# Patient Record
Sex: Male | Born: 1970 | Race: White | Hispanic: No | Marital: Single | Smoking: Former smoker
Health system: Southern US, Community
[De-identification: ages and names within clinical notes are randomized; demographics above are authoritative.]

## PROBLEM LIST (undated history)

## (undated) DIAGNOSIS — F419 Anxiety disorder, unspecified: Secondary | ICD-10-CM

## (undated) DIAGNOSIS — E7800 Pure hypercholesterolemia, unspecified: Secondary | ICD-10-CM

## (undated) DIAGNOSIS — E119 Type 2 diabetes mellitus without complications: Secondary | ICD-10-CM

## (undated) DIAGNOSIS — D649 Anemia, unspecified: Secondary | ICD-10-CM

## (undated) DIAGNOSIS — I2510 Atherosclerotic heart disease of native coronary artery without angina pectoris: Secondary | ICD-10-CM

## (undated) DIAGNOSIS — E785 Hyperlipidemia, unspecified: Secondary | ICD-10-CM

## (undated) DIAGNOSIS — K219 Gastro-esophageal reflux disease without esophagitis: Secondary | ICD-10-CM

## (undated) DIAGNOSIS — G629 Polyneuropathy, unspecified: Secondary | ICD-10-CM

## (undated) DIAGNOSIS — R6889 Other general symptoms and signs: Secondary | ICD-10-CM

## (undated) DIAGNOSIS — I252 Old myocardial infarction: Secondary | ICD-10-CM

## (undated) HISTORY — DX: Pure hypercholesterolemia, unspecified: E78.00

## (undated) HISTORY — DX: Polyneuropathy, unspecified: G62.9

## (undated) HISTORY — DX: Hyperlipidemia, unspecified: E78.5

## (undated) HISTORY — DX: Gastro-esophageal reflux disease without esophagitis: K21.9

## (undated) HISTORY — DX: Old myocardial infarction: I25.2

## (undated) HISTORY — DX: Other general symptoms and signs: R68.89

## (undated) HISTORY — DX: Anemia, unspecified: D64.9

## (undated) HISTORY — DX: Type 2 diabetes mellitus without complications: E11.9

## (undated) HISTORY — PX: ROTATOR CUFF REPAIR: SHX139

## (undated) HISTORY — DX: Anxiety disorder, unspecified: F41.9

---

## 2001-03-15 ENCOUNTER — Encounter: Payer: Self-pay | Admitting: Otolaryngology

## 2001-03-15 ENCOUNTER — Ambulatory Visit (HOSPITAL_COMMUNITY): Admission: RE | Admit: 2001-03-15 | Discharge: 2001-03-15 | Payer: Self-pay | Admitting: Otolaryngology

## 2003-01-03 ENCOUNTER — Emergency Department (HOSPITAL_COMMUNITY): Admission: EM | Admit: 2003-01-03 | Discharge: 2003-01-03 | Payer: Self-pay | Admitting: Emergency Medicine

## 2003-01-21 ENCOUNTER — Encounter: Admission: RE | Admit: 2003-01-21 | Discharge: 2003-04-21 | Payer: Self-pay | Admitting: Family Medicine

## 2003-03-29 ENCOUNTER — Encounter: Admission: RE | Admit: 2003-03-29 | Discharge: 2003-03-29 | Payer: Self-pay | Admitting: Family Medicine

## 2003-04-22 ENCOUNTER — Encounter: Admission: RE | Admit: 2003-04-22 | Discharge: 2003-04-29 | Payer: Self-pay | Admitting: Family Medicine

## 2005-04-29 ENCOUNTER — Ambulatory Visit (HOSPITAL_BASED_OUTPATIENT_CLINIC_OR_DEPARTMENT_OTHER): Admission: RE | Admit: 2005-04-29 | Discharge: 2005-04-29 | Payer: Self-pay | Admitting: Otolaryngology

## 2008-02-15 HISTORY — PX: ROTATOR CUFF REPAIR: SHX139

## 2010-11-08 ENCOUNTER — Ambulatory Visit (HOSPITAL_BASED_OUTPATIENT_CLINIC_OR_DEPARTMENT_OTHER)
Admission: RE | Admit: 2010-11-08 | Discharge: 2010-11-08 | Disposition: A | Discharge status: Home / Self Care | Payer: BC Managed Care – PPO | Source: Ambulatory Visit | Attending: Orthopedic Surgery | Admitting: Orthopedic Surgery

## 2010-11-08 DIAGNOSIS — S43439A Superior glenoid labrum lesion of unspecified shoulder, initial encounter: Secondary | ICD-10-CM | POA: Insufficient documentation

## 2010-11-08 DIAGNOSIS — Z5333 Arthroscopic surgical procedure converted to open procedure: Secondary | ICD-10-CM | POA: Insufficient documentation

## 2010-11-08 DIAGNOSIS — Z01812 Encounter for preprocedural laboratory examination: Secondary | ICD-10-CM | POA: Insufficient documentation

## 2010-11-08 DIAGNOSIS — X58XXXA Exposure to other specified factors, initial encounter: Secondary | ICD-10-CM | POA: Insufficient documentation

## 2010-11-09 LAB — POCT HEMOGLOBIN-HEMACUE: Hemoglobin: 16.7 g/dL (ref 13.0–17.0)

## 2010-11-09 NOTE — Op Note (Signed)
NAMEPAPA, PIERCEFIELD NO.:  [id number]  MEDICAL RECORD NO.:  [id number]  LOCATION:                                 FACILITY:  PHYSICIAN:  Jones Broom, MD         DATE OF BIRTH:  DATE OF PROCEDURE:  11/08/2010 DATE OF DISCHARGE:                              OPERATIVE REPORT   PREOPERATIVE DIAGNOSIS:  Right shoulder superior labral/biceps tear.  POSTOPERATIVE DIAGNOSIS:  Right shoulder superior labral anterior to posterior tear.  PROCEDURE PERFORMED: 1. Right shoulder arthroscopic biceps tenodesis with debridement of     superior labral tear. 2. Open subpectoral biceps tenodesis right shoulder.  ATTENDING SURGEON:  Berline Lopes, MD  ASSISTANT:  None.  ANESTHESIA:  GETA with preoperative interscalene block.  COMPLICATIONS:  None.  DRAINS:  None.  SPECIMENS:  None.  ESTIMATED BLOOD LOSS:  Minimal.  INDICATIONS FOR SURGERY:  The patient is a 40 year old male who presented in March 2012, with right shoulder pain.  Pain has been going on over a year now.  He had excellent relief of his symptoms with an intra-articular injection and physical exam was classic for superior labral pathology.  After failing nonoperative management, he wished to go forward with surgical management understanding risks, benefits and alternatives of the procedure.  We elected to go forgo the MRI given the classic appearance on exam, understanding that he may need further treatment depending on findings at arthroscopy.  OPERATIVE FINDINGS:  Examination under anesthesia demonstrated no stiffness or instability.  Diagnostic arthroscopy revealed an extensive type 2 SLAP extending anterior-posterior with elevation of the biceps root and inflammation of this region with some changes on the superior most aspect of the cartilage on the glenoid rim.  The biceps itself was healthy appearing. No loose bodies were noted.  The rotator cuff was intact.  I did look in the  subacromial space as well rotator cuff was completely intact with no evidence for impingement on the coracoacromial ligament.  I performed arthroscopic biceps tenotomy and then opened subpectoral biceps tenodesis through bone tunnels.  PROCEDURE:  The patient was identified in the preoperative holding area where I personally marked the operative site after verifying site, side, and procedure with the patient.  He was taken back to the operating room after interscalene block was given by the attending anesthesiologist. In the operating room, he had general anesthesia and was placed in a beach-chair position with all extremities carefully padded in position. He did receive IV antibiotics preoperatively.  The right upper extremity was prepped and draped in a standard sterile fashion after examination under anesthesia.  Appropriate time-out procedure was carried out.  A standard posterior portal was established and the arthroscope was introduced into the joint.  An anterior portal was established with needle localization.  A probe was used to enter portal for diagnostic arthroscopy.  He was noted to have extensive superior labral tear falling into the joint.  The superior surface of the glenoid tubercle was denuded and had some small changes on the upper cartilage as well. Given the findings which were expected, I performed the biceps tenotomy through the anterior portal with a large  biter and then cleaned up the remaining labrum with a shaver.  I also shaved the border of the articular cartilage down to a stable base.  At the conclusion of the debridement, the rim was stable and not subluxating into the joint.  Not feel that any labral repair was necessary with the biceps tenodesis from the remainder of the diagnostic arthroscopy.  The subscapularis was completely intact.  Glenohumeral joint surfaces were intact.  The labrum anterior and posterior was intact and no loose bodies were noted.   The undersurface of the rotator cuff anterior and posterior was intact.  I then placed the scope into the subacromial space and a small lateral portal under needle localization.  Introduced shaver into the joint and did a gentle bursectomy to examine the bursal side of the rotator cuff, which was completely intact, mild fraying anteriorly.  No significant fraying or tearing of the coracoacromial ligament.  The scope was then removed and approximately 3-cm incision was made centered over the inferior border of the pectoralis major.  Dissection was carried down to the interval between the pectoralis major and short head of the biceps. Through the center, I was able to retrieve the long head of the biceps and prepare the inferior bicipital groove for insertion.  I used a FiberLoop suture to prepare the tendon and excised and discarded the excess tendon after appropriate tensioning.  This was at approximately the musculotendinous junction.  A drill that was selected, which was slightly smaller than the prepared tendon for the proximal hole, which was drilled in the bicipital groove and an approximately 3-mm drill was used for the distal hole 1.5 cm away.  The crescent suture lasso was then used through the distal hole with the suture retrieved proximally, which was then used in a loop to pass the sutures from the biceps tendon down and through the tunnels.  One suture was retrieved medial and lateral to the tendon and the tendon was then dunked into the proximal hole with approximately 1.5 cm of the tendon inserting into the intramedullary canal.  I then tied the suture over top of the biceps tendon and cut the suture.  Excellent tension was noted.  I then copiously irrigated the wound and subsequently closed in layers with 2-0 Vicryl in deep dermal layer, 4-0 Monocryl for skin closure, and Dermabond on the skin.  I then closed the arthroscopic portals with 4-0 Monocryl sutures.  Sterile  dressings were then applied including Xeroform, 4x4s, ABDs, and tape.  The patient was placed in a sling, allowed to awaken from general anesthesia, transferred to the stretcher, and taken to the recovery room in stable condition.  POSTOPERATIVE PLAN:  He will be discharged in a sling.  He can come out of the sling in the next few days, begin to use the arm more but he will have no active flexion of the elbow for approximately 4 weeks to let the biceps heal.  He will follow up with me in 1 week.  He is discharged with Percocet for pain control.     Jones Broom, MD     JC/MEDQ  D:  11/08/2010  T:  11/08/2010  Job:  [id number]  Electronically Signed by Jones Broom  on 11/09/2010 02:02:50 PM

## 2019-06-18 DIAGNOSIS — M542 Cervicalgia: Secondary | ICD-10-CM | POA: Diagnosis not present

## 2019-07-09 ENCOUNTER — Inpatient Hospital Stay (HOSPITAL_COMMUNITY)
Admission: EM | Disposition: A | Discharge status: Home / Self Care | Payer: Self-pay | Source: Home / Self Care | Attending: Internal Medicine

## 2019-07-09 ENCOUNTER — Inpatient Hospital Stay (HOSPITAL_COMMUNITY)
Admission: EM | Admit: 2019-07-09 | Discharge: 2019-07-11 | DRG: 247 | Disposition: A | Discharge status: Home / Self Care | Payer: BC Managed Care – PPO | Attending: Internal Medicine | Admitting: Internal Medicine

## 2019-07-09 ENCOUNTER — Emergency Department (HOSPITAL_COMMUNITY): Payer: BC Managed Care – PPO

## 2019-07-09 ENCOUNTER — Other Ambulatory Visit: Payer: Self-pay

## 2019-07-09 ENCOUNTER — Inpatient Hospital Stay (HOSPITAL_COMMUNITY): Payer: BC Managed Care – PPO

## 2019-07-09 ENCOUNTER — Encounter (HOSPITAL_COMMUNITY): Payer: Self-pay | Admitting: Emergency Medicine

## 2019-07-09 DIAGNOSIS — N3289 Other specified disorders of bladder: Secondary | ICD-10-CM | POA: Diagnosis not present

## 2019-07-09 DIAGNOSIS — E785 Hyperlipidemia, unspecified: Secondary | ICD-10-CM | POA: Diagnosis not present

## 2019-07-09 DIAGNOSIS — K2100 Gastro-esophageal reflux disease with esophagitis, without bleeding: Secondary | ICD-10-CM | POA: Diagnosis present

## 2019-07-09 DIAGNOSIS — R0602 Shortness of breath: Secondary | ICD-10-CM | POA: Diagnosis not present

## 2019-07-09 DIAGNOSIS — R079 Chest pain, unspecified: Secondary | ICD-10-CM | POA: Insufficient documentation

## 2019-07-09 DIAGNOSIS — Z7982 Long term (current) use of aspirin: Secondary | ICD-10-CM | POA: Diagnosis not present

## 2019-07-09 DIAGNOSIS — R000 Tachycardia, unspecified: Secondary | ICD-10-CM | POA: Diagnosis present

## 2019-07-09 DIAGNOSIS — F419 Anxiety disorder, unspecified: Secondary | ICD-10-CM | POA: Diagnosis not present

## 2019-07-09 DIAGNOSIS — I214 Non-ST elevation (NSTEMI) myocardial infarction: Secondary | ICD-10-CM | POA: Diagnosis not present

## 2019-07-09 DIAGNOSIS — K219 Gastro-esophageal reflux disease without esophagitis: Secondary | ICD-10-CM | POA: Diagnosis present

## 2019-07-09 DIAGNOSIS — I2510 Atherosclerotic heart disease of native coronary artery without angina pectoris: Secondary | ICD-10-CM

## 2019-07-09 DIAGNOSIS — I249 Acute ischemic heart disease, unspecified: Secondary | ICD-10-CM | POA: Diagnosis present

## 2019-07-09 DIAGNOSIS — R739 Hyperglycemia, unspecified: Secondary | ICD-10-CM | POA: Diagnosis not present

## 2019-07-09 DIAGNOSIS — I25110 Atherosclerotic heart disease of native coronary artery with unstable angina pectoris: Secondary | ICD-10-CM | POA: Diagnosis not present

## 2019-07-09 DIAGNOSIS — Z20822 Contact with and (suspected) exposure to covid-19: Secondary | ICD-10-CM | POA: Diagnosis not present

## 2019-07-09 DIAGNOSIS — M62838 Other muscle spasm: Secondary | ICD-10-CM | POA: Diagnosis present

## 2019-07-09 DIAGNOSIS — M542 Cervicalgia: Secondary | ICD-10-CM | POA: Diagnosis not present

## 2019-07-09 DIAGNOSIS — R778 Other specified abnormalities of plasma proteins: Secondary | ICD-10-CM | POA: Diagnosis present

## 2019-07-09 DIAGNOSIS — I2584 Coronary atherosclerosis due to calcified coronary lesion: Secondary | ICD-10-CM | POA: Diagnosis present

## 2019-07-09 DIAGNOSIS — F17200 Nicotine dependence, unspecified, uncomplicated: Secondary | ICD-10-CM | POA: Diagnosis present

## 2019-07-09 DIAGNOSIS — Z8249 Family history of ischemic heart disease and other diseases of the circulatory system: Secondary | ICD-10-CM

## 2019-07-09 DIAGNOSIS — E1165 Type 2 diabetes mellitus with hyperglycemia: Secondary | ICD-10-CM | POA: Diagnosis not present

## 2019-07-09 HISTORY — PX: CORONARY STENT INTERVENTION: CATH118234

## 2019-07-09 HISTORY — DX: Atherosclerotic heart disease of native coronary artery without angina pectoris: I25.10

## 2019-07-09 HISTORY — PX: LEFT HEART CATH AND CORONARY ANGIOGRAPHY: CATH118249

## 2019-07-09 LAB — CBC
HCT: 42.9 % (ref 39.0–52.0)
Hemoglobin: 13.1 g/dL (ref 13.0–17.0)
MCH: 22.7 pg — ABNORMAL LOW (ref 26.0–34.0)
MCHC: 30.5 g/dL (ref 30.0–36.0)
MCV: 74.2 fL — ABNORMAL LOW (ref 80.0–100.0)
Platelets: 384 10*3/uL (ref 150–400)
RBC: 5.78 MIL/uL (ref 4.22–5.81)
RDW: 17.7 % — ABNORMAL HIGH (ref 11.5–15.5)
WBC: 10.1 10*3/uL (ref 4.0–10.5)
nRBC: 0 % (ref 0.0–0.2)

## 2019-07-09 LAB — CBG MONITORING, ED
Glucose-Capillary: 295 mg/dL — ABNORMAL HIGH (ref 70–99)
Glucose-Capillary: 196 mg/dL — ABNORMAL HIGH (ref 70–99)

## 2019-07-09 LAB — BASIC METABOLIC PANEL
Anion gap: 13 (ref 5–15)
BUN: 13 mg/dL (ref 6–20)
CO2: 20 mmol/L — ABNORMAL LOW (ref 22–32)
Calcium: 8.9 mg/dL (ref 8.9–10.3)
Chloride: 103 mmol/L (ref 98–111)
Creatinine, Ser: 0.76 mg/dL (ref 0.61–1.24)
GFR calc Af Amer: >60 mL/min (ref >60)
GFR calc non Af Amer: >60 mL/min (ref >60)
Glucose, Bld: 369 mg/dL — ABNORMAL HIGH (ref 70–99)
Potassium: 4.1 mmol/L (ref 3.5–5.1)
Sodium: 136 mmol/L (ref 135–145)

## 2019-07-09 LAB — SARS CORONAVIRUS 2 BY RT PCR (HOSPITAL ORDER, PERFORMED IN ~~LOC~~ HOSPITAL LAB): SARS Coronavirus 2: NEGATIVE

## 2019-07-09 LAB — HEMOGLOBIN A1C
Hgb A1c MFr Bld: 11.3 % — ABNORMAL HIGH (ref 4.8–5.6)
Mean Plasma Glucose: 277.61 mg/dL

## 2019-07-09 LAB — LIPID PANEL
Cholesterol: 265 mg/dL — ABNORMAL HIGH (ref 0–200)
HDL: 48 mg/dL (ref >40)
LDL Cholesterol: 202 mg/dL — ABNORMAL HIGH (ref 0–99)
Total CHOL/HDL Ratio: 5.5 RATIO
Triglycerides: 74 mg/dL (ref <150)
VLDL: 15 mg/dL (ref 0–40)

## 2019-07-09 LAB — TROPONIN I (HIGH SENSITIVITY)
Troponin I (High Sensitivity): 5 ng/L (ref <18)
Troponin I (High Sensitivity): 23 ng/L — ABNORMAL HIGH (ref <18)
Troponin I (High Sensitivity): 155 ng/L (ref <18)
Troponin I (High Sensitivity): 420 ng/L (ref <18)

## 2019-07-09 LAB — MRSA PCR SCREENING: MRSA by PCR: NEGATIVE

## 2019-07-09 LAB — GLUCOSE, CAPILLARY
Glucose-Capillary: 330 mg/dL — ABNORMAL HIGH (ref 70–99)
Glucose-Capillary: 258 mg/dL — ABNORMAL HIGH (ref 70–99)

## 2019-07-09 SURGERY — LEFT HEART CATH AND CORONARY ANGIOGRAPHY
Anesthesia: LOCAL

## 2019-07-09 MED ORDER — HEPARIN SODIUM (PORCINE) 1000 UNIT/ML IJ SOLN
INTRAMUSCULAR | Status: AC
  Filled 2019-07-09: qty 1

## 2019-07-09 MED ORDER — ALUM & MAG HYDROXIDE-SIMETH 200-200-20 MG/5ML PO SUSP
30.0000 mL | Freq: Once | ORAL | Status: AC
  Administered 2019-07-09: 30 mL via ORAL
  Filled 2019-07-09: qty 30

## 2019-07-09 MED ORDER — SODIUM CHLORIDE 0.9% FLUSH
3.0000 mL | Freq: Once | INTRAVENOUS | Status: AC
  Administered 2019-07-09: 3 mL via INTRAVENOUS

## 2019-07-09 MED ORDER — TICAGRELOR 90 MG PO TABS
ORAL_TABLET | ORAL | Status: DC | PRN
  Administered 2019-07-09: 180 mg via ORAL

## 2019-07-09 MED ORDER — VERAPAMIL HCL 2.5 MG/ML IV SOLN
INTRAVENOUS | Status: DC | PRN
  Administered 2019-07-09: 10 mL via INTRA_ARTERIAL

## 2019-07-09 MED ORDER — METOPROLOL TARTRATE 50 MG PO TABS: 100.0000 mg | ORAL_TABLET | Freq: Once | ORAL | Status: DC

## 2019-07-09 MED ORDER — HEPARIN (PORCINE) 25000 UT/250ML-% IV SOLN
900.0000 [IU]/h | INTRAVENOUS | Status: DC
  Administered 2019-07-09: 900 [IU]/h via INTRAVENOUS
  Filled 2019-07-09: qty 250

## 2019-07-09 MED ORDER — SODIUM CHLORIDE 0.9 % WEIGHT BASED INFUSION
1.0000 mL/kg/h | INTRAVENOUS | Status: DC
  Administered 2019-07-09: 1 mL/kg/h via INTRAVENOUS

## 2019-07-09 MED ORDER — SODIUM CHLORIDE 0.9 % IV SOLN
INTRAVENOUS | Status: AC | PRN
  Administered 2019-07-09: 50 mL/h via INTRAVENOUS

## 2019-07-09 MED ORDER — HEPARIN (PORCINE) IN NACL 1000-0.9 UT/500ML-% IV SOLN
INTRAVENOUS | Status: AC
  Filled 2019-07-09: qty 1000

## 2019-07-09 MED ORDER — SUCRALFATE 1 GM/10ML PO SUSP
1.0000 g | Freq: Four times a day (QID) | ORAL | Status: DC
  Administered 2019-07-09 – 2019-07-11 (×7): 1 g via ORAL
  Filled 2019-07-09 (×9): qty 10

## 2019-07-09 MED ORDER — SODIUM CHLORIDE 0.9 % IV SOLN: 250.0000 mL | INTRAVENOUS | Status: DC | PRN

## 2019-07-09 MED ORDER — METOPROLOL TARTRATE 5 MG/5ML IV SOLN
5.0000 mg | Freq: Once | INTRAVENOUS | Status: AC
  Administered 2019-07-09: 5 mg via INTRAVENOUS
  Filled 2019-07-09: qty 5

## 2019-07-09 MED ORDER — ONDANSETRON HCL 4 MG/2ML IJ SOLN: 4.0000 mg | Freq: Four times a day (QID) | INTRAMUSCULAR | Status: DC | PRN

## 2019-07-09 MED ORDER — HEPARIN BOLUS VIA INFUSION
4000.0000 [IU] | Freq: Once | INTRAVENOUS | Status: AC
  Administered 2019-07-09: 4000 [IU] via INTRAVENOUS
  Filled 2019-07-09: qty 4000

## 2019-07-09 MED ORDER — SODIUM CHLORIDE 0.9% FLUSH: 3.0000 mL | INTRAVENOUS | Status: DC | PRN

## 2019-07-09 MED ORDER — SODIUM CHLORIDE 0.9 % IV SOLN: INTRAVENOUS | Status: AC

## 2019-07-09 MED ORDER — LORAZEPAM 2 MG/ML IJ SOLN
1.0000 mg | Freq: Once | INTRAMUSCULAR | Status: AC
  Administered 2019-07-09: 1 mg via INTRAVENOUS
  Filled 2019-07-09: qty 1

## 2019-07-09 MED ORDER — TICAGRELOR 90 MG PO TABS
ORAL_TABLET | ORAL | Status: AC
  Filled 2019-07-09: qty 2

## 2019-07-09 MED ORDER — HYDRALAZINE HCL 20 MG/ML IJ SOLN: 10.0000 mg | INTRAMUSCULAR | Status: AC | PRN

## 2019-07-09 MED ORDER — IOHEXOL 350 MG/ML SOLN
INTRAVENOUS | Status: DC | PRN
  Administered 2019-07-09: 180 mL

## 2019-07-09 MED ORDER — HEPARIN SODIUM (PORCINE) 5000 UNIT/ML IJ SOLN
5000.0000 [IU] | Freq: Three times a day (TID) | INTRAMUSCULAR | Status: DC
  Administered 2019-07-10 – 2019-07-11 (×4): 5000 [IU] via SUBCUTANEOUS
  Filled 2019-07-09 (×4): qty 1

## 2019-07-09 MED ORDER — IOHEXOL 350 MG/ML SOLN
100.0000 mL | Freq: Once | INTRAVENOUS | Status: AC | PRN
  Administered 2019-07-09: 100 mL via INTRAVENOUS

## 2019-07-09 MED ORDER — LIDOCAINE HCL (PF) 1 % IJ SOLN
INTRAMUSCULAR | Status: AC
  Filled 2019-07-09: qty 30

## 2019-07-09 MED ORDER — MIDAZOLAM HCL 2 MG/2ML IJ SOLN
INTRAMUSCULAR | Status: DC | PRN
  Administered 2019-07-09: 1 mg via INTRAVENOUS

## 2019-07-09 MED ORDER — HEPARIN (PORCINE) IN NACL 1000-0.9 UT/500ML-% IV SOLN
INTRAVENOUS | Status: DC | PRN
  Administered 2019-07-09: 500 mL

## 2019-07-09 MED ORDER — ETOMIDATE 2 MG/ML IV SOLN
INTRAVENOUS | Status: AC | PRN
  Administered 2019-07-09: 6 mg via INTRAVENOUS

## 2019-07-09 MED ORDER — POTASSIUM CHLORIDE IN NACL 20-0.9 MEQ/L-% IV SOLN
INTRAVENOUS | Status: AC
  Filled 2019-07-09 (×2): qty 1000

## 2019-07-09 MED ORDER — TIROFIBAN HCL IN NACL 5-0.9 MG/100ML-% IV SOLN
INTRAVENOUS | Status: AC
  Filled 2019-07-09: qty 100

## 2019-07-09 MED ORDER — SODIUM CHLORIDE 0.9% FLUSH
3.0000 mL | Freq: Two times a day (BID) | INTRAVENOUS | Status: DC
  Administered 2019-07-09: 3 mL via INTRAVENOUS

## 2019-07-09 MED ORDER — FENTANYL CITRATE (PF) 100 MCG/2ML IJ SOLN
12.5000 ug | INTRAMUSCULAR | Status: DC | PRN
  Filled 2019-07-09: qty 2

## 2019-07-09 MED ORDER — TICAGRELOR 90 MG PO TABS
90.0000 mg | ORAL_TABLET | Freq: Two times a day (BID) | ORAL | Status: DC
  Administered 2019-07-09 – 2019-07-11 (×4): 90 mg via ORAL
  Filled 2019-07-09 (×4): qty 1

## 2019-07-09 MED ORDER — TECHNETIUM TC 99M TETROFOSMIN IV KIT
10.0000 | PACK | Freq: Once | INTRAVENOUS | Status: AC | PRN
  Administered 2019-07-09: 10 via INTRAVENOUS

## 2019-07-09 MED ORDER — REGADENOSON 0.4 MG/5ML IV SOLN
INTRAVENOUS | Status: AC
  Filled 2019-07-09: qty 5

## 2019-07-09 MED ORDER — PANTOPRAZOLE SODIUM 40 MG IV SOLR
40.0000 mg | Freq: Once | INTRAVENOUS | Status: AC
  Administered 2019-07-09: 40 mg via INTRAVENOUS
  Filled 2019-07-09: qty 40

## 2019-07-09 MED ORDER — ASPIRIN EC 81 MG PO TBEC
81.0000 mg | DELAYED_RELEASE_TABLET | Freq: Every day | ORAL | Status: DC
  Filled 2019-07-09: qty 1

## 2019-07-09 MED ORDER — ASPIRIN 81 MG PO CHEW
81.0000 mg | CHEWABLE_TABLET | ORAL | Status: DC
Start: 2019-07-10 — End: 2019-07-09

## 2019-07-09 MED ORDER — ATORVASTATIN CALCIUM 80 MG PO TABS
80.0000 mg | ORAL_TABLET | Freq: Every day | ORAL | Status: DC
  Administered 2019-07-09 – 2019-07-11 (×3): 80 mg via ORAL
  Filled 2019-07-09 (×3): qty 1

## 2019-07-09 MED ORDER — LABETALOL HCL 5 MG/ML IV SOLN: 10.0000 mg | INTRAVENOUS | Status: AC | PRN

## 2019-07-09 MED ORDER — HEPARIN SODIUM (PORCINE) 5000 UNIT/ML IJ SOLN: 5000.0000 [IU] | Freq: Three times a day (TID) | INTRAMUSCULAR | Status: DC

## 2019-07-09 MED ORDER — FAMOTIDINE IN NACL 20-0.9 MG/50ML-% IV SOLN: 20.0000 mg | INTRAVENOUS | Status: DC

## 2019-07-09 MED ORDER — NITROGLYCERIN IN D5W 200-5 MCG/ML-% IV SOLN
0.0000 ug/min | INTRAVENOUS | Status: DC
  Administered 2019-07-09: 5 ug/min via INTRAVENOUS
  Filled 2019-07-09: qty 250

## 2019-07-09 MED ORDER — LIDOCAINE VISCOUS HCL 2 % MT SOLN
15.0000 mL | Freq: Once | OROMUCOSAL | Status: AC
  Administered 2019-07-09: 15 mL via ORAL
  Filled 2019-07-09: qty 15

## 2019-07-09 MED ORDER — SODIUM CHLORIDE 0.9% FLUSH
3.0000 mL | Freq: Two times a day (BID) | INTRAVENOUS | Status: DC
  Administered 2019-07-09 – 2019-07-10 (×3): 3 mL via INTRAVENOUS

## 2019-07-09 MED ORDER — VERAPAMIL HCL 2.5 MG/ML IV SOLN
INTRAVENOUS | Status: AC
  Filled 2019-07-09: qty 2

## 2019-07-09 MED ORDER — FENTANYL CITRATE (PF) 100 MCG/2ML IJ SOLN
25.0000 ug | Freq: Once | INTRAMUSCULAR | Status: AC
  Administered 2019-07-09: 25 ug via INTRAVENOUS

## 2019-07-09 MED ORDER — PANTOPRAZOLE SODIUM 40 MG IV SOLR
40.0000 mg | Freq: Two times a day (BID) | INTRAVENOUS | Status: DC
  Administered 2019-07-09 – 2019-07-11 (×4): 40 mg via INTRAVENOUS
  Filled 2019-07-09 (×4): qty 40

## 2019-07-09 MED ORDER — MORPHINE SULFATE (PF) 4 MG/ML IV SOLN
4.0000 mg | Freq: Once | INTRAVENOUS | Status: AC
  Administered 2019-07-09: 4 mg via INTRAVENOUS
  Filled 2019-07-09: qty 1

## 2019-07-09 MED ORDER — FENTANYL CITRATE (PF) 100 MCG/2ML IJ SOLN
INTRAMUSCULAR | Status: AC
  Filled 2019-07-09: qty 2

## 2019-07-09 MED ORDER — ONDANSETRON HCL 4 MG/2ML IJ SOLN
4.0000 mg | Freq: Once | INTRAMUSCULAR | Status: AC
  Administered 2019-07-09: 4 mg via INTRAVENOUS
  Filled 2019-07-09: qty 2

## 2019-07-09 MED ORDER — INSULIN ASPART 100 UNIT/ML ~~LOC~~ SOLN
0.0000 [IU] | Freq: Three times a day (TID) | SUBCUTANEOUS | Status: DC
  Administered 2019-07-09: 2 [IU] via SUBCUTANEOUS
  Administered 2019-07-09: 5 [IU] via SUBCUTANEOUS
  Administered 2019-07-10: 2 [IU] via SUBCUTANEOUS
  Administered 2019-07-10 (×2): 3 [IU] via SUBCUTANEOUS
  Administered 2019-07-11: 2 [IU] via SUBCUTANEOUS

## 2019-07-09 MED ORDER — HEPARIN SODIUM (PORCINE) 1000 UNIT/ML IJ SOLN
INTRAMUSCULAR | Status: DC | PRN
  Administered 2019-07-09: 4000 [IU] via INTRAVENOUS
  Administered 2019-07-09: 5000 [IU] via INTRAVENOUS
  Administered 2019-07-09: 2500 [IU] via INTRAVENOUS

## 2019-07-09 MED ORDER — MIDAZOLAM HCL 2 MG/2ML IJ SOLN
INTRAMUSCULAR | Status: AC
  Filled 2019-07-09: qty 2

## 2019-07-09 MED ORDER — ASPIRIN 81 MG PO CHEW
81.0000 mg | CHEWABLE_TABLET | Freq: Every day | ORAL | Status: DC
  Administered 2019-07-10 – 2019-07-11 (×2): 81 mg via ORAL
  Filled 2019-07-09 (×2): qty 1

## 2019-07-09 MED ORDER — LIDOCAINE HCL (PF) 1 % IJ SOLN
INTRAMUSCULAR | Status: DC | PRN
  Administered 2019-07-09: 2 mL

## 2019-07-09 MED ORDER — TIROFIBAN (AGGRASTAT) BOLUS VIA INFUSION
INTRAVENOUS | Status: DC | PRN
  Administered 2019-07-09: 1927.5 ug via INTRAVENOUS

## 2019-07-09 MED ORDER — ACETAMINOPHEN 325 MG PO TABS: 650.0000 mg | ORAL_TABLET | ORAL | Status: DC | PRN

## 2019-07-09 MED ORDER — ASPIRIN 81 MG PO CHEW
324.0000 mg | CHEWABLE_TABLET | Freq: Once | ORAL | Status: AC
  Administered 2019-07-09: 243 mg via ORAL
  Filled 2019-07-09: qty 4

## 2019-07-09 MED ORDER — FENTANYL CITRATE (PF) 100 MCG/2ML IJ SOLN
INTRAMUSCULAR | Status: DC | PRN
  Administered 2019-07-09 (×2): 25 ug via INTRAVENOUS

## 2019-07-09 MED ORDER — METOPROLOL TARTRATE 25 MG PO TABS
25.0000 mg | ORAL_TABLET | Freq: Two times a day (BID) | ORAL | Status: DC
  Administered 2019-07-09: 25 mg via ORAL
  Filled 2019-07-09: qty 1

## 2019-07-09 MED ORDER — NITROGLYCERIN 1 MG/10 ML FOR IR/CATH LAB
INTRA_ARTERIAL | Status: AC
  Filled 2019-07-09: qty 10

## 2019-07-09 MED ORDER — ATORVASTATIN CALCIUM 40 MG PO TABS: 40.0000 mg | ORAL_TABLET | Freq: Every day | ORAL | Status: DC

## 2019-07-09 MED ORDER — PANTOPRAZOLE SODIUM 40 MG IV SOLR: 40.0000 mg | Freq: Once | INTRAVENOUS | Status: DC

## 2019-07-09 MED ORDER — SODIUM CHLORIDE 0.9 % WEIGHT BASED INFUSION
3.0000 mL/kg/h | INTRAVENOUS | Status: DC
  Administered 2019-07-09: 3 mL/kg/h via INTRAVENOUS

## 2019-07-09 MED ORDER — ASPIRIN 81 MG PO CHEW
81.0000 mg | CHEWABLE_TABLET | ORAL | Status: AC
  Administered 2019-07-09: 81 mg via ORAL
  Filled 2019-07-09: qty 1

## 2019-07-09 MED ORDER — NITROGLYCERIN 0.4 MG SL SUBL
0.4000 mg | SUBLINGUAL_TABLET | SUBLINGUAL | Status: DC | PRN
  Administered 2019-07-09 (×2): 0.4 mg via SUBLINGUAL
  Filled 2019-07-09 (×2): qty 1

## 2019-07-09 MED ORDER — NITROGLYCERIN 1 MG/10 ML FOR IR/CATH LAB
INTRA_ARTERIAL | Status: DC | PRN
  Administered 2019-07-09 (×2): 100 ug

## 2019-07-09 SURGICAL SUPPLY — 19 items
BALLN EMERGE MR 2.25X12 (BALLOONS) ×2
BALLOON EMERGE MR 2.25X12 (BALLOONS) IMPLANT
CATH 5FR JL3.5 JR4 ANG PIG MP (CATHETERS) ×1 IMPLANT
CATH LAUNCHER 5F RADR (CATHETERS) IMPLANT
CATH VISTA GUIDE 6FR XB3.5 (CATHETERS) ×1 IMPLANT
CATHETER LAUNCHER 5F RADR (CATHETERS) ×2
DEVICE RAD COMP TR BAND LRG (VASCULAR PRODUCTS) ×1 IMPLANT
GLIDESHEATH SLEND A-KIT 6F 22G (SHEATH) ×1 IMPLANT
GUIDEWIRE INQWIRE 1.5J.035X260 (WIRE) IMPLANT
INQWIRE 1.5J .035X260CM (WIRE) ×2
KIT ENCORE 26 ADVANTAGE (KITS) ×1 IMPLANT
KIT ESSENTIALS PG (KITS) ×1 IMPLANT
KIT HEART LEFT (KITS) ×2 IMPLANT
PACK CARDIAC CATHETERIZATION (CUSTOM PROCEDURE TRAY) ×2 IMPLANT
SHEATH PROBE COVER 6X72 (BAG) ×1 IMPLANT
STENT RESOLUTE ONYX 2.75X15 (Permanent Stent) ×1 IMPLANT
TRANSDUCER W/STOPCOCK (MISCELLANEOUS) ×2 IMPLANT
TUBING CIL FLEX 10 FLL-RA (TUBING) ×2 IMPLANT
WIRE ASAHI PROWATER 180CM (WIRE) ×1 IMPLANT

## 2019-07-09 NOTE — ED Notes (Signed)
Pt st's pain is a 9 on scale 0/10  NTG gtt increased to 62mcg/min

## 2019-07-09 NOTE — Progress Notes (Signed)
Patient with ongoing pain  ECG normal  Troponin increased to 420 Will arrange heart catheterization today next case Continue heparin and nitro  Charlton Haws MD [facility]

## 2019-07-09 NOTE — ED Notes (Signed)
Pt continues to endorse 8/10 CP. Nitro drip increased to 20.

## 2019-07-09 NOTE — ED Triage Notes (Signed)
Pt reports left sided chest pain that "feels like a elephant sitting on my chest."  Pt is very diaphoretic in triage.  No hx.

## 2019-07-09 NOTE — ED Notes (Signed)
Pt reports one baby aspirin taken at home PTA. Will administer additional 243 mg.

## 2019-07-09 NOTE — ED Notes (Signed)
Pt st's chest pain now is a #8 on pain scale 0/10  Describes as a pressure

## 2019-07-09 NOTE — Progress Notes (Signed)
Pt was admitted earlier this am by Dr Olevia Bowens, please see his note for detailed H&P.  49 year old gentleman with prior history of hyper lipidemia, GERD presents to ED for evaluation of chest pain started yesterday evening while watching TV.  On arrival to ED he was tachypneic, tachycardic.  EKG shows sinus rhythm.  CT angiogram of the chest and abdomen, pelvis showed no evidence of dissection or pulmonary embolus , showed Diffuse esophageal wall thickening extending from the mid esophagus to the gastroesophageal junction. Findings may represent reflux or esophagitis.  Troponin's elevated at 155 and 420. Cardiology was consulted and patient was started on IV heparin and IV nitroglycerin, IV Morphine.  Pt was scheduled for stress test later today as his CT shows minimal coronary calcium.     Pt seen and examined at bedside.   He is alert and mild discomfort from chest pain. After morphine and IV NTG, pt reports its dull pain with radiation to the left shoulder.  CVS s1s2 RRR, no JVD, no pedal edema , no murmers Lungs: clear to auscultation, no wheezing or rhonchi, tachypnea present.  Abdomen : soft , mild tenderness in the epigastric area, non distended, bs+ Extremities: No pedal edema, cyanosis or clubbing.  Neuro:  Pt is alert, oriented, able to move all extremities, grossly non focal.    Plan:   Chest pain/ Unstable angina:  Resume IV heparin, IV NTG, further recommendations as per cardiology.  Resume aspirin, lipitor and metoprolol,.    Diabetes mellitus:  Will get DM coordinator consult.  Start him on SSI.  A1c is 11.3. will start him on low dose lantus tonight.    Hyperlipidemia: Resume statin. LDL is 202, Triglycerides 74.    GERD/ Esophagitis:  Started him on IV protonix and sucralfate.  Will probably need GI consult for EGD for evaluation of PUD after cardiology work up.   Shakenya Stoneberg,MD

## 2019-07-09 NOTE — ED Notes (Signed)
Pt st's chest pain is now a 7 3/4 on pain scale 0/10  NTG gtt increased to 18mccg/min

## 2019-07-09 NOTE — H&P (Signed)
History and Physical    Shawn Landry QQI:[id number] DOB: 12-Dec-1970 DOA: 07/09/2019  PCP: Shawn Landry, No Pcp Per  Shawn Landry coming from: Home I have personally briefly reviewed Shawn Landry's old medical records in [facility]  Chief Complaint: Chest pressure   HPI: Shawn Landry is a 49 y.o. male with past medical history of hyperlipidemia, gastroesophageal reflux disease who came to hospital tonight for evaluation of chest pain. Shawn Landry stated that he was in usual state of health until last evening, when around of 9 PM, while he was watching TV, he quickly developed left-sided chest pressure, about 10/10, without radiation, described like elephant sitting on the chest. The pain was constant, level was fluctuating, but never went away.  Shawn Landry reported mild shortness of breath, intermittent nausea and increased anxiety.  Shawn Landry does not have any previous cardiac history.  He is a former smoker, quit about 10 years ago.  Denies use of any illicit drugs, specifically cocaine or alcohol.  Report history of sudden death, at his brother at age of 29, presumably due to cardiac event.  Due to ongoing chest pain, Shawn Landry decided come to ED for evaluation.  In the ED Shawn Landry was afebrile temperature 97.6 F.  Blood pressure was slightly elevated 150/88, without tachycardia, 92/min. Shawn Landry was slightly tachypneic, up to 25 breaths/min, but was not hypoxic. CBC showed no leukocytosis or anemia. Chemistry showed only elevated glucose to 369, with Shawn Landry having no prior history of diabetes mellitus. Noted elevation of troponin, initially normal at 5, then 23, with delta 19. EKG showed T wave inversion V1 and V2 and of aVR, otherwise no acute ST elevation or depression. CT angio chest abdomen showed no evidence of aortic dissection or any other vascular abnormalities  Shawn Landry was treated with aspirin, started on heparin drip as well as nitroglycerin drip with minimal improvement. Shawn Landry did received a GI  cocktail which brought no specific relief. Cardiology consult was requested and pending at the time of dictation.  Shawn Landry will be admitted to stepdown unit for further evaluation and treatment  Review of Systems  Constitutional: Negative for chills, diaphoresis and fever.  HENT: Negative for ear pain, hearing loss and tinnitus.   Eyes: Negative for blurred vision.  Respiratory: Positive for shortness of breath. Negative for cough, sputum production and wheezing.   Cardiovascular: Positive for chest pain. Negative for palpitations, orthopnea, claudication, leg swelling and PND.  Gastrointestinal: Positive for heartburn and nausea. Negative for abdominal pain, constipation, diarrhea and vomiting.  Genitourinary: Negative for dysuria, frequency and urgency.  Musculoskeletal: Negative for back pain, joint pain and myalgias.  Neurological: Negative for dizziness, sensory change, focal weakness and headaches.  Endo/Heme/Allergies: Does not bruise/bleed easily.  Psychiatric/Behavioral: Negative for depression and substance abuse. The Shawn Landry is not nervous/anxious.     As per HPI otherwise all other systems reviewed and are negative.  Past Medical History:  Diagnosis Date  . GERD (gastroesophageal reflux disease)   . Hyperlipidemia     History reviewed. No pertinent surgical history.  Social History  reports that he quit smoking about 11 years ago. He has never used smokeless tobacco. He reports current alcohol use. He reports that he does not use drugs.  No Known Allergies  Family History  Problem Relation Age of Onset  . Sudden Cardiac Death Brother 51   Prior to Admission medications   Medication Sig Start Date End Date Taking? Authorizing Provider  aspirin EC 81 MG tablet Take 81 mg by mouth once a week.  Yes [provider]  meloxicam (MOBIC) 15 MG tablet Take 15 mg by mouth daily as needed for muscle spasms. 06/18/19  Yes [provider]   Physical  Exam: Vitals:   07/09/19 0200 07/09/19 0215 07/09/19 0230 07/09/19 0345  BP: (!) 133/94 123/88 (!) 121/94 (!) 119/92  Pulse: 92 90 90 77  Resp: (!) 22 (!) 22 20 (!) 22  Temp:      TempSrc:      SpO2: 100% 100% 100% 99%  Weight:      Height:        Constitutional: Appears to be uncomfortable, anxious due to ongoing chest pain Vitals:   07/09/19 0200 07/09/19 0215 07/09/19 0230 07/09/19 0345  BP: (!) 133/94 123/88 (!) 121/94 (!) 119/92  Pulse: 92 90 90 77  Resp: (!) 22 (!) 22 20 (!) 22  Temp:      TempSrc:      SpO2: 100% 100% 100% 99%  Weight:      Height:       Eyes: PERRL, lids and conjunctivae normal ENMT: Mucous membranes are moist. Posterior pharynx clear of any exudate or lesions.Normal dentition.  Neck: normal, supple, no masses, no thyromegaly Respiratory: clear to auscultation bilaterally, no wheezing, no crackles. Normal respiratory effort. No accessory muscle use.  Cardiovascular: Regular rate and rhythm, no murmurs / rubs / gallops. No extremity edema. 2+ pedal pulses. No carotid bruits.  Abdomen: no tenderness, no masses palpated. No hepatosplenomegaly. Bowel sounds positive.  Musculoskeletal: no clubbing / cyanosis. No joint deformity upper and lower extremities. Good ROM, no contractures. Normal muscle tone.  Skin: no rashes, lesions, ulcers. No induration Neurologic: CN 2-12 grossly intact. Sensation intact, DTR normal. Strength 5/5 in all 4.  Psychiatric: Normal judgment and insight. Alert and oriented x 3. Normal mood.   Labs on Admission: I have personally reviewed following labs and imaging studies  CBC: Recent Labs  Lab 07/09/19 0125  WBC 10.1  HGB 13.1  HCT 42.9  MCV 74.2*  PLT 384    Basic Metabolic Panel: Recent Labs  Lab 07/09/19 0125  NA 136  K 4.1  CL 103  CO2 20*  GLUCOSE 369*  BUN 13  CREATININE 0.76  CALCIUM 8.9    GFR: Estimated Creatinine Clearance: 105.8 mL/min (by C-G formula based on SCr of 0.76 mg/dL).  Liver  Function Tests: No results for input(s): AST, ALT, ALKPHOS, BILITOT, PROT, ALBUMIN in the last 168 hours.  Urine analysis: No results found for: COLORURINE, APPEARANCEUR, LABSPEC, PHURINE, GLUCOSEU, HGBUR, BILIRUBINUR, KETONESUR, PROTEINUR, UROBILINOGEN, NITRITE, LEUKOCYTESUR  Radiological Exams on Admission: DG Chest Portable 1 View  Result Date: 07/09/2019 CLINICAL DATA:  Chest pain EXAM: PORTABLE CHEST 1 VIEW COMPARISON:  Radiograph 01/03/2003 FINDINGS: Streaky opacities in the bases likely reflect atelectasis with low volumes and central vascular crowding. Some more coarse reticular opacities are more indeterminate but could reflect chronic interstitial change versus less likely acute interstitial inflammation. No pneumothorax or effusion. The cardiomediastinal contours are unremarkable. No acute osseous or soft tissue abnormality. Degenerative changes are present in the imaged spine and shoulders. Telemetry leads overlie the chest. IMPRESSION: 1. Streaky opacities in the bases likely reflect atelectasis with low volumes and central vascular crowding. 2. Coarse reticular opacities are indeterminate favor chronic interstitial change. Electronically Signed   By: Kreg Shropshire M.D.   On: 07/09/2019 01:45   CT Angio Chest/Abd/Pel for Dissection W and/or Wo Contrast  Result Date: 07/09/2019 CLINICAL DATA:  Chest pain radiating to left arm  with nausea. Aortic dissection suspected. EXAM: CT ANGIOGRAPHY CHEST, ABDOMEN AND PELVIS TECHNIQUE: Non-contrast CT of the chest was initially obtained. Multidetector CT imaging through the chest, abdomen and pelvis was performed using the standard protocol during bolus administration of intravenous contrast. Multiplanar reconstructed images and MIPs were obtained and reviewed to evaluate the vascular anatomy. CONTRAST:  181mL OMNIPAQUE IOHEXOL 350 MG/ML SOLN COMPARISON:  Radiograph earlier today. FINDINGS: CTA CHEST FINDINGS Cardiovascular: Thoracic aorta is normal in  caliber. No dissection, aortic hematoma, or evidence of acute aortic syndrome. No significant atherosclerosis. Conventional branching pattern from the aortic arch. Heart is normal in size without pericardial effusion. No filling defects in the central pulmonary arteries to the segmental level. Mediastinum/Nodes: Diffuse esophageal wall thickening extending from the carinal level to the gastroesophageal junction. Small mediastinal lymph nodes not enlarged by size criteria. No hilar adenopathy. No suspicious thyroid nodule. Lungs/Pleura: Clear lungs. No consolidation. No pleural fluid. No pulmonary edema. Trachea and mainstem bronchi are patent. Musculoskeletal: Mild degenerative change in the spine with Schmorl's nodes. There are no acute or suspicious osseous abnormalities. Review of the MIP images confirms the above findings. CTA ABDOMEN AND PELVIS FINDINGS VASCULAR Aorta: Normal caliber aorta without aneurysm, dissection, vasculitis or significant stenosis. Celiac: Patent without evidence of aneurysm, dissection, vasculitis or significant stenosis. SMA: Patent without evidence of aneurysm, dissection, vasculitis or significant stenosis. Renals: Both renal arteries are patent without evidence of aneurysm, dissection, vasculitis, fibromuscular dysplasia or significant stenosis. IMA: Patent without evidence of aneurysm, dissection, vasculitis or significant stenosis. Inflow: Predominantly noncalcified plaque involving the common iliac arteries, right greater than left. There is approximately 50% luminal narrowing of the right common iliac artery. No dissection or acute findings. Veins: No obvious venous abnormality within the limitations of this arterial phase study. Review of the MIP images confirms the above findings. NON-VASCULAR Hepatobiliary: No focal hepatic lesion on arterial phase exam. Possible noncalcified gallstones. No pericholecystic inflammation or biliary dilatation. Pancreas: No ductal dilatation or  inflammation. Spleen: Normal in size without focal abnormality. Adrenals/Urinary Tract: Normal adrenal glands. No hydronephrosis or perinephric edema. No visualized renal calculi. Distended urinary bladder without wall thickening. Stomach/Bowel: Ingested material within the stomach. No evidence of gastric wall thickening. No small bowel obstruction or inflammatory change. There is fecalization of small bowel contents. Normal appendix. Moderate volume of stool throughout the colon. The sigmoid colon is redundant. No colonic wall thickening. Lymphatic: No adenopathy. Reproductive: Prominent prostate gland spans 5 cm transverse. Other: Fat within both inguinal canals. No free air, free fluid, or intra-abdominal fluid collection. Musculoskeletal: There are no acute or suspicious osseous abnormalities. Review of the MIP images confirms the above findings. IMPRESSION: 1. No aortic dissection or acute aortic abnormality. 2. Diffuse esophageal wall thickening extending from the mid esophagus to the gastroesophageal junction. Findings may represent reflux or esophagitis. Length of involvement favors against neoplastic process. 3. Constipation bowel gas pattern with fecalization of small bowel contents, suggesting slow transit. 4. Distended urinary bladder without wall thickening. 5. Possible noncalcified gallstones. No gallbladder inflammation or biliary dilatation. Electronically Signed   By: Keith Rake M.D.   On: 07/09/2019 03:15    EKG: Independently reviewed.  Normal sinus rhythm, V1 and V2 with mild T wave inversion  Assessment/Plan Principal Problem:   ACS (acute coronary syndrome) (HCC) Active Problems:   Elevated troponin   GERD (gastroesophageal reflux disease)   Hyperlipidemia   Hyperglycemia   Acute coronary syndrome Elevated troponin level Shawn Landry presented to hospital with acute chest pain and  gradual elevation in troponin, concerning for ACS. Start treatment for ACS with aspirin,  metoprolol, atorvastatin, heparin drip, as well as nitroglycerin drip. Continue to trend troponin and EKG. Obtain 2D echocardiogram. Monitor on cardiac telemetry. Obtain cardiology consultation to assess need for cardiac catheterization and intervention.  Hyperlipidemia     Check lipid profile.  Continue atorvastatin Gastroesophageal reflux disease    Start pantoprazole  Hyperglycemia Shawn Landry does not have history of diabetes mellitus.  Continue to monitor blood glucose, check on her A1c. Might be related to acute stress response from chest pain   DVT prophylaxis: Heparin drip  Code Status:        Full code Family Communication:  Mother - Vershawn Westrup Disposition Plan:   Shawn Landry is from:  Home  Anticipated DC to:  Home  Anticipated DC date:  48-72 hrs  Anticipated DC barriers: None  Consults called:  Cardiology, Dr Okey Dupre Admission status:  Inpatient   Severity of Illness: The appropriate Shawn Landry status for this Shawn Landry is INPATIENT. Inpatient status is judged to be reasonable and necessary in order to provide the required intensity of service to ensure the Shawn Landry's safety. The Shawn Landry's presenting symptoms, physical exam findings, and initial radiographic and laboratory data in the context of their chronic comorbidities is felt to place them at high risk for further clinical deterioration. Furthermore, it is not anticipated that the Shawn Landry will be medically stable for discharge from the hospital within 2 midnights of admission. The following factors support the Shawn Landry status of inpatient.   " The Shawn Landry's presenting symptoms include: active chest pain. " The worrisome physical exam findings include. 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   1. Check the care team in [facility] and look for a) attending/consulting [facility] provider listed and b) the [facility] team listed 2. Log into [email or web address] and use [facility] universal password to access. If you do not have the password, please contact the hospital operator. 3. Locate the [facility] provider you are looking for under [facility] and page to a number that you can be directly reached. 4. If you still have difficulty reaching the provider, please page the [facility] (Director on Call) for the Hospitalists listed on amion for assistance.  07/09/2019, 5:05 AM

## 2019-07-09 NOTE — Progress Notes (Signed)
Subjective:  Complains of "inside pain" that has not changed at all since being in ER  Objective:  Vitals:   07/09/19 0621 07/09/19 0630 07/09/19 0645 07/09/19 0700  BP: 127/79 117/84 115/85 107/87  Pulse: 91 93 94 92  Resp: 16 15 17 17   Temp: 97.9 F (36.6 C)     TempSrc: Oral     SpO2: 92% 96% 97% 94%  Weight:      Height:        Physical Exam: Affect appropriate Healthy:  appears stated age HEENT: normal Neck supple with no adenopathy JVP normal no bruits no thyromegaly Lungs clear with no wheezing and good diaphragmatic motion Heart:  S1/S2 no murmur, no rub, gallop or click PMI normal Abdomen: benighn, BS positve, no tenderness, no AAA no bruit.  No HSM or HJR Distal pulses intact with no bruits No edema Neuro non-focal Skin warm and dry No muscular weakness   Lab Results: Basic Metabolic Panel: Recent Labs    07/09/19 0125  NA 136  K 4.1  CL 103  CO2 20*  GLUCOSE 369*  BUN 13  CREATININE 0.76  CALCIUM 8.9   CBC: Recent Labs    07/09/19 0125  WBC 10.1  HGB 13.1  HCT 42.9  MCV 74.2*  PLT 384    Recent Labs    07/09/19 0520  HGBA1C 11.3*   Fasting Lipid Panel: Recent Labs    07/09/19 0520  CHOL 265*  HDL 48  LDLCALC 202*  TRIG 74  CHOLHDL 5.5    Imaging: DG Chest Portable 1 View  Result Date: 07/09/2019 CLINICAL DATA:  Chest pain EXAM: PORTABLE CHEST 1 VIEW COMPARISON:  Radiograph 01/03/2003 FINDINGS: Streaky opacities in the bases likely reflect atelectasis with low volumes and central vascular crowding. Some more coarse reticular opacities are more indeterminate but could reflect chronic interstitial change versus less likely acute interstitial inflammation. No pneumothorax or effusion. The cardiomediastinal contours are unremarkable. No acute osseous or soft tissue abnormality. Degenerative changes are present in the imaged spine and shoulders. Telemetry leads overlie the chest. IMPRESSION: 1. Streaky opacities in the bases  likely reflect atelectasis with low volumes and central vascular crowding. 2. Coarse reticular opacities are indeterminate favor chronic interstitial change. Electronically Signed   By: Lovena Le M.D.   On: 07/09/2019 01:45   CT Angio Chest/Abd/Pel for Dissection W and/or Wo Contrast  Result Date: 07/09/2019 CLINICAL DATA:  Chest pain radiating to left arm with nausea. Aortic dissection suspected. EXAM: CT ANGIOGRAPHY CHEST, ABDOMEN AND PELVIS TECHNIQUE: Non-contrast CT of the chest was initially obtained. Multidetector CT imaging through the chest, abdomen and pelvis was performed using the standard protocol during bolus administration of intravenous contrast. Multiplanar reconstructed images and MIPs were obtained and reviewed to evaluate the vascular anatomy. CONTRAST:  156mL OMNIPAQUE IOHEXOL 350 MG/ML SOLN COMPARISON:  Radiograph earlier today. FINDINGS: CTA CHEST FINDINGS Cardiovascular: Thoracic aorta is normal in caliber. No dissection, aortic hematoma, or evidence of acute aortic syndrome. No significant atherosclerosis. Conventional branching pattern from the aortic arch. Heart is normal in size without pericardial effusion. No filling defects in the central pulmonary arteries to the segmental level. Mediastinum/Nodes: Diffuse esophageal wall thickening extending from the carinal level to the gastroesophageal junction. Small mediastinal lymph nodes not enlarged by size criteria. No hilar adenopathy. No suspicious thyroid nodule. Lungs/Pleura: Clear lungs. No consolidation. No pleural fluid. No pulmonary edema. Trachea and mainstem bronchi are patent. Musculoskeletal: Mild degenerative change in the spine with Schmorl's  nodes. There are no acute or suspicious osseous abnormalities. Review of the MIP images confirms the above findings. CTA ABDOMEN AND PELVIS FINDINGS VASCULAR Aorta: Normal caliber aorta without aneurysm, dissection, vasculitis or significant stenosis. Celiac: Patent without evidence  of aneurysm, dissection, vasculitis or significant stenosis. SMA: Patent without evidence of aneurysm, dissection, vasculitis or significant stenosis. Renals: Both renal arteries are patent without evidence of aneurysm, dissection, vasculitis, fibromuscular dysplasia or significant stenosis. IMA: Patent without evidence of aneurysm, dissection, vasculitis or significant stenosis. Inflow: Predominantly noncalcified plaque involving the common iliac arteries, right greater than left. There is approximately 50% luminal narrowing of the right common iliac artery. No dissection or acute findings. Veins: No obvious venous abnormality within the limitations of this arterial phase study. Review of the MIP images confirms the above findings. NON-VASCULAR Hepatobiliary: No focal hepatic lesion on arterial phase exam. Possible noncalcified gallstones. No pericholecystic inflammation or biliary dilatation. Pancreas: No ductal dilatation or inflammation. Spleen: Normal in size without focal abnormality. Adrenals/Urinary Tract: Normal adrenal glands. No hydronephrosis or perinephric edema. No visualized renal calculi. Distended urinary bladder without wall thickening. Stomach/Bowel: Ingested material within the stomach. No evidence of gastric wall thickening. No small bowel obstruction or inflammatory change. There is fecalization of small bowel contents. Normal appendix. Moderate volume of stool throughout the colon. The sigmoid colon is redundant. No colonic wall thickening. Lymphatic: No adenopathy. Reproductive: Prominent prostate gland spans 5 cm transverse. Other: Fat within both inguinal canals. No free air, free fluid, or intra-abdominal fluid collection. Musculoskeletal: There are no acute or suspicious osseous abnormalities. Review of the MIP images confirms the above findings. IMPRESSION: 1. No aortic dissection or acute aortic abnormality. 2. Diffuse esophageal wall thickening extending from the mid esophagus to the  gastroesophageal junction. Findings may represent reflux or esophagitis. Length of involvement favors against neoplastic process. 3. Constipation bowel gas pattern with fecalization of small bowel contents, suggesting slow transit. 4. Distended urinary bladder without wall thickening. 5. Possible noncalcified gallstones. No gallbladder inflammation or biliary dilatation. Electronically Signed   By: Narda Rutherford M.D.   On: 07/09/2019 03:15    Cardiac Studies:  ECG: NSR normal    Telemetry:  NSR no arrhythmia still tachycardia  Echo:   Medications:   . aspirin EC  81 mg Oral Daily  . atorvastatin  40 mg Oral Daily  . insulin aspart  0-9 Units Subcutaneous TID WC  . metoprolol tartrate  25 mg Oral BID     . 0.9 % NaCl with KCl 20 mEq / L 125 mL/hr at 07/09/19 0608  . heparin 900 Units/hr (07/09/19 0438)  . nitroGLYCERIN 20 mcg/min (07/09/19 0241)    Assessment/Plan:   1. Chest Pain:  Hours of pain with normal ECG and no real trend in Troponin CT with minimal coronary calcium Agree with fellow will proceed with non invasive evaluation Exercise myovue ( HR too high for CT) and echo today. Increase beta blocker CT with minimal coronary calcium I think we can stop nitro and heparin   2. DM:  New diagnosis would start on meds as inpatient.   3. HLD:  On statin   Charlton Haws 07/09/2019, 7:11 AM

## 2019-07-09 NOTE — Interval H&P Note (Signed)
Cath Lab Visit (complete for each Cath Lab visit)  Clinical Evaluation Leading to the Procedure:   ACS: Yes.    Non-ACS:    Anginal Classification: CCS III  Anti-ischemic medical therapy: Minimal Therapy (1 class of medications)  Non-Invasive Test Results: No non-invasive testing performed  Prior CABG: No previous CABG      History and Physical Interval Note:  07/09/2019 3:25 PM  Shawn Landry  has presented today for surgery, with the diagnosis of nstemi.  The various methods of treatment have been discussed with the patient and family. After consideration of risks, benefits and other options for treatment, the patient has consented to  Procedure(s): LEFT HEART CATH AND CORONARY ANGIOGRAPHY (N/A) as a surgical intervention.  The patient's history has been reviewed, patient examined, no change in status, stable for surgery.  I have reviewed the patient's chart and labs.  Questions were answered to the patient's satisfaction.     Lyn Records III

## 2019-07-09 NOTE — CV Procedure (Signed)
   Continuous chest discomfort starting at 9 PM last evening and complaining of 8-9 over 10 pain upon arrival in the Cath Lab.  Despite ongoing chest pain there were no acute ST-T wave changes and only minimal rise in troponin I.  Right radial access for coronary angiography and PCI of the circumflex marginal.  Real-time vascular ultrasound used for access.  Dominant right coronary with diffuse luminal irregularities proximal to distal.  Left main is widely patent  LAD with mid vessel calcified 50% narrowing between the first and second diagonals.  The first diagonal contains segmental 75% stenosis and is the larger of the 2.  Second diagonal contains 50% proximal/ostial narrowing.  Circumflex gives origin to one large obtuse marginal that supplies the lateral wall and inferolateral apical segment.  This obtuse marginal is totally occluded with contrast staining suggesting thrombus is present.  The remainder of the circumflex is small and supplies no significant myocardial territory.  Successful stenting of the obtuse marginal from 100% to 0% using a 15 x 2.75 mm Onyx drug-eluting stent deployed at 12 atm x 2.  TIMI grade III was established.  Diagonal and LAD disease should be treated medically.

## 2019-07-09 NOTE — ED Notes (Signed)
PAGED DR Tessa Lerner TO RN BILLY--Mithran Strike

## 2019-07-09 NOTE — Progress Notes (Signed)
ANTICOAGULATION CONSULT NOTE - Initial Consult  Pharmacy Consult for heparin Indication: chest pain/ACS  No Known Allergies  Patient Measurements: Height: 5\' 5"  (165.1 cm) Weight: 77.1 kg (170 lb) IBW/kg (Calculated) : 61.5 Heparin Dosing Weight: 76.9 kg   Vital Signs: Temp: 97.6 F (36.4 C) (05/25 0130) Temp Source: Oral (05/25 0120) BP: 119/92 (05/25 0345) Pulse Rate: 77 (05/25 0345)  Labs: Recent Labs    07/09/19 0125 07/09/19 0317  HGB 13.1  --   HCT 42.9  --   PLT 384  --   CREATININE 0.76  --   TROPONINIHS 5 23*    Estimated Creatinine Clearance: 105.8 mL/min (by C-G formula based on SCr of 0.76 mg/dL).   Medical History: Past Medical History:  Diagnosis Date  . GERD (gastroesophageal reflux disease)   . Hyperlipidemia     Assessment: 48 yom presenting with CP w/ SOB and diaphoretic - no AC PTA. CT neg for dissection.   Hgb 13.1, plt 384. Trop 23. No s/sx of bleeding.  Goal of Therapy:  Heparin level 0.3-0.7 units/ml Monitor platelets by anticoagulation protocol: Yes   Plan:  Give 4000 units bolus x 1 Start heparin infusion at 900 units/hr Check anti-Xa level in 6 hours and daily while on heparin Continue to monitor H&H and platelets  07/11/19, PharmD, BCCCP Clinical Pharmacist  07/09/2019 4:22 AM  Please check AMION for all [facility] phone numbers After 10:00 PM, call Main Pharmacy [phone]

## 2019-07-09 NOTE — ED Notes (Signed)
Pt transported to and from CT by this nurse. Continues to endorse 8/10 pressure in L chest/arm.

## 2019-07-09 NOTE — ED Notes (Signed)
Pt given second nitro dose.

## 2019-07-09 NOTE — ED Notes (Signed)
Pt endorses continued CP despite Nitro. Notified EDP

## 2019-07-09 NOTE — ED Notes (Signed)
Repeat EKG performed.   

## 2019-07-09 NOTE — ED Notes (Signed)
Pt now c/o nausea. 

## 2019-07-09 NOTE — Progress Notes (Signed)
Was called by ED RN.  When paged returned morning RN was taking care of the patient.  Reviewed the care and Dr. Eden Emms has already seen the patient and plan of care formulated. Will defer care to [facility].   Tessa Lerner, [place], [facility]  Pager: [phone] Office: [phone]

## 2019-07-09 NOTE — H&P (View-Only) (Signed)
Patient with ongoing pain  ECG normal  Troponin increased to 420 Will arrange heart catheterization today next case Continue heparin and nitro  Peter Nishan MD FACC 

## 2019-07-09 NOTE — ED Provider Notes (Signed)
[facility] EMERGENCY DEPARTMENT Provider Note   CSN: [id number] Arrival date & time: 07/09/19  0056     History Chief Complaint  Patient presents with  . Chest Pain    Shawn Landry is a 49 y.o. male.  Patient presents to the emergency department with a chief complaint of chest pain.  He states that the chest pain started about 6 PM and gradually worsened until 9 PM.  He tried taking 1 baby aspirin.  Cardiac risk factors include former smoking history, hyperlipidemia, and early family history of heart disease.  He reports that his brother died of heart problems at the age of 7.  He reports associated shortness of breath and radiation. .  The history is provided by the patient. No language interpreter was used.       Past Medical History:  Diagnosis Date  . GERD (gastroesophageal reflux disease)   . Hyperlipidemia     There are no problems to display for this patient.   History reviewed. No pertinent surgical history.     No family history on file.  Social History   Tobacco Use  . Smoking status: Current Every Day Smoker  Substance Use Topics  . Alcohol use: Not on file  . Drug use: Not on file    Home Medications Prior to Admission medications   Medication Sig Start Date End Date Taking? Authorizing Provider  rosuvastatin (CRESTOR) 10 MG tablet Take 10 mg by mouth daily.    [provider]    Allergies    Patient has no allergy information on record.  Review of Systems   Review of Systems  All other systems reviewed and are negative.   Physical Exam Updated Vital Signs BP (!) 150/88 (BP Location: Right Arm)   Pulse 81   Temp 97.8 F (36.6 C) (Oral)   Resp (!) 24   Ht 5\' 5"  (1.651 m)   Wt 77.1 kg   SpO2 100%   BMI 28.29 kg/m   Physical Exam Vitals and nursing note reviewed.  Constitutional:      General: He is in acute distress.     Appearance: He is well-developed. He is diaphoretic.  HENT:     Head:  Normocephalic and atraumatic.  Eyes:     Conjunctiva/sclera: Conjunctivae normal.  Cardiovascular:     Rate and Rhythm: Normal rate and regular rhythm.     Heart sounds: No murmur.  Pulmonary:     Effort: Pulmonary effort is normal. No respiratory distress.     Breath sounds: Normal breath sounds.  Abdominal:     Palpations: Abdomen is soft.     Tenderness: There is no abdominal tenderness.  Musculoskeletal:     Cervical back: Neck supple.  Skin:    General: Skin is warm.  Neurological:     Mental Status: He is alert and oriented to person, place, and time.  Psychiatric:        Mood and Affect: Mood normal.        Behavior: Behavior normal.     ED Results / Procedures / Treatments   Labs (all labs ordered are listed, but only abnormal results are displayed) Labs Reviewed  BASIC METABOLIC PANEL  CBC  TROPONIN I (HIGH SENSITIVITY)    EKG EKG Interpretation  Date/Time:  Tuesday Jul 09 2019 01:09:38 EDT Ventricular Rate:  83 PR Interval:  130 QRS Duration: 88 QT Interval:  344 QTC Calculation: 404 R Axis:   88 Text Interpretation: Normal  sinus rhythm Normal ECG Confirmed by Ross Marcus ([id number]) on 07/09/2019 1:23:45 AM   Radiology No results found.  Procedures .Critical Care Performed by: Roxy Horseman, PA-C Authorized by: Roxy Horseman, PA-C   Critical care provider statement:    Critical care time (minutes):  45   Critical care was time spent personally by me on the following activities:  Discussions with consultants, evaluation of patient's response to treatment, examination of patient, ordering and performing treatments and interventions, ordering and review of laboratory studies, ordering and review of radiographic studies, pulse oximetry, re-evaluation of patient's condition, obtaining history from patient or surrogate and review of old charts   (including critical care time)  Medications Ordered in ED Medications  sodium chloride flush (NS)  0.9 % injection 3 mL (has no administration in time range)    ED Course  I have reviewed the triage vital signs and the nursing notes.  Pertinent labs & imaging results that were available during my care of the patient were reviewed by me and considered in my medical decision making (see chart for details).   Patient with gradually worsening chest pain since 6 pm.  He is diaphoretic, and has SOB, central CP, and left arm pain.  He doesn't have any ripping or tearing sensation.  He doesn't have any back pain.  He does look distressed and uncomfortable.  Troponin is negative.  EKG still normal.  Will go for CT to r/o dissection.  3:26 AM Dissection study shows no evidence of dissection, but does show evidence of esophagitis.  Patient denies any heavy alcohol use, he states that he rarely has a glass of wine.  He denies any heavy ibuprofen use, but does report that he was recently prescribed meloxicam for a muscle spasm.  He has been taking this for the past week or so.  Patient continues to complain of central chest pain and left arm pain.  Case discussed with Dr. Wilkie Aye, will treat with Protonix and give GI cocktail.  Waiting for repeat troponin to result.  If negative and patient has resolution of symptoms with GI cocktail, could consider discharge with close outpatient follow-up.  If he does not have improvement of his symptoms, will admit for observation.    MDM Rules/Calculators/A&P                      This patient complains of chest pain, this involves an extensive number of treatment options, and is a complaint that carries with it a high risk of complications and morbidity.  The differential diagnosis includes ACS, dissection, ptx, infection.  Pertinent Labs I ordered, reviewed, and interpreted labs, which included CBC which is reassuring, BMP notable for glucose of 369, he denies DM, troponin is 5.  Repeat troponin is 23, trending up.    Imaging Interpretation I ordered  imaging studies which included chest x-ray and CT chest/abdomen/pelvis dissection study.  I independently visualized and interpreted these studies, which showed no evidence of dissection, streaky opacities on chest x-ray.   Medications I ordered medication sublingual nitroglycerin, aspirin, morphine for pain.  I ultimately started the patient on a nitro drip and heparin infusion.   Consultants Dr. Olevia Bowens - [facility] - Consulted for admission.  Recommends formal cardiology consultation now. Dr. Okey Dupre - Cardiology - States that he will come to see the patient.  Appreciate Dr. Olevia Bowens and Dr. Okey Dupre for their assistance.  Critical Interventions  Heparin infusion for elevated troponin.  Reassessments After the interventions stated above, I  reevaluated the patient and found still having persistent pain.  Will admit to hospital.  Final Clinical Impression(s) / ED Diagnoses Final diagnoses:  ACS (acute coronary syndrome) [facility])    Rx / DC Orders ED Discharge Orders    None       Montine Circle, PA-C 07/09/19 0515    Merryl Hacker, MD 07/09/19 [phone]

## 2019-07-09 NOTE — Consult Note (Signed)
Cardiology Consult    Patient ID: ARSHIA RONDON MRN: [id number], DOB/AGE: 03-02-1970   Admit date: 07/09/2019 Date of Consult: 07/09/2019  Primary Physician: Patient, No Pcp Per Primary Cardiologist: No primary care provider on file. Requesting Provider: Youlanda Roys  Patient Profile    WARD BOISSONNEAULT is a 49 y.o. male with a history of hyperlipidemia and GERD. He is being seen today for the evaluation of chest pain.  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 This is not pleuritic and not positional. The pain has been constant since then - he rates it 8.75 at the lowest point. He is very anxious. There are no exacerbating factors, including exertion. The pain is more on his left side and radiates into his shoulder, occasionally involving his left arm as well intermittently. He feels short of breath but this may be due to the pain. No significant nausea. He was reported to be diaphoretic in triage as well. He has never experienced anything like this before. Denies any illicit drug use or any stimulants. He has been taking Meloxicam for the last couple of weeks due to neck pain. Reports a history of SCD in his brother at age 51 - he says autopsy report came back showing that his "arteries were blocked".   In the ED, he is borderline tachycardic. Serial ECGs show sinus rhythm and are normal. He had a CTA to rule out dissection that was only significant for esophageal thickening most consistent with esophagitis. I will also note that there was only minimal coronary calcium, only involving the mid-LAD which fills on the contrasted images. His first troponin was 5 and 23 on repeat. He has been started on nitro infusion with no effect, and has also received multiple IV opiate pushes with  minimal effect on his pain.   Past Medical History   Past Medical History:  Diagnosis Date  . GERD (gastroesophageal reflux disease)   . Hyperlipidemia     History reviewed. No pertinent surgical history.   No Known Allergies Inpatient Medications      Family History    No family history on file. has no family status information on file.    Social History    Social History   Socioeconomic History  . Marital status: Single    Spouse name: Not on file  . Number of children: Not on file  . Years of education: Not on file  . Highest education level: Not on file  Occupational History  . Not on file  Tobacco Use  . Smoking status: Current Every Day Smoker  Substance and Sexual Activity  . Alcohol use: Not on file  . Drug use: Not on file  . Sexual activity: Not on file  Other Topics Concern  . Not on file  Social History Narrative  . Not on file   Social Determinants of Health   Financial Resource Strain:   . Difficulty of Paying Living Expenses:   Food Insecurity:   . Worried About Programme researcher, broadcasting/film/video in the Last Year:   . Barista in the Last Year:   Transportation Needs:   . Freight forwarder (Medical):   Marland Kitchen Lack of Transportation (Non-Medical):   Physical Activity:   . Days of Exercise  per Week:   . Minutes of Exercise per Session:   Stress:   . Feeling of Stress :   Social Connections:   . Frequency of Communication with Friends and Family:   . Frequency of Social Gatherings with Friends and Family:   . Attends Religious Services:   . Active Member of Clubs or Organizations:   . Attends Banker Meetings:   Marland Kitchen Marital Status:   Intimate Partner Violence:   . Fear of Current or Ex-Partner:   . Emotionally Abused:   Marland Kitchen Physically Abused:   . Sexually Abused:      Review of Systems    General:  No chills, fever, night sweats or weight changes.  Cardiovascular:  No chest pain, dyspnea on exertion, edema, orthopnea,  palpitations, paroxysmal nocturnal dyspnea. Dermatological: No rash, lesions/masses Respiratory: No cough, dyspnea Urologic: No hematuria, dysuria Abdominal:   No nausea, vomiting, diarrhea, bright red blood per rectum, melena, or hematemesis Neurologic:  No visual changes, wkns, changes in mental status. All other systems reviewed and are otherwise negative except as noted above.  Physical Exam    Blood pressure (!) 119/92, pulse 77, temperature 97.6 F (36.4 C), resp. rate (!) 22, height 5\' 5"  (1.651 m), weight 77.1 kg, SpO2 99 %.    No intake or output data in the 24 hours ending 07/09/19 0442 Wt Readings from Last 3 Encounters:  07/09/19 77.1 kg   CONSTITUTIONAL: alert and conversant, uncomfortable and anxious appearing HEENT: oropharynx clear and moist, no mucosal lesions, poor dentition, conjunctiva normal, EOM intact, pupils equal, no lid lag. NECK: supple, no cervical adenopathy, no thyromegaly CARDIOVASCULAR: Regular rhythm. No gallop, murmur, or rub. Normal S1/S2. Radial pulses intact. JVP is normal. No carotid bruits. PULMONARY/CHEST WALL: no deformities, normal breath sounds bilaterally, normal work of breathing ABDOMINAL: soft, non-tender, non-distended EXTREMITIES: no edema or muscle atrophy, warm and well-perfused SKIN: Dry and intact without apparent rashes or wounds. NEUROLOGIC: alert, normal gait, no abnormal movements, cranial nerves grossly intact.   Labs    Lab Results  Component Value Date   NA 136 07/09/2019   K 4.1 07/09/2019   CL 103 07/09/2019   CREATININE 0.76 07/09/2019   GFRNONAA >60 07/09/2019   GFRAA >60 07/09/2019   BUN 13 07/09/2019   CO2 20 (L) 07/09/2019   CALCIUM 8.9 07/09/2019   HGBA1C 11.3 (H) 07/09/2019   HGB 13.1 07/09/2019   PLT 384 07/09/2019   Recent Labs    07/09/19 0125 07/09/19 0317  TROPONINIHS 5 23*   Radiology Studies    CT Angio Chest/Abd/Pel for Dissection W and/or Wo Contrast Result Date: 07/09/2019 CLINICAL  DATA:  Chest pain radiating to left arm with nausea. Aortic dissection suspected. EXAM: CT ANGIOGRAPHY CHEST, ABDOMEN AND PELVIS TECHNIQUE: Non-contrast CT of the chest was initially obtained. Multidetector CT imaging through the chest, abdomen and pelvis was performed using the standard protocol during bolus administration of intravenous contrast. Multiplanar reconstructed images and MIPs were obtained and reviewed to evaluate the vascular anatomy. CONTRAST:  07/11/2019 OMNIPAQUE IOHEXOL 350 MG/ML SOLN COMPARISON:  Radiograph earlier today. FINDINGS: CTA CHEST FINDINGS Cardiovascular: Thoracic aorta is normal in caliber. No dissection, aortic hematoma, or evidence of acute aortic syndrome. No significant atherosclerosis. Conventional branching pattern from the aortic arch. Heart is normal in size without pericardial effusion. No filling defects in the central pulmonary arteries to the segmental level. Mediastinum/Nodes: Diffuse esophageal wall thickening extending from the carinal level to the gastroesophageal junction. Small mediastinal lymph nodes  not enlarged by size criteria. No hilar adenopathy. No suspicious thyroid nodule. Lungs/Pleura: Clear lungs. No consolidation. No pleural fluid. No pulmonary edema. Trachea and mainstem bronchi are patent. Musculoskeletal: Mild degenerative change in the spine with Schmorl's nodes. There are no acute or suspicious osseous abnormalities. Review of the MIP images confirms the above findings. CTA ABDOMEN AND PELVIS FINDINGS VASCULAR Aorta: Normal caliber aorta without aneurysm, dissection, vasculitis or significant stenosis. Celiac: Patent without evidence of aneurysm, dissection, vasculitis or significant stenosis. SMA: Patent without evidence of aneurysm, dissection, vasculitis or significant stenosis. Renals: Both renal arteries are patent without evidence of aneurysm, dissection, vasculitis, fibromuscular dysplasia or significant stenosis. IMA: Patent without evidence of  aneurysm, dissection, vasculitis or significant stenosis. Inflow: Predominantly noncalcified plaque involving the common iliac arteries, right greater than left. There is approximately 50% luminal narrowing of the right common iliac artery. No dissection or acute findings. Veins: No obvious venous abnormality within the limitations of this arterial phase study. Review of the MIP images confirms the above findings. NON-VASCULAR Hepatobiliary: No focal hepatic lesion on arterial phase exam. Possible noncalcified gallstones. No pericholecystic inflammation or biliary dilatation. Pancreas: No ductal dilatation or inflammation. Spleen: Normal in size without focal abnormality. Adrenals/Urinary Tract: Normal adrenal glands. No hydronephrosis or perinephric edema. No visualized renal calculi. Distended urinary bladder without wall thickening. Stomach/Bowel: Ingested material within the stomach. No evidence of gastric wall thickening. No small bowel obstruction or inflammatory change. There is fecalization of small bowel contents. Normal appendix. Moderate volume of stool throughout the colon. The sigmoid colon is redundant. No colonic wall thickening. Lymphatic: No adenopathy. Reproductive: Prominent prostate gland spans 5 cm transverse. Other: Fat within both inguinal canals. No free air, free fluid, or intra-abdominal fluid collection. Musculoskeletal: There are no acute or suspicious osseous abnormalities. Review of the MIP images confirms the above findings. IMPRESSION: 1. No aortic dissection or acute aortic abnormality. 2. Diffuse esophageal wall thickening extending from the mid esophagus to the gastroesophageal junction. Findings may represent reflux or esophagitis. Length of involvement favors against neoplastic process. 3. Constipation bowel gas pattern with fecalization of small bowel contents, suggesting slow transit. 4. Distended urinary bladder without wall thickening. 5. Possible noncalcified gallstones. No  gallbladder inflammation or biliary dilatation. Electronically Signed   By: Keith Rake M.D.   On: 07/09/2019 03:15    ECG & Cardiac Imaging    ECG: sinus rhythm, normal ECG - personally reviewed.  Assessment & Plan   Chest pain: he has reported ongoing severe chest pain now for over 8 hours, and in spite of this his initial troponin was 5 and only borderline on repeat at 23. He also has no ischemic ECG changes and on my assessment, wall motion appears to be normal. On review of his CT, he has only mild calcification of the LAD which appears to be patent out to the apex on the contrasted images. The RCA opacifies well all the way to the PDA. Circumflex can be visualized proximally, but not well visualized on this non-gated study. His main risk factors appears to be undiagnosed diabetes (prior to today) and a questionable family history of premature CAD. It would be very unusual to have chest pain of this duration and severity without any significant objective evidence of injury or ischemia - his troponin trend is discongruent with his symptoms. I would favor an initial non-invasive cardiac evaluation assuming his troponins do not rise significantly.  - Discussed with ED providers - we will try to get heart rate  down with anxiolytics and beta blocker for CCTA to define coronary anatomy non-invasively. He has minimal coronary calcium.  - Agree with TTE as well - Risk stratification labs show A1c of 11%, lipids and repeat troponin are pending - If troponins rise significantly he would warrant urgent left heart catheterization and invasive angiography - Monitor on telemetry for now - Further recommendations pending evaluation outlined above.  Signed, Clerance Lav, MD 07/09/2019, 4:42 AM  For questions or updates, please contact   Please consult [email or web address] for contact info under Cardiology/STEMI.

## 2019-07-09 NOTE — ED Notes (Signed)
This nurse notified of critical troponin by lab. MD paged

## 2019-07-09 NOTE — Progress Notes (Signed)
Inpatient Diabetes Program Recommendations  AACE/ADA: New Consensus Statement on Inpatient Glycemic Control (2015)  Target Ranges:  Prepandial:   less than 140 mg/dL      Peak postprandial:   less than 180 mg/dL (1-2 hours)      Critically ill patients:  140 - 180 mg/dL   Lab Results  Component Value Date   GLUCAP 295 (H) 07/09/2019   HGBA1C 11.3 (H) 07/09/2019    Review of Glycemic Control Results for Shawn Landry, Shawn Landry (MRN [id number]) as of 07/09/2019 11:22  Ref. Range 07/09/2019 08:06  Glucose-Capillary Latest Ref Range: 70 - 99 mg/dL 037 (H)   Diabetes history: DM 2-NEW DIAGNOSIS Outpatient Diabetes medications:  None Current orders for Inpatient glycemic control:  Novolog sensitive tid with meals  Inpatient Diabetes Program Recommendations:    Note new diagnosis of DM.  A1C indicates average blood sugars of 277 mg/dL prior to admit.  May need insulin at d/c based on high A1C?  Will see patient to discuss further. Will also need PCP for f/u regarding new diagnosis of DM.   Thanks,  Beryl Meager, RN, BC-ADM Inpatient Diabetes Coordinator Pager [phone] (8a-5p)

## 2019-07-09 NOTE — Progress Notes (Signed)
   Pt seen in nuclear medicine for planned Lexiscan stress test with ongoing chest pain. hsT found to have elevated from 155 to 420. Pt currently stable on IV NTG and Heparin infusions. Dr. Eden Emms contacted with plan for [facility] for full cardiac assessment.   Cardiac catheterization was discussed with the patient fully. The patient understands that risks include but are not limited to stroke (1 in 1000), death (1 in 1000), kidney failure [usually temporary] (1 in 500), bleeding (1 in 200), allergic reaction [possibly serious] (1 in 200).  The patient understands and is willing to proceed.    Georgie Chard NP-C [facility] Pager: [phone]

## 2019-07-09 NOTE — ED Notes (Signed)
Nitro drip started at 5 mcg/min

## 2019-07-09 NOTE — Progress Notes (Signed)
On my arrival to Stress Lab to do Lexiscan stress test, patient on IV Nitro, ED RN present with patient. Patient states having 8.5 out of 10 mid to left chest pain radiating to left shoulder. Also SOB.This has been ongoing since last evening. Spo2 99-98% on room air. Troponin trending upward. Arelia Longest PA paged and arrived within a minute or two of page. Reported chest pain/SOB  and upward trending Troponins. She spoke with Dr. Eden Emms who stated he was unaware of latest enzyme reportat 6;55 am. He is to have a Cardiac Cath instead of stress test. Arelia Longest PA explained this to patient and told patient if he thinks of any questions, she can be paged.  ED RN aware of pending heart cath. Patient and ED RN aware nothing by mouth. She will arrange the Heart Cath. Patient placed back on ED 12 lead monitor and patient transported via stretcher back to the ED with the ED RN and Kennon Portela RN.

## 2019-07-09 NOTE — ED Notes (Signed)
Pt extremely diaphoretic, tachypneic. C/O 10/10 pain radiating down L arm described as "an elephant sitting on my chest".

## 2019-07-10 ENCOUNTER — Telehealth: Payer: Self-pay | Admitting: Interventional Cardiology

## 2019-07-10 ENCOUNTER — Inpatient Hospital Stay (HOSPITAL_COMMUNITY): Payer: BC Managed Care – PPO

## 2019-07-10 DIAGNOSIS — I214 Non-ST elevation (NSTEMI) myocardial infarction: Secondary | ICD-10-CM | POA: Diagnosis not present

## 2019-07-10 DIAGNOSIS — R0602 Shortness of breath: Secondary | ICD-10-CM | POA: Diagnosis not present

## 2019-07-10 DIAGNOSIS — K219 Gastro-esophageal reflux disease without esophagitis: Secondary | ICD-10-CM | POA: Diagnosis not present

## 2019-07-10 DIAGNOSIS — Z20822 Contact with and (suspected) exposure to covid-19: Secondary | ICD-10-CM | POA: Diagnosis not present

## 2019-07-10 DIAGNOSIS — E785 Hyperlipidemia, unspecified: Secondary | ICD-10-CM | POA: Diagnosis not present

## 2019-07-10 DIAGNOSIS — I249 Acute ischemic heart disease, unspecified: Secondary | ICD-10-CM

## 2019-07-10 DIAGNOSIS — R739 Hyperglycemia, unspecified: Secondary | ICD-10-CM

## 2019-07-10 DIAGNOSIS — R778 Other specified abnormalities of plasma proteins: Secondary | ICD-10-CM

## 2019-07-10 DIAGNOSIS — R079 Chest pain, unspecified: Secondary | ICD-10-CM

## 2019-07-10 DIAGNOSIS — E1165 Type 2 diabetes mellitus with hyperglycemia: Secondary | ICD-10-CM | POA: Diagnosis not present

## 2019-07-10 DIAGNOSIS — I25110 Atherosclerotic heart disease of native coronary artery with unstable angina pectoris: Secondary | ICD-10-CM | POA: Diagnosis not present

## 2019-07-10 LAB — BASIC METABOLIC PANEL
Anion gap: 12 (ref 5–15)
BUN: 10 mg/dL (ref 6–20)
CO2: 21 mmol/L — ABNORMAL LOW (ref 22–32)
Calcium: 8.3 mg/dL — ABNORMAL LOW (ref 8.9–10.3)
Chloride: 103 mmol/L (ref 98–111)
Creatinine, Ser: 0.65 mg/dL (ref 0.61–1.24)
GFR calc Af Amer: >60 mL/min (ref >60)
GFR calc non Af Amer: >60 mL/min (ref >60)
Glucose, Bld: 253 mg/dL — ABNORMAL HIGH (ref 70–99)
Potassium: 4.1 mmol/L (ref 3.5–5.1)
Sodium: 136 mmol/L (ref 135–145)

## 2019-07-10 LAB — CBC
HCT: 36.7 % — ABNORMAL LOW (ref 39.0–52.0)
Hemoglobin: 11.2 g/dL — ABNORMAL LOW (ref 13.0–17.0)
MCH: 22.9 pg — ABNORMAL LOW (ref 26.0–34.0)
MCHC: 30.5 g/dL (ref 30.0–36.0)
MCV: 74.9 fL — ABNORMAL LOW (ref 80.0–100.0)
Platelets: 320 10*3/uL (ref 150–400)
RBC: 4.9 MIL/uL (ref 4.22–5.81)
RDW: 17.7 % — ABNORMAL HIGH (ref 11.5–15.5)
WBC: 12.5 10*3/uL — ABNORMAL HIGH (ref 4.0–10.5)
nRBC: 0 % (ref 0.0–0.2)

## 2019-07-10 LAB — GLUCOSE, CAPILLARY
Glucose-Capillary: 205 mg/dL — ABNORMAL HIGH (ref 70–99)
Glucose-Capillary: 194 mg/dL — ABNORMAL HIGH (ref 70–99)
Glucose-Capillary: 211 mg/dL — ABNORMAL HIGH (ref 70–99)
Glucose-Capillary: 200 mg/dL — ABNORMAL HIGH (ref 70–99)

## 2019-07-10 LAB — POCT ACTIVATED CLOTTING TIME
Activated Clotting Time: 279 seconds
Activated Clotting Time: 345 seconds

## 2019-07-10 LAB — ECHOCARDIOGRAM COMPLETE
Height: 65 in
Weight: 2720 oz

## 2019-07-10 MED ORDER — INSULIN STARTER KIT- PEN NEEDLES (ENGLISH)
1.0000 | Freq: Once | Status: AC
  Administered 2019-07-10: 1
  Filled 2019-07-10: qty 1

## 2019-07-10 MED ORDER — LIVING WELL WITH DIABETES BOOK
Freq: Once | Status: AC
  Filled 2019-07-10: qty 1

## 2019-07-10 MED ORDER — SALINE SPRAY 0.65 % NA SOLN
1.0000 | NASAL | Status: DC | PRN
  Administered 2019-07-10: 1 via NASAL
  Filled 2019-07-10: qty 44

## 2019-07-10 MED ORDER — ATORVASTATIN CALCIUM 80 MG PO TABS: 80.0000 mg | ORAL_TABLET | Freq: Every day | ORAL | Status: DC

## 2019-07-10 MED ORDER — METOPROLOL TARTRATE 50 MG PO TABS
75.0000 mg | ORAL_TABLET | Freq: Two times a day (BID) | ORAL | Status: DC
  Administered 2019-07-10 – 2019-07-11 (×3): 75 mg via ORAL
  Filled 2019-07-10 (×3): qty 1

## 2019-07-10 MED FILL — Tirofiban HCl in NaCl 0.9% IV Soln 5 MG/100ML (Base Equiv): INTRAVENOUS | Qty: 100 | Status: AC

## 2019-07-10 NOTE — Progress Notes (Addendum)
Brief Nutrition Note  RD consulted for diet education regarding newly diagnosed T2DM and HLD. RD working remotely.  Spoke with pt via phone call to room. Pt is requesting that RD provide in-person education tomorrow prior to discharge. Pt reports that he has received a lot of new information today and is still trying to process everything. RD is in agreement with request to provide in-person diet education given pt's desire to learn, multiple questions, and request for detailed information/a plan that he can follow going forward.  RD will plan to meet with pt tomorrow morning at 0900 to provide Heart Healthy/Consistent Carbohydrate diet education with handouts. Pt is pleased with this plan and feels it will work much better for him than education via phone call. Noted possibility that pt will be discharged tomorrow, so RD will prioritize education first thing in the morning.   Earma Reading, MS, RD, LDN Inpatient Clinical Dietitian Pager: [phone] Weekend/After Hours: [phone]

## 2019-07-10 NOTE — Progress Notes (Signed)
  Echocardiogram 2D Echocardiogram has been performed.  Can [name] 07/10/2019, 9:45 AM

## 2019-07-10 NOTE — Progress Notes (Signed)
PROGRESS NOTE    Shawn Landry  WNU:[id number] DOB: 18-Jun-1970 DOA: 07/09/2019 PCP: Patient, No Pcp Per   Brief Narrative:  HPI On 07/09/2019 by Dr. Allie Dimmer Shawn Landry is a 49 y.o. male with past medical history of hyperlipidemia, gastroesophageal reflux disease who came to hospital tonight for evaluation of chest pain. Patient stated that he was in usual state of health until last evening, when around of 9 PM, while he was watching TV, he quickly developed left-sided chest pressure, about 10/10, without radiation, described like elephant sitting on the chest. The pain was constant, level was fluctuating, but never went away.  Patient reported mild shortness of breath, intermittent nausea and increased anxiety.  Patient does not have any previous cardiac history.  He is a former smoker, quit about 10 years ago.  Denies use of any illicit drugs, specifically cocaine or alcohol.  Report history of sudden death, at his brother at age of 46, presumably due to cardiac event.  Due to ongoing chest pain, patient decided come to ED for evaluation. Assessment & Plan   Chest pain/SEMI -with elevated troponin and ongoing chest pain on admission -Cardiology consulted and appreciated -Status post catheterization, stenting to marginal -Continue metoprolol, aspirin, Brilinta, statin -Pending echocardiogram  New onset Diabetes mellitus, type II with hyperglycemia -Hemoglobin A1c 11.3, no prior for comparison -Pain scale CBG monitoring -Diabetes coordinator consulted and appreciated -Given patient's motivation, feel that oral regimen may be warranted -Nutrition also consulted with  Hyperlipidemia -Lipid panel shows total cholesterol 265, HDL 48, LDL 202, triglycerides 74 -Continue statin  GERD -Continue PPI  DVT Prophylaxis  heparin  Code Status: Full  Family Communication: Brother at bedside  Disposition Plan:  Status is: Inpatient  Remains inpatient appropriate because:Ongoing  diagnostic testing needed not appropriate for outpatient work up   Dispo: The patient is from: Home              Anticipated d/c is to: Home              Anticipated d/c date is: 1 day              Patient currently is not medically stable to d/c.   Consultants Cardiology  Procedures  Left heart catheterization coronary angiography  Antibiotics   Anti-infectives (From admission, onward)   None      Subjective:   [name] seen and examined today.  Patient feeling better this morning.  He denies current chest pain or shortness of breath, abdominal pain, nausea or vomiting, dizziness or headache.  Objective:   Vitals:   07/10/19 0001 07/10/19 0357 07/10/19 0756 07/10/19 1115  BP: 100/81 (!) 126/93 118/83 (!) 108/92  Pulse: 97 98 (!) 111 96  Resp: 14 18 18 18   Temp: 98.7 F (37.1 C) 97.8 F (36.6 C) (!) 97.5 F (36.4 C) 97.9 F (36.6 C)  TempSrc: Oral Oral Oral Oral  SpO2: 98% 97% 99%   Weight:      Height:        Intake/Output Summary (Last 24 hours) at 07/10/2019 1159 Last data filed at 07/10/2019 0347 Gross per 24 hour  Intake 6 ml  Output --  Net 6 ml   Filed Weights   07/09/19 0122  Weight: 77.1 kg    Exam  General: Well developed, well nourished, NAD, appears stated age  HEENT: NCAT, mucous membranes moist.   Cardiovascular: S1 S2 auscultated, no murmur, tachycardic  Respiratory: Clear to auscultation bilaterally  Abdomen: Soft,  nontender, nondistended, + bowel sounds  Extremities: warm dry without cyanosis clubbing or edema  Neuro: AAOx3, nonfocal  Psych: Normal affect and demeanor, pleasant   Data Reviewed: I have personally reviewed following labs and imaging studies  CBC: Recent Labs  Lab 07/09/19 0125 07/10/19 0212  WBC 10.1 12.5*  HGB 13.1 11.2*  HCT 42.9 36.7*  MCV 74.2* 74.9*  PLT 384 505   Basic Metabolic Panel: Recent Labs  Lab 07/09/19 0125 07/10/19 0212  NA 136 136  K 4.1 4.1  CL 103 103  CO2 20* 21*   GLUCOSE 369* 253*  BUN 13 10  CREATININE 0.76 0.65  CALCIUM 8.9 8.3*   GFR: Estimated Creatinine Clearance: 105.8 mL/min (by C-G formula based on SCr of 0.65 mg/dL). Liver Function Tests: No results for input(s): AST, ALT, ALKPHOS, BILITOT, PROT, ALBUMIN in the last 168 hours. No results for input(s): LIPASE, AMYLASE in the last 168 hours. No results for input(s): AMMONIA in the last 168 hours. Coagulation Profile: No results for input(s): INR, PROTIME in the last 168 hours. Cardiac Enzymes: No results for input(s): CKTOTAL, CKMB, CKMBINDEX, TROPONINI in the last 168 hours. BNP (last 3 results) No results for input(s): PROBNP in the last 8760 hours. HbA1C: Recent Labs    07/09/19 0520  HGBA1C 11.3*   CBG: Recent Labs  Lab 07/09/19 1342 07/09/19 1909 07/09/19 2129 07/10/19 0613 07/10/19 1112  GLUCAP 196* 330* 258* 205* 194*   Lipid Profile: Recent Labs    07/09/19 0520  CHOL 265*  HDL 48  LDLCALC 202*  TRIG 74  CHOLHDL 5.5   Thyroid Function Tests: No results for input(s): TSH, T4TOTAL, FREET4, T3FREE, THYROIDAB in the last 72 hours. Anemia Panel: No results for input(s): VITAMINB12, FOLATE, FERRITIN, TIBC, IRON, RETICCTPCT in the last 72 hours. Urine analysis: No results found for: COLORURINE, APPEARANCEUR, LABSPEC, PHURINE, GLUCOSEU, HGBUR, BILIRUBINUR, KETONESUR, PROTEINUR, UROBILINOGEN, NITRITE, LEUKOCYTESUR Sepsis Labs: @LABRCNTIP (procalcitonin:4,lacticidven:4)  ) Recent Results (from the past 240 hour(s))  SARS Coronavirus 2 by RT PCR (hospital order, performed in [facility] hospital lab) Nasopharyngeal Nasopharyngeal Swab     Status: None   Collection Time: 07/09/19  4:43 AM   Specimen: Nasopharyngeal Swab  Result Value Ref Range Status   SARS Coronavirus 2 NEGATIVE NEGATIVE Final    Comment: (NOTE) SARS-CoV-2 target nucleic acids are NOT DETECTED. The SARS-CoV-2 RNA is generally detectable in upper and lower respiratory specimens during the  acute phase of infection. The lowest concentration of SARS-CoV-2 viral copies this assay can detect is 250 copies / mL. A negative result does not preclude SARS-CoV-2 infection and should not be used as the sole basis for treatment or other patient management decisions.  A negative result may occur with improper specimen collection / handling, submission of specimen other than nasopharyngeal swab, presence of viral mutation(s) within the areas targeted by this assay, and inadequate number of viral copies (<250 copies / mL). A negative result must be combined with clinical observations, patient history, and epidemiological information. Fact Sheet for Patients:   StrictlyIdeas.no Fact Sheet for Healthcare Providers: BankingDealers.co.za This test is not yet approved or cleared  by the [place] FDA and has been authorized for detection and/or diagnosis of SARS-CoV-2 by FDA under an Emergency Use Authorization (EUA).  This EUA will remain in effect (meaning this test can be used) for the duration of the COVID-19 declaration under Section 564(b)(1) of the Act, 21 U.S.C. section 360bbb-3(b)(1), unless the authorization is terminated or revoked sooner. Performed at  [facility], [place] [address], [place], [place] [address]   MRSA PCR Screening     Status: None   Collection Time: 07/09/19  5:33 PM   Specimen: Nasal Mucosa; Nasopharyngeal  Result Value Ref Range Status   MRSA by PCR NEGATIVE NEGATIVE Final    Comment:        The GeneXpert MRSA Assay (FDA approved for NASAL specimens only), is one component of a comprehensive MRSA colonization surveillance program. It is not intended to diagnose MRSA infection nor to guide or monitor treatment for MRSA infections. Performed at [facility], [place] [address], [place], [place] [address]       Radiology Studies: CARDIAC CATHETERIZATION  Result Date: 07/09/2019  A stent was  successfully placed.   Non-ST elevation myocardial infarction, late presenting, involving the proximal portion of the large obtuse marginal that supplies the lateral and inferoapical wall.  Successful PCI with stent implantation reducing 100% stenosis with TIMI grade 0 flow to 0% stenosis and TIMI grade III flow.  Patent left main  50% mid LAD disease and moderately severe proximal diagonal 1 and diagonal 2 disease (better treated with medication).  Diffuse luminal irregularities from proximal to distal RCA with proximal to mid 50 % narrowing.  Inferoapical moderate hypokinesis.  EF 45 to 50%.  LVEDP 17 mmHg. RECOMMENDATIONS:  Aspirin and Brilinta x12 months  Aggressive risk factor modification including 50% reduction in LDL with target less than 70.  Aerobic exercise and consideration of cardiac rehab.  Hemoglobin A1c.  Continue to cycle cardiac markers.  NM Myocar Single W/Spect W/Wall Motion And EF  Result Date: 07/09/2019 CLINICAL DATA:  Chest pain.  Acute coronary syndrome suspected. EXAM: MYOCARDIAL IMAGING WITH SPECT (REST) TECHNIQUE: Standard myocardial SPECT imaging was performed after resting intravenous injection of 10.2 Tc-78mtetrofosmin. COMPARISON:  CTA chest of earlier in the day FINDINGS: Perfusion: A large, severe rest defect involves the mid to basilar segment of the lateral and inferolateral walls. IMPRESSION: Large, severe rest defect involving the lateral and inferolateral walls. Considerations include acute ischemia or infarct. Stress images not performed. Electronically Signed   By: KAbigail MiyamotoM.D.   On: 07/09/2019 17:00   DG Chest Portable 1 View  Result Date: 07/09/2019 CLINICAL DATA:  Chest pain EXAM: PORTABLE CHEST 1 VIEW COMPARISON:  Radiograph 01/03/2003 FINDINGS: Streaky opacities in the bases likely reflect atelectasis with low volumes and central vascular crowding. Some more coarse reticular opacities are more indeterminate but could reflect chronic interstitial  change versus less likely acute interstitial inflammation. No pneumothorax or effusion. The cardiomediastinal contours are unremarkable. No acute osseous or soft tissue abnormality. Degenerative changes are present in the imaged spine and shoulders. Telemetry leads overlie the chest. IMPRESSION: 1. Streaky opacities in the bases likely reflect atelectasis with low volumes and central vascular crowding. 2. Coarse reticular opacities are indeterminate favor chronic interstitial change. Electronically Signed   By: PLovena LeM.D.   On: 07/09/2019 01:45   CT Angio Chest/Abd/Pel for Dissection W and/or Wo Contrast  Result Date: 07/09/2019 CLINICAL DATA:  Chest pain radiating to left arm with nausea. Aortic dissection suspected. EXAM: CT ANGIOGRAPHY CHEST, ABDOMEN AND PELVIS TECHNIQUE: Non-contrast CT of the chest was initially obtained. Multidetector CT imaging through the chest, abdomen and pelvis was performed using the standard protocol during bolus administration of intravenous contrast. Multiplanar reconstructed images and MIPs were obtained and reviewed to evaluate the vascular anatomy. CONTRAST:  1089mOMNIPAQUE IOHEXOL 350 MG/ML SOLN COMPARISON:  Radiograph earlier today. FINDINGS: CTA CHEST FINDINGS Cardiovascular: Thoracic aorta is normal in caliber. No dissection, aortic hematoma, or evidence of acute aortic syndrome. No significant atherosclerosis. Conventional branching pattern from the aortic arch. Heart is normal in size without pericardial effusion. No filling defects in the central pulmonary arteries to the segmental level. Mediastinum/Nodes: Diffuse esophageal wall thickening extending from the carinal level to the gastroesophageal junction. Small mediastinal lymph nodes not enlarged by size criteria. No hilar adenopathy. No suspicious thyroid nodule. Lungs/Pleura: Clear lungs. No consolidation. No pleural fluid. No pulmonary edema. Trachea and mainstem bronchi are patent. Musculoskeletal: Mild  degenerative change in the spine with Schmorl's nodes. There are no acute or suspicious osseous abnormalities. Review of the MIP images confirms the above findings. CTA ABDOMEN AND PELVIS FINDINGS VASCULAR Aorta: Normal caliber aorta without aneurysm, dissection, vasculitis or significant stenosis. Celiac: Patent without evidence of aneurysm, dissection, vasculitis or significant stenosis. SMA: Patent without evidence of aneurysm, dissection, vasculitis or significant stenosis. Renals: Both renal arteries are patent without evidence of aneurysm, dissection, vasculitis, fibromuscular dysplasia or significant stenosis. IMA: Patent without evidence of aneurysm, dissection, vasculitis or significant stenosis. Inflow: Predominantly noncalcified plaque involving the common iliac arteries, right greater than left. There is approximately 50% luminal narrowing of the right common iliac artery. No dissection or acute findings. Veins: No obvious venous abnormality within the limitations of this arterial phase study. Review of the MIP images confirms the above findings. NON-VASCULAR Hepatobiliary: No focal hepatic lesion on arterial phase exam. Possible noncalcified gallstones. No pericholecystic inflammation or biliary dilatation. Pancreas: No ductal dilatation or inflammation. Spleen: Normal in size without focal abnormality. Adrenals/Urinary Tract: Normal adrenal glands. No hydronephrosis or perinephric edema. No visualized renal calculi. Distended urinary bladder without wall thickening. Stomach/Bowel: Ingested material within the stomach. No evidence of gastric wall thickening. No small bowel obstruction or inflammatory change. There is fecalization of small bowel contents. Normal appendix. Moderate volume of stool throughout the colon. The sigmoid colon is redundant. No colonic wall thickening. Lymphatic: No adenopathy. Reproductive: Prominent prostate gland spans 5 cm transverse. Other: Fat within both inguinal canals.  No free air, free fluid, or intra-abdominal fluid collection. Musculoskeletal: There are no acute or suspicious osseous abnormalities. Review of the MIP images confirms the above findings. IMPRESSION: 1. No aortic dissection or acute aortic abnormality. 2. Diffuse esophageal wall thickening extending from the mid esophagus to the gastroesophageal junction. Findings may represent reflux or esophagitis. Length of involvement favors against neoplastic process. 3. Constipation bowel gas pattern with fecalization of small bowel contents, suggesting slow transit. 4. Distended urinary bladder without wall thickening. 5. Possible noncalcified gallstones. No gallbladder inflammation or biliary dilatation. Electronically Signed   By: Keith Rake M.D.   On: 07/09/2019 03:15     Scheduled Meds: . aspirin  81 mg Oral Daily  . atorvastatin  80 mg Oral Daily  . heparin  5,000 Units Subcutaneous Q8H  . insulin aspart  0-9 Units Subcutaneous TID WC  . insulin starter kit- pen needles  1 kit Other Once  . living well with diabetes book   Does not apply Once  . metoprolol tartrate  75 mg Oral BID  . pantoprazole (PROTONIX) IV  40 mg Intravenous Q12H  . sodium chloride flush  3 mL Intravenous Q12H  . sucralfate  1 g Oral Q6H  . ticagrelor  90 mg Oral BID   Continuous Infusions: . sodium chloride    . nitroGLYCERIN 10 mcg/min (07/09/19 2138)     LOS:  1 day   Time Spent in minutes   45 minutes  Athanasios Heldman D.O. on 07/10/2019 at 11:59 AM  Between 7am to 7pm - Please see pager noted on [email or web address]  After 7pm go to [email or web address]  And look for the night coverage person covering for me after hours  [facility] Office  [phone]

## 2019-07-10 NOTE — Progress Notes (Signed)
Subjective:  Pain has resolved completely Long talk about diet and lifestyle changes  Objective:  Vitals:   07/09/19 1931 07/10/19 0001 07/10/19 0357 07/10/19 0756  BP: 108/79 100/81 (!) 126/93 118/83  Pulse: 99 97 98 (!) 111  Resp: (!) 23 14 18 18   Temp: 97.6 F (36.4 C) 98.7 F (37.1 C) 97.8 F (36.6 C) (!) 97.5 F (36.4 C)  TempSrc: Oral Oral Oral Oral  SpO2: 98% 98% 97% 99%  Weight:      Height:        Intake/Output from previous day: No intake or output data in the 24 hours ending 07/10/19 0850  Physical Exam: Affect appropriate Healthy:  appears stated age HEENT: normal Neck supple with no adenopathy JVP normal no bruits no thyromegaly Lungs clear with no wheezing and good diaphragmatic motion Heart:  S1/S2 no murmur, no rub, gallop or click PMI normal Abdomen: benighn, BS positve, no tenderness, no AAA no bruit.  No HSM or HJR Distal pulses intact with no bruits No edema Neuro non-focal Skin warm and dry No muscular weakness Right radial A no hematoma   Lab Results: Basic Metabolic Panel: Recent Labs    07/09/19 0125 07/10/19 0212  NA 136 136  K 4.1 4.1  CL 103 103  CO2 20* 21*  GLUCOSE 369* 253*  BUN 13 10  CREATININE 0.76 0.65  CALCIUM 8.9 8.3*   Liver Function Tests: No results for input(s): AST, ALT, ALKPHOS, BILITOT, PROT, ALBUMIN in the last 72 hours. No results for input(s): LIPASE, AMYLASE in the last 72 hours. CBC: Recent Labs    07/09/19 0125 07/10/19 0212  WBC 10.1 12.5*  HGB 13.1 11.2*  HCT 42.9 36.7*  MCV 74.2* 74.9*  PLT 384 320   Hemoglobin A1C: Recent Labs    07/09/19 0520  HGBA1C 11.3*   Fasting Lipid Panel: Recent Labs    07/09/19 0520  CHOL 265*  HDL 48  LDLCALC 202*  TRIG 74  CHOLHDL 5.5    Imaging: CARDIAC CATHETERIZATION  Result Date: 07/09/2019  A stent was successfully placed.   Non-ST elevation myocardial infarction, late presenting, involving the proximal portion of the large obtuse  marginal that supplies the lateral and inferoapical wall.  Successful PCI with stent implantation reducing 100% stenosis with TIMI grade 0 flow to 0% stenosis and TIMI grade III flow.  Patent left main  50% mid LAD disease and moderately severe proximal diagonal 1 and diagonal 2 disease (better treated with medication).  Diffuse luminal irregularities from proximal to distal RCA with proximal to mid 50 % narrowing.  Inferoapical moderate hypokinesis.  EF 45 to 50%.  LVEDP 17 mmHg. RECOMMENDATIONS:  Aspirin and Brilinta x12 months  Aggressive risk factor modification including 50% reduction in LDL with target less than 70.  Aerobic exercise and consideration of cardiac rehab.  Hemoglobin A1c.  Continue to cycle cardiac markers.  NM Myocar Single W/Spect W/Wall Motion And EF  Result Date: 07/09/2019 CLINICAL DATA:  Chest pain.  Acute coronary syndrome suspected. EXAM: MYOCARDIAL IMAGING WITH SPECT (REST) TECHNIQUE: Standard myocardial SPECT imaging was performed after resting intravenous injection of 10.2 Tc-68m tetrofosmin. COMPARISON:  CTA chest of earlier in the day FINDINGS: Perfusion: A large, severe rest defect involves the mid to basilar segment of the lateral and inferolateral walls. IMPRESSION: Large, severe rest defect involving the lateral and inferolateral walls. Considerations include acute ischemia or infarct. Stress images not performed. Electronically Signed   By: Adria Devon.D.  On: 07/09/2019 17:00   DG Chest Portable 1 View  Result Date: 07/09/2019 CLINICAL DATA:  Chest pain EXAM: PORTABLE CHEST 1 VIEW COMPARISON:  Radiograph 01/03/2003 FINDINGS: Streaky opacities in the bases likely reflect atelectasis with low volumes and central vascular crowding. Some more coarse reticular opacities are more indeterminate but could reflect chronic interstitial change versus less likely acute interstitial inflammation. No pneumothorax or effusion. The cardiomediastinal contours are  unremarkable. No acute osseous or soft tissue abnormality. Degenerative changes are present in the imaged spine and shoulders. Telemetry leads overlie the chest. IMPRESSION: 1. Streaky opacities in the bases likely reflect atelectasis with low volumes and central vascular crowding. 2. Coarse reticular opacities are indeterminate favor chronic interstitial change. Electronically Signed   By: Kreg Shropshire M.D.   On: 07/09/2019 01:45   CT Angio Chest/Abd/Pel for Dissection W and/or Wo Contrast  Result Date: 07/09/2019 CLINICAL DATA:  Chest pain radiating to left arm with nausea. Aortic dissection suspected. EXAM: CT ANGIOGRAPHY CHEST, ABDOMEN AND PELVIS TECHNIQUE: Non-contrast CT of the chest was initially obtained. Multidetector CT imaging through the chest, abdomen and pelvis was performed using the standard protocol during bolus administration of intravenous contrast. Multiplanar reconstructed images and MIPs were obtained and reviewed to evaluate the vascular anatomy. CONTRAST:  OMNIPAQUE IOHEXOL 350 MG/ML SOLN COMPARISON:  Radiograph earlier today. FINDINGS: CTA CHEST FINDINGS Cardiovascular: Thoracic aorta is normal in caliber. No dissection, aortic hematoma, or evidence of acute aortic syndrome. No significant atherosclerosis. Conventional branching pattern from the aortic arch. Heart is normal in size without pericardial effusion. No filling defects in the central pulmonary arteries to the segmental level. Mediastinum/Nodes: Diffuse esophageal wall thickening extending from the carinal level to the gastroesophageal junction. Small mediastinal lymph nodes not enlarged by size criteria. No hilar adenopathy. No suspicious thyroid nodule. Lungs/Pleura: Clear lungs. No consolidation. No pleural fluid. No pulmonary edema. Trachea and mainstem bronchi are patent. Musculoskeletal: Mild degenerative change in the spine with Schmorl's nodes. There are no acute or suspicious osseous abnormalities. Review of the  MIP images confirms the above findings. CTA ABDOMEN AND PELVIS FINDINGS VASCULAR Aorta: Normal caliber aorta without aneurysm, dissection, vasculitis or significant stenosis. Celiac: Patent without evidence of aneurysm, dissection, vasculitis or significant stenosis. SMA: Patent without evidence of aneurysm, dissection, vasculitis or significant stenosis. Renals: Both renal arteries are patent without evidence of aneurysm, dissection, vasculitis, fibromuscular dysplasia or significant stenosis. IMA: Patent without evidence of aneurysm, dissection, vasculitis or significant stenosis. Inflow: Predominantly noncalcified plaque involving the common iliac arteries, right greater than left. There is approximately 50% luminal narrowing of the right common iliac artery. No dissection or acute findings. Veins: No obvious venous abnormality within the limitations of this arterial phase study. Review of the MIP images confirms the above findings. NON-VASCULAR Hepatobiliary: No focal hepatic lesion on arterial phase exam. Possible noncalcified gallstones. No pericholecystic inflammation or biliary dilatation. Pancreas: No ductal dilatation or inflammation. Spleen: Normal in size without focal abnormality. Adrenals/Urinary Tract: Normal adrenal glands. No hydronephrosis or perinephric edema. No visualized renal calculi. Distended urinary bladder without wall thickening. Stomach/Bowel: Ingested material within the stomach. No evidence of gastric wall thickening. No small bowel obstruction or inflammatory change. There is fecalization of small bowel contents. Normal appendix. Moderate volume of stool throughout the colon. The sigmoid colon is redundant. No colonic wall thickening. Lymphatic: No adenopathy. Reproductive: Prominent prostate gland spans 5 cm transverse. Other: Fat within both inguinal canals. No free air, free fluid, or intra-abdominal fluid collection. Musculoskeletal: There are  no acute or suspicious osseous  abnormalities. Review of the MIP images confirms the above findings. IMPRESSION: 1. No aortic dissection or acute aortic abnormality. 2. Diffuse esophageal wall thickening extending from the mid esophagus to the gastroesophageal junction. Findings may represent reflux or esophagitis. Length of involvement favors against neoplastic process. 3. Constipation bowel gas pattern with fecalization of small bowel contents, suggesting slow transit. 4. Distended urinary bladder without wall thickening. 5. Possible noncalcified gallstones. No gallbladder inflammation or biliary dilatation. Electronically Signed   By: Narda Rutherford M.D.   On: 07/09/2019 03:15    Cardiac Studies:  ECG: SR mild J point elevation in V56   Telemetry:  NSR no arrhythmia  Echo:   Medications:   . aspirin  81 mg Oral Daily  . atorvastatin  80 mg Oral Daily  . atorvastatin  80 mg Oral Daily  . heparin  5,000 Units Subcutaneous Q8H  . insulin aspart  0-9 Units Subcutaneous TID WC  . metoprolol tartrate  75 mg Oral BID  . pantoprazole (PROTONIX) IV  40 mg Intravenous Q12H  . sodium chloride flush  3 mL Intravenous Q12H  . sucralfate  1 g Oral Q6H  . ticagrelor  90 mg Oral BID     . sodium chloride    . nitroGLYCERIN 10 mcg/min (07/09/19 2138)    Assessment/Plan:   1. SEMI:  Total OM post stenting with nice result. Small lateral infarct increased risk rupture. Increase lopressor to 75 mg bid continue DAT. Ambulate echo pending would observe for 24 hours possible d/c in am   2. HLD:  LDL 202 start high dose lipitor 80 mg daily   3. GI on sucralfate and protonix   4. DM:  A1c over 11 ? Start [place] [facility] has not seen [facility] in long time needs close f/u with this !!  Charlton Haws 07/10/2019, 8:50 AM

## 2019-07-10 NOTE — Progress Notes (Addendum)
Inpatient Diabetes Program Recommendations  AACE/ADA: New Consensus Statement on Inpatient Glycemic Control (2015)  Target Ranges:  Prepandial:   less than 140 mg/dL      Peak postprandial:   less than 180 mg/dL (1-2 hours)      Critically ill patients:  140 - 180 mg/dL   Lab Results  Component Value Date   GLUCAP 205 (H) 07/10/2019   HGBA1C 11.3 (H) 07/09/2019    Review of Glycemic Control Results for Shawn Landry, Shawn Landry (MRN [id number]) as of 07/10/2019 09:44  Ref. Range 07/09/2019 08:06 07/09/2019 13:42 07/09/2019 19:09 07/09/2019 21:29 07/10/2019 06:13  Glucose-Capillary Latest Ref Range: 70 - 99 mg/dL 417 (H) 408 (H) 144 (H) 258 (H) 205 (H)   Diabetes history: DM 2-NEW DIAGNOSIS Outpatient Diabetes medications:  None Current orders for Inpatient glycemic control:  Novolog sensitive tid with meals  Inpatient Diabetes Program Recommendations:   -Lantus 15 units qd (0.2 units/kg x 77.1 kg) -Add Novolog 0-5 unit hs correction  Ordered pen needle starter to prepare patient if going home on insulin along with Living Well With Diabetes. Nurses, please allow patient to administer own insulin injections while in the hospital so can get used to administering injections.  Spoke with Dr. Catha Gosselin and agree patient shows motivation to change nutrition intake and exercise significantly so agree orals to start and followup with PCP. Consider on Discharge  -Metformin & Jardiance -Glucose meter [id number] - Continuous glucose monitor (have Josephine Igo available to give patient for discharge use)   Thank you, Billy Fischer. Alyssa Mancera, RN, MSN, CDE  Diabetes Coordinator Inpatient Glycemic Control Team Team Pager [phone] (8am-5pm) 07/10/2019 9:52 AM

## 2019-07-10 NOTE — Plan of Care (Signed)
  Problem: Education: Goal: Knowledge of General Education information will improve Description Including pain rating scale, medication(s)/side effects and non-pharmacologic comfort measures Outcome: Progressing   

## 2019-07-10 NOTE — Progress Notes (Addendum)
CARDIAC REHAB PHASE I   PRE:  Rate/Rhythm: ST 106  BP:  Sitting: 106/74        SaO2: 98% RA  MODE:  Ambulation: 680 ft SaO2: 97%  POST:  Rate/Rhythm: ST 108 BP:  Sitting: 110/81      SaO2: 99% RA  1310-1440  Pt received in bed, agrees to walk. Pt ambulated with steady gait and no complaints during walk. Pt back to bed with call light in reach and brother at side. Provided patient with stent card and was encouraged to copy/carry. Provided MI education booklet and discussed recovery, restrictions and safety s/p NSTEMI. Reviewed wound care of right wrist and restrictions s/p cath. Stressed to patient to take all medications as prescribed, especially Asa/Brilinta. Stressed f/u with MD. Dicussed risk factors: HTN, Cholesterol, DM, Stress. Provided information on Diabetic, heart healthy, and low Na diets. Reviewed use and storage of NTG. Dicussed activity/exercise and s/s to terminate. Discussed phase 2 CR, referred to [facility]. Pt and brother verbalized understanding of education, all questions answered.   Pt is interested in participating in Virtual Cardiac and Pulmonary Rehab. Pt advised that Virtual Cardiac and Pulmonary Rehab is provided at no cost to the patient.  Checklist:  1. Pt has smart device  ie smartphone and/or ipad for downloading an app  Yes 2. Reliable internet/wifi service    Yes 3. Understands how to use their smartphone and navigate within an app.  Yes   Pt verbalized understanding and is in agreement.   Lorin Picket, MS, ACSM EP-C, [facility] 07/10/2019  2:25 PM

## 2019-07-10 NOTE — Telephone Encounter (Signed)
Patient has TOC appt with Ledon Snare on 07/22/19 at 2:15pm.

## 2019-07-11 DIAGNOSIS — E785 Hyperlipidemia, unspecified: Secondary | ICD-10-CM

## 2019-07-11 LAB — BASIC METABOLIC PANEL
Anion gap: 9 (ref 5–15)
BUN: 9 mg/dL (ref 6–20)
CO2: 23 mmol/L (ref 22–32)
Calcium: 8.4 mg/dL — ABNORMAL LOW (ref 8.9–10.3)
Chloride: 103 mmol/L (ref 98–111)
Creatinine, Ser: 0.69 mg/dL (ref 0.61–1.24)
GFR calc Af Amer: >60 mL/min (ref >60)
GFR calc non Af Amer: >60 mL/min (ref >60)
Glucose, Bld: 192 mg/dL — ABNORMAL HIGH (ref 70–99)
Potassium: 3.7 mmol/L (ref 3.5–5.1)
Sodium: 135 mmol/L (ref 135–145)

## 2019-07-11 LAB — CBC
HCT: 37.7 % — ABNORMAL LOW (ref 39.0–52.0)
Hemoglobin: 11.4 g/dL — ABNORMAL LOW (ref 13.0–17.0)
MCH: 22.5 pg — ABNORMAL LOW (ref 26.0–34.0)
MCHC: 30.2 g/dL (ref 30.0–36.0)
MCV: 74.5 fL — ABNORMAL LOW (ref 80.0–100.0)
Platelets: 291 10*3/uL (ref 150–400)
RBC: 5.06 MIL/uL (ref 4.22–5.81)
RDW: 17.7 % — ABNORMAL HIGH (ref 11.5–15.5)
WBC: 9.7 10*3/uL (ref 4.0–10.5)
nRBC: 0 % (ref 0.0–0.2)

## 2019-07-11 LAB — GLUCOSE, CAPILLARY: Glucose-Capillary: 181 mg/dL — ABNORMAL HIGH (ref 70–99)

## 2019-07-11 MED ORDER — ATORVASTATIN CALCIUM 80 MG PO TABS: 80.0000 mg | ORAL_TABLET | Freq: Every day | ORAL | 1 refills | Status: DC

## 2019-07-11 MED ORDER — EMPAGLIFLOZIN 10 MG PO TABS: 10.0000 mg | ORAL_TABLET | Freq: Every day | ORAL | 3 refills | Status: AC

## 2019-07-11 MED ORDER — BLOOD GLUCOSE METER KIT: PACK | 0 refills | Status: AC

## 2019-07-11 MED ORDER — METFORMIN HCL 500 MG PO TABS: 500.0000 mg | ORAL_TABLET | Freq: Two times a day (BID) | ORAL | 1 refills | Status: AC

## 2019-07-11 MED ORDER — PANTOPRAZOLE SODIUM 40 MG PO TBEC
40.0000 mg | DELAYED_RELEASE_TABLET | Freq: Every day | ORAL | 1 refills | Status: DC
Start: 2019-07-11 — End: 2020-08-26

## 2019-07-11 MED ORDER — FREESTYLE LIBRE 14 DAY SENSOR MISC: 1 refills | Status: DC

## 2019-07-11 MED ORDER — NITROGLYCERIN 0.4 MG SL SUBL: 0.4000 mg | SUBLINGUAL_TABLET | SUBLINGUAL | 0 refills | Status: DC | PRN

## 2019-07-11 MED ORDER — FREESTYLE LIBRE 2 SENSOR MISC: 1 refills | Status: AC

## 2019-07-11 MED ORDER — METOPROLOL TARTRATE 75 MG PO TABS: 75.0000 mg | ORAL_TABLET | Freq: Two times a day (BID) | ORAL | 1 refills | Status: DC

## 2019-07-11 MED ORDER — TICAGRELOR 90 MG PO TABS: 90.0000 mg | ORAL_TABLET | Freq: Two times a day (BID) | ORAL | 3 refills | Status: DC

## 2019-07-11 NOTE — Plan of Care (Signed)
Nutrition Education Note  RD consulted for nutrition education regarding diabetes, hyperlipidemia.  Spoke with pt and pt's brother at bedside. Pt expresses feeling overwhelmed with the amount of new information he has received over the last few days ("information overload"). RD allowed pt to share the information that he has learned and provided reinforcement of the education given by other providers.  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 Pt reports that he packs a large ziplock back with nuts (peanuts, walnuts, cashews), dates, small oranges, other fruits, and a bagel and snacks on this throughout the day. Pt reports "my hand is in and out of the bag." Pt states that he does not take time to sit down and have full meals until he is done with work around 6:00 pm.  Lab Results  Component Value Date   HGBA1C 11.3 (H) 07/09/2019    RD provided "Heart Healthy Consistent Carbohydrate Nutrition Therapy" handout from the Academy of Nutrition and Dietetics. Also provided "Heart Healthy Cooking Tips," "Heart Healthy Shopping Tips," and "Diabetes Label Reading Tips" handouts from AND.  Discussed different food groups and their effects on blood sugar, emphasizing carbohydrate-containing foods. Provided list of carbohydrates and recommended serving sizes of common foods.  Discussed importance of controlled and consistent carbohydrate intake throughout the day. Provided examples of ways to balance meals/snacks and encouraged intake of high-fiber, whole grain complex carbohydrates. Teach back method used.  Lipid Panel     Component Value Date/Time   CHOL 265 (H) 07/09/2019 0520   TRIG 74 07/09/2019 0520   HDL 48 07/09/2019 0520   CHOLHDL 5.5 07/09/2019 0520   VLDL 15 07/09/2019 0520   LDLCALC 202 (H) 07/09/2019 0520    Provided examples on ways to decrease sodium and saturated fat intake in diet. Discouraged intake of processed foods and use  of salt shaker. Encouraged fresh fruits and vegetables as well as whole grain sources of carbohydrates to maximize fiber intake. Teach back method used.  Expect excellent compliance.  Body mass index is 28.29 kg/m. Pt meets criteria for overweight based on current BMI.  Current diet order is Heart Healthy/Carb Modified, patient is consuming approximately 100% of meals at this time. Labs and medications reviewed. No further nutrition interventions warranted at this time. RD contact information provided. If additional nutrition issues arise, please re-consult RD.   Earma Reading, MS, RD, LDN Inpatient Clinical Dietitian Pager: [phone] Weekend/After Hours: [phone]

## 2019-07-11 NOTE — Progress Notes (Signed)
Inpatient Diabetes Program Recommendations  AACE/ADA: New Consensus Statement on Inpatient Glycemic Control (2015)  Target Ranges:  Prepandial:   less than 140 mg/dL      Peak postprandial:   less than 180 mg/dL (1-2 hours)      Critically ill patients:  140 - 180 mg/dL   Lab Results  Component Value Date   GLUCAP 181 (H) 07/11/2019   HGBA1C 11.3 (H) 07/09/2019    Review of Glycemic Control  Inpatient Diabetes Program Recommendations:   Reviewed and educated on use of Libre 2 and applied per order on back of Right arm. Reviewed goals of CBGs and review with physicians on appointments.  Thank you, Billy Fischer. Frimy Uffelman, RN, MSN, CDE  Diabetes Coordinator Inpatient Glycemic Control Team Team Pager [phone] (8am-5pm) 07/11/2019 11:15 AM

## 2019-07-11 NOTE — Progress Notes (Signed)
Cardiology Office Note   Date:  07/19/2019   ID:  Shawn Landry, DOB 05/31/1970, MRN [id number]  PCP:  Antony Contras, MD  Cardiologist: Dr. Johnsie Cancel, MD   Chief Complaint  Patient presents with  . Follow-up  . Hospitalization Follow-up    History of Present Illness: Shawn Landry is a 49 y.o. male who presents for post hospital follow-up, seen for Dr.Nishan   [name] has a history of hyperlipidemia and GERD who presented to [facility] on 07/09/2019 for the evaluation of chest pain.  Patient reported that he was on his way home from work the evening prior to presentation when he had sudden onset of severe chest pain/pressure with radiation to his bilateral shoulders and occasional left arm involvement.  He did have associated shortness of breath however he felt this was due to the pain.  Was experiencing some diaphoretic episodes as well.  He reported a history of sudden cardiac death in his brother at age 64yo.   He was admitted with cardiology consultation.  Plan was initially for cardiac CTA however patient's heart rate was too elevated to proceed therefore he was scheduled for a Lexiscan stress test.  In nuclear medicine, patient continued to have ongoing chest pain.  Troponin levels found to be elevated from 23>> 155>> 420.  Stress test was canceled and plan was for [facility] which was performed on 07/09/2019 with proximal OM stenosis with DES/PCI placement.  Also noted to have 50% mid LAD, moderate to severe proximal diagonal disease in 1 and 2 and proximal to mid RCA disease at 50% all of which are to be treated medically for now.  EF was noted to be 45 to 50% with follow-up echocardiogram showing EF at 50 to 55% with no valvular disease.  Today [name] is here for follow up and reports he has had no anginal symptoms since hospital discharge.  He is established with a PCP, Dr. Moreen Fowler.  He has been compliant with his medications including ASA and Brilinta.  ASA on medication list recorded as  weekly however he has been taking this daily (thankfully).  Reports he was seen by Dr. Moreen Fowler last week with great lab results.  He shows me his blood glucose levels over the last week and most readings are typically in the 70-80 range.  Congratulated on this.  Discussed diet and exercise changes in depth.  He has lots of questions regarding nutrition.  Questions answered.  Denies chest pain, shortness of breath, LE edema, orthopnea, PND, dizziness or syncope.  Is asking about cardiac rehabilitation as he would like to participate.  Cath site is stable without signs of hematoma or bleeding.  Is tolerating all other medications well.  BP is stable at 112/64.  Discussed the need to get LDL under better control.  Past Medical History:  Diagnosis Date  . Coronary artery disease   . GERD (gastroesophageal reflux disease)   . Hyperlipidemia     Past Surgical History:  Procedure Laterality Date  . CORONARY STENT INTERVENTION  07/09/2019  . CORONARY STENT INTERVENTION N/A 07/09/2019   Procedure: CORONARY STENT INTERVENTION;  Surgeon: Belva Crome, MD;  Location: [facility];  Service: Cardiovascular;  Laterality: N/A;  . LEFT HEART CATH AND CORONARY ANGIOGRAPHY N/A 07/09/2019   Procedure: LEFT HEART CATH AND CORONARY ANGIOGRAPHY;  Surgeon: Belva Crome, MD;  Location: [facility];  Service: Cardiovascular;  Laterality: N/A;  . ROTATOR CUFF REPAIR Right  Current Outpatient Medications  Medication Sig Dispense Refill  . aspirin EC 81 MG tablet Take 81 mg by mouth once a week.    Marland Kitchen atorvastatin (LIPITOR) 80 MG tablet Take 1 tablet (80 mg total) by mouth daily. 30 tablet 1  . blood glucose meter kit and supplies Dispense based on patient and insurance preference. Use up to four times daily as directed. (FOR ICD-10 E10.9, E11.9). 1 each 0  . Continuous Blood Gluc Sensor (FREESTYLE LIBRE 2 SENSOR) MISC Use every 14 days 2 each 1  . empagliflozin (JARDIANCE) 10 MG TABS tablet Take 1  tablet (10 mg total) by mouth daily before breakfast. 30 tablet 3  . metFORMIN (GLUCOPHAGE) 500 MG tablet Take 1 tablet (500 mg total) by mouth 2 (two) times daily with a meal. 60 tablet 1  . Metoprolol Tartrate 75 MG TABS Take 75 mg by mouth 2 (two) times daily. 30 tablet 1  . nitroGLYCERIN (NITROSTAT) 0.4 MG SL tablet Place 1 tablet (0.4 mg total) under the tongue every 5 (five) minutes x 3 doses as needed for chest pain. 30 tablet 0  . pantoprazole (PROTONIX) 40 MG tablet Take 1 tablet (40 mg total) by mouth daily. 30 tablet 1  . ticagrelor (BRILINTA) 90 MG TABS tablet Take 1 tablet (90 mg total) by mouth 2 (two) times daily. 60 tablet 3   No current facility-administered medications for this visit.    Allergies:   Patient has no known allergies.    Social History:  The patient  reports that he quit smoking about 11 years ago. He has never used smokeless tobacco. He reports current alcohol use. He reports that he does not use drugs.   Family History:  The patient's family history includes Sudden Cardiac Death (age of onset: 39) in his brother.    ROS:  Please see the history of present illness.   Otherwise, review of systems are positive for none. All other systems are reviewed and negative.    PHYSICAL EXAM: VS:  BP 112/64   Pulse 89   Ht 5' 5"  (1.651 m)   Wt 161 lb (73 kg)   SpO2 99%   BMI 26.79 kg/m  , BMI Body mass index is 26.79 kg/m.   General: Well developed, well nourished, NAD Neck: Negative for carotid bruits. No JVD Lungs:Clear to ausculation bilaterally. No wheezes, rales, or rhonchi. Breathing is unlabored. Cardiovascular: RRR with S1 S2. No murmurs Extremities: No edema. No clubbing or cyanosis. DP/PT pulses 2+ bilaterally Neuro: Alert and oriented. No focal deficits. No facial asymmetry. MAE spontaneously. Psych: Responds to questions appropriately with normal affect.    EKG:  EKG is not ordered today.  Recent Labs: 07/11/2019: BUN 9; Creatinine, Ser 0.69;  Hemoglobin 11.4; Platelets 291; Potassium 3.7; Sodium 135    Lipid Panel    Component Value Date/Time   CHOL 265 (H) 07/09/2019 0520   TRIG 74 07/09/2019 0520   HDL 48 07/09/2019 0520   CHOLHDL 5.5 07/09/2019 0520   VLDL 15 07/09/2019 0520   LDLCALC 202 (H) 07/09/2019 0520    Wt Readings from Last 3 Encounters:  07/19/19 161 lb (73 kg)  07/09/19 170 lb (77.1 kg)    Other studies Reviewed: Additional studies/ records that were reviewed today include: Review of the above records demonstrates:  [facility] 07/09/2019:   A stent was successfully placed.    Non-ST elevation myocardial infarction, late presenting, involving the proximal portion of the large obtuse marginal that supplies the lateral and  inferoapical wall.  Successful PCI with stent implantation reducing 100% stenosis with TIMI grade 0 flow to 0% stenosis and TIMI grade III flow.  Patent left main   50% mid LAD disease and moderately severe proximal diagonal 1 and diagonal 2 disease (better treated with medication).  Diffuse luminal irregularities from proximal to distal RCA with proximal to mid 50 % narrowing.  Inferoapical moderate hypokinesis.  EF 45 to 50%.  LVEDP 17 mmHg.  RECOMMENDATIONS:   Aspirin and Brilinta x12 months  Aggressive risk factor modification including 50% reduction in LDL with target less than 70.  Aerobic exercise and consideration of cardiac rehab.  Hemoglobin A1c.  Continue to cycle cardiac markers.  Echocardiogram 07/09/2019:  1. Left ventricular ejection fraction, by estimation, is 50 to 55%. The  left ventricle has low normal function. The left ventricle demonstrates  regional wall motion abnormalities (see scoring diagram/findings for  description). Left ventricular diastolic  parameters were normal.  2. Right ventricular systolic function is normal. The right ventricular  size is normal. Tricuspid regurgitation signal is inadequate for assessing  PA pressure.  3. The  mitral valve is grossly normal. No evidence of mitral valve  regurgitation. No evidence of mitral stenosis.  4. The aortic valve is tricuspid. Aortic valve regurgitation is not  visualized. No aortic stenosis is present.  5. The inferior vena cava is dilated in size with >50% respiratory  variability, suggesting right atrial pressure of 8 mmHg.   ASSESSMENT AND PLAN:  1. CAD s/p NSTEMI: -LHC with 100% stenosis in the proximal OM s/p DES/PCI.  Also noted to have 50% mid LAD stenosis, moderate to severe proximal diagonal disease in 1 and 2 as well as proximal to mid RCA stenosis at 50%>> to treat medically -Plan is for DAPT with ASA and Brilinta x1 year>> tolerating well without signs or symptoms of hematoma or bleeding -Continue metoprolol, aspirin, Brilinta, statin -Echocardiogram EF 89-37%, LV diastolic parameters were normal -Plan for close follow-up  2. New onset diabetes mellitus, type IIwith hyperglycemia: -Hemoglobin A1c 11.3, no prior for comparison -Diabetes coordinator/nutrition consulted and appreciated -Given patient's motivation, felt that oral regimen may be warranted -Patient discharged on Jardiance and Metformin>> needs follow-up with PCP -Followed with PCP last week -Appears he is responding well to Jardiance and Metformin>> did discuss possible need to discontinue these at a later date if doing well with acceptable A1c  3. Hyperlipidemia -Lipidpanel shows total cholesterol 265, HDL 48, LDL 202, triglycerides 74 -Continue statin>> tolerating well -Will plan for repeat lipid and LFTs in 6 weeks   4. GERD -Continue PPI   Current medicines are reviewed at length with the patient today.  The patient does not have concerns regarding medicines.  The following changes have been made:  no change  Labs/ tests ordered today include: Lipid panel/LFTs  No orders of the defined types were placed in this encounter.   Disposition:   FU with Dr. Johnsie Cancel in 8  weeks  Signed, Kathyrn Drown, NP  07/19/2019 2:45 PM    [facility] [address], [place], [place]  [address] Phone: [phone]; Fax: [phone]

## 2019-07-11 NOTE — Progress Notes (Signed)
Removed PIV access x 2 and patient received discharge instructions. Pt's brother took his all belongings. HS [employer]

## 2019-07-11 NOTE — TOC Transition Note (Signed)
Transition of Care [facility]) - CM/SW Discharge Note   Patient Details  Name: Shawn Landry MRN: [id number] Date of Birth: [birth year]  Transition of Care [facility]) CM/SW Contact:  Zenon Mayo, RN Phone Number: 07/11/2019, 9:44 AM   Clinical Narrative:    NCM spoke with patient, gave him the 30 day free coupon for brilinta,  Informed him that NCM spoke with [facility]ist at [employer] [address] and he states his copay is around 30.00.  INformed patient is his deductible is met he can use the 5.00 co pay card also after using the 30 day free coupon.     Final next level of care: Home/Self Care Barriers to Discharge: No Barriers Identified   Patient Goals and CMS Choice        Discharge Placement                       Discharge Plan and Services                                     Social Determinants of Health (SDOH) Interventions     Readmission Risk Interventions No flowsheet data found.

## 2019-07-11 NOTE — Progress Notes (Addendum)
CARDIAC REHAB PHASE I   PRE:  Rate/Rhythm: ST 100  BP:  Sitting: 108/77        SaO2: 96% RA  MODE:  Ambulation: 400 ft ST 121  SaO2: 97%  POST:  Rate/Rhythm: ST 108  BP:  Sitting: 108/53        SaO2: 97% RA  0935 -9:59  Pt ambulated full circle with strong, steady gait. No complaints. Pt back to bedside with call bell at side and Diabetes educator in room. Pt had no questions at this time is is excited to be going home. We did discuss the emotional impact of this event and pt was encouraged to talk with his brother, who is very close to, if needed. Pt voices he is very eagar to start CRP2.   Lorin Picket, MS, ACSM EP-C, [facility] 07/11/2019  9:53 AM

## 2019-07-11 NOTE — Discharge Instructions (Signed)
Acute Coronary Syndrome Acute coronary syndrome (ACS) is a serious problem in which there is suddenly not enough blood and oxygen reaching the heart. ACS can result in chest pain or a heart attack. This condition is a medical emergency. If you have any symptoms of this condition, get help right away. What are the causes? This condition may be caused by:  A buildup of fat and cholesterol inside the arteries (atherosclerosis). This is the most common cause. The buildup (plaque) can cause blood vessels in the heart (coronary arteries) to become narrow or blocked, which reduces blood flow to the heart. Plaque can also break off and lead to a clot, which can block an artery and cause a heart attack or stroke.  Sudden tightening of the muscles around the coronary arteries (coronary spasm).  Tearing of a coronary artery (spontaneous coronary artery dissection).  Very low blood pressure (hypotension).  An abnormal heartbeat (arrhythmia).  Other medical conditions that cause a decrease of oxygen to the heart, such as anemiaorrespiratory failure.  Using cocaine or methamphetamine. What increases the risk? The following factors may make you more likely to develop this condition:  Age. The risk for ACS increases as you get older.  History of chest pain, heart attack, peripheral artery disease, or stroke.  Having taken chemotherapy or immune-suppressing medicines.  Being male.  Family history of chest pain, heart disease, or stroke.  Smoking.  Not exercising enough.  Being overweight.  High cholesterol.  High blood pressure (hypertension).  Diabetes.  Excessive alcohol use. What are the signs or symptoms? Common symptoms of this condition include:  Chest pain. The pain may last a long time, or it may stop and come back (recur). It may feel like: ? Crushing or squeezing. ? Tightness, pressure, fullness, or heaviness.  Arm, neck, jaw, or back pain.  Heartburn or  indigestion.  Shortness of breath.  Nausea.  Sudden cold sweats.  Light-headedness.  Dizziness or passing out.  Tiredness (fatigue). Sometimes there are no symptoms. How is this diagnosed? This condition may be diagnosed based on:  Your medical history and symptoms.  Imaging tests, such as: ? An electrocardiogram (ECG). This measures the heart's electrical activity. ? X-rays. ? CT scan. ? A coronary angiogram. For this test, dye is injected into the heart arteries and then X-rays are taken. ? Myocardial perfusion imaging. This test shows how well blood flows through your heart muscle.  Blood tests. These may be repeated at certain time intervals.  Exercise stress testing.  Echocardiogram. This is a test that uses sound waves to produce detailed images of the heart. How is this treated? Treatment for this condition may include:  Oxygen therapy.  Medicines, such as: ? Antiplatelet medicines and blood-thinning medicines, such as aspirin. These help prevent blood clots. ? Medicine that dissolves any blood clots (fibrinolytic therapy). ? Blood pressure medicines. ? Nitroglycerin. This helps widen blood vessels to improve blood flow. ? Pain medicine. ? Cholesterol-lowering medicine.  Surgery, such as: ? Coronary angioplasty with stent placement. This involves placing a small piece of metal that looks like mesh or a spring into a narrow coronary artery. This widens the artery and keeps it open. ? Coronary artery bypass surgery. This involves taking a section of a blood vessel from a different part of your body and placing it on the blocked coronary artery to allow blood to flow around the blockage.  Cardiac rehabilitation. This is a program that includes exercise training, education, and counseling to help you recover.   Follow these instructions at home: Eating and drinking  Eat a heart-healthy diet that includes whole grains, fruits and vegetables, lean proteins, and  low-fat or nonfat dairy products.  Limit how much salt (sodium) you eat as told by your health care provider. Follow instructions from your health care provider about any other eating or drinking restrictions, such as limiting foods that are high in fat and processed sugars.  Use healthy cooking methods such as roasting, grilling, broiling, baking, poaching, steaming, or stir-frying.  Work with a dietitian to follow a heart-healthy eating plan. Medicines  Take over-the-counter and prescription medicines only as told by your health care provider.  Do not take these medicines unless your health care provider approves: ? Vitamin supplements that contain vitamin A or vitamin E. ? NSAIDs, such as ibuprofen, naproxen, or celecoxib. ? Hormone replacement therapy that contains estrogen.  If you are taking blood thinners: ? Talk with your health care provider before you take any medicines that contain aspirin or NSAIDs. These medicines increase your risk for dangerous bleeding. ? Take your medicine exactly as told, at the same time every day. ? Avoid activities that could cause injury or bruising, and follow instructions about how to prevent falls. ? Wear a medical alert bracelet, and carry a card that lists what medicines you take. Activity  Follow your cardiac rehabilitation program. Do exercises as told by your physical therapist.  Ask your health care provider what activities and exercises are safe for you. Follow his or her instructions about lifting, driving, or climbing stairs. Lifestyle  Do not use any products that contain nicotine or tobacco, such as cigarettes, e-cigarettes, and chewing tobacco. If you need help quitting, ask your health care provider.  Do not drink alcohol if your health care provider tells you not to drink.  If you drink alcohol: ? Limit how much you have to 0-1 drink a day. ? Be aware of how much alcohol is in your drink. In the [place] one drink equals one 12 oz  bottle of beer (355 mL), one 5 oz glass of wine (148 mL), or one 1 oz glass of hard liquor (44 mL).  Maintain a healthy weight. If you need to lose weight, work with your health care provider to do so safely. General instructions  Tell all the health care providers who provide care for you about your heart condition, including your dentist. This may affect the medicines or treatment you receive.  Manage any other health conditions you have, such as hypertension or diabetes. These conditions affect your heart.  Pay attention to your mental health. You may be at higher risk for depression. ? Find ways to manage stress. ? Talk to your health care provider about depression screening and treatment.  Keep your vaccinations up to date. ? Get the flu shot (influenza vaccine) every year. ? Get the pneumococcal vaccine if you are age 65 or older.  If directed, monitor your blood pressure at home.  Keep all follow-up visits as told by your health care provider. This is important. Contact a health care provider if you:  Feel overwhelmed or sad.  Have trouble doing your daily activities. Get help right away if you:  Have pain in your chest, neck, arm, jaw, stomach, or back that recurs, and: ? It lasts for more than a few minutes. ? It is not relieved by taking the medicineyour health care provider prescribed.  Have unexplained: ? Heavy sweating. ? Heartburn or indigestion. ? Nausea or vomiting. ?   Shortness of breath. ? Difficulty breathing. ? Fatigue. ? Nervousness or anxiety. ? Weakness. ? Diarrhea. ? Dark stools or blood in your stool.  Have sudden light-headedness or dizziness.  Have blood pressure that is higher than 180/120.  Faint.  Have thoughts about hurting yourself. These symptoms may represent a serious problem that is an emergency. Do not wait to see if the symptoms will go away. Get medical help right away. Call your local emergency services (911 in the [place] Do  not drive yourself to the hospital.  Summary  Acute coronary syndrome (ACS) is when there is not enough blood and oxygen being supplied to the heart. ACS can result in chest pain or a heart attack.  Acute coronary syndrome is a medical emergency. If you have any symptoms of this condition, get help right away.  Treatment includes medicines and procedures to open the blocked arteries and restore blood flow. This information is not intended to replace advice given to you by your health care provider. Make sure you discuss any questions you have with your health care provider. Document Revised: 07/04/2018 Document Reviewed: 02/12/2018 Elsevier Patient Education  [place].  Diabetes Mellitus and Nutrition, Adult When you have diabetes (diabetes mellitus), it is very important to have healthy eating habits because your blood sugar (glucose) levels are greatly affected by what you eat and drink. Eating healthy foods in the appropriate amounts, at about the same times every day, can help you:  Control your blood glucose.  Lower your risk of heart disease.  Improve your blood pressure.  Reach or maintain a healthy weight. Every person with diabetes is different, and each person has different needs for a meal plan. Your health care provider may recommend that you work with a diet and nutrition specialist (dietitian) to make a meal plan that is best for you. Your meal plan may vary depending on factors such as:  The calories you need.  The medicines you take.  Your weight.  Your blood glucose, blood pressure, and cholesterol levels.  Your activity level.  Other health conditions you have, such as heart or kidney disease. How do carbohydrates affect me? Carbohydrates, also called carbs, affect your blood glucose level more than any other type of food. Eating carbs naturally raises the amount of glucose in your blood. Carb counting is a method for keeping track of how many carbs you  eat. Counting carbs is important to keep your blood glucose at a healthy level, especially if you use insulin or take certain oral diabetes medicines. It is important to know how many carbs you can safely have in each meal. This is different for every person. Your dietitian can help you calculate how many carbs you should have at each meal and for each snack. Foods that contain carbs include:  Bread, cereal, rice, pasta, and crackers.  Potatoes and corn.  Peas, beans, and lentils.  Milk and yogurt.  Fruit and juice.  Desserts, such as cakes, cookies, ice cream, and candy. How does alcohol affect me? Alcohol can cause a sudden decrease in blood glucose (hypoglycemia), especially if you use insulin or take certain oral diabetes medicines. Hypoglycemia can be a life-threatening condition. Symptoms of hypoglycemia (sleepiness, dizziness, and confusion) are similar to symptoms of having too much alcohol. If your health care provider says that alcohol is safe for you, follow these guidelines:  Limit alcohol intake to no more than 1 drink per day for nonpregnant women and 2 drinks per day for  men. One drink equals 12 oz of beer, 5 oz of wine, or 1 oz of hard liquor.  Do not drink on an empty stomach.  Keep yourself hydrated with water, diet soda, or unsweetened iced tea.  Keep in mind that regular soda, juice, and other mixers may contain a lot of sugar and must be counted as carbs. What are tips for following this plan?  Reading food labels  Start by checking the serving size on the "Nutrition Facts" label of packaged foods and drinks. The amount of calories, carbs, fats, and other nutrients listed on the label is based on one serving of the item. Many items contain more than one serving per package.  Check the total grams (g) of carbs in one serving. You can calculate the number of servings of carbs in one serving by dividing the total carbs by 15. For example, if a food has 30 g of total  carbs, it would be equal to 2 servings of carbs.  Check the number of grams (g) of saturated and trans fats in one serving. Choose foods that have low or no amount of these fats.  Check the number of milligrams (mg) of salt (sodium) in one serving. Most people should limit total sodium intake to less than 2,300 mg per day.  Always check the nutrition information of foods labeled as "low-fat" or "nonfat". These foods may be higher in added sugar or refined carbs and should be avoided.  Talk to your dietitian to identify your daily goals for nutrients listed on the label. Shopping  Avoid buying canned, premade, or processed foods. These foods tend to be high in fat, sodium, and added sugar.  Shop around the outside edge of the grocery store. This includes fresh fruits and vegetables, bulk grains, fresh meats, and fresh dairy. Cooking  Use low-heat cooking methods, such as baking, instead of high-heat cooking methods like deep frying.  Cook using healthy oils, such as olive, canola, or sunflower oil.  Avoid cooking with butter, cream, or high-fat meats. Meal planning  Eat meals and snacks regularly, preferably at the same times every day. Avoid going long periods of time without eating.  Eat foods high in fiber, such as fresh fruits, vegetables, beans, and whole grains. Talk to your dietitian about how many servings of carbs you can eat at each meal.  Eat 4-6 ounces (oz) of lean protein each day, such as lean meat, chicken, fish, eggs, or tofu. One oz of lean protein is equal to: ? 1 oz of meat, chicken, or fish. ? 1 egg. ?  cup of tofu.  Eat some foods each day that contain healthy fats, such as avocado, nuts, seeds, and fish. Lifestyle  Check your blood glucose regularly.  Exercise regularly as told by your health care provider. This may include: ? 150 minutes of moderate-intensity or vigorous-intensity exercise each week. This could be brisk walking, biking, or water  aerobics. ? Stretching and doing strength exercises, such as yoga or weightlifting, at least 2 times a week.  Take medicines as told by your health care provider.  Do not use any products that contain nicotine or tobacco, such as cigarettes and e-cigarettes. If you need help quitting, ask your health care provider.  Work with a Veterinary surgeon or diabetes educator to identify strategies to manage stress and any emotional and social challenges. Questions to ask a health care provider  Do I need to meet with a diabetes educator?  Do I need to meet with  a dietitian?  What number can I call if I have questions?  When are the best times to check my blood glucose? Where to find more information:  American Diabetes Association: diabetes.org  Academy of Nutrition and Dietetics: www.eatright.[employer] of Diabetes and Digestive and Kidney Diseases (NIH): [employer] Summary  A healthy meal plan will help you control your blood glucose and maintain a healthy lifestyle.  Working with a diet and nutrition specialist (dietitian) can help you make a meal plan that is best for you.  Keep in mind that carbohydrates (carbs) and alcohol have immediate effects on your blood glucose levels. It is important to count carbs and to use alcohol carefully. This information is not intended to replace advice given to you by your health care provider. Make sure you discuss any questions you have with your health care provider. Document Revised: 01/13/2017 Document Reviewed: 03/07/2016 Elsevier Patient Education  2020 [employer].

## 2019-07-11 NOTE — TOC Benefit Eligibility Note (Signed)
Transition of Care [facility]) Benefit Eligibility Note    Patient Details  Name: [name] MRN: [id number] Date of Birth: 16-Jun-1970   Medication/Dose: BRILINTA  90 MG BID  Covered?: Yes  Tier: 3 Drug  Prescription Coverage Preferred Pharmacy: [facility]  Spoke with Person/Company/Phone Number:: ASHLEY B .  @ [employer] # [phone]  Co-Pay: $30.00  Prior Approval: No  Deductible: (NO DEDUCTIBLE WITH PLAN)  Additional Notes: 90 DAY SUPPLY FOR RETIAL $ 90.00   and   90 DAY SUPPLY FOR M/O $ 75.00    Mardene Sayer Phone Number: 07/11/2019, 11:20 AM

## 2019-07-11 NOTE — Discharge Summary (Signed)
Physician Discharge Summary  Shawn Landry GEX:[id number] DOB: [birth year] DOA: 07/09/2019  PCP: Patient, No Pcp Per  Admit date: 07/09/2019 Discharge date: 07/11/2019  Time spent: 45 minutes  Recommendations for Outpatient Follow-up:  Patient will be discharged to home.  Patient will need to follow up with primary care provider within one week of discharge, discuss diabetes management. Follow up with cardiology. Patient should continue medications as prescribed.  Patient should follow a heart healthy/carb modifed diet.   Discharge Diagnoses:  Chest pain/SEMI New onset Diabetes mellitus, type II with hyperglycemia Hyperlipidemia GERD  Discharge Condition: Stable  Diet recommendation: heart healthy/carb modified  Filed Weights   07/09/19 0122  Weight: 77.1 kg    History of present illness:  On 07/09/2019 by Dr. Charlott Holler I Landry a 49 y.o.malewith past medical history of hyperlipidemia, gastroesophageal reflux disease who came to hospital tonight for evaluation of chest pain. Patient stated that he was in usual state of health until last evening,when around of 9 PM,while he was watching TV,he quickly developed left-sided chest pressure,about 10/10,without radiation,described like elephant sitting on the chest. The pain was constant,level was fluctuating,but never went away.Patient reported mild shortness of breath,intermittent nausea and increased anxiety. Patient does not have any previous cardiac history.He is a former smoker,quit about 10 years ago.Denies use of any illicit drugs,specifically cocaine or alcohol.Report history of sudden death,at his brother at age of 35,presumably due to cardiac event.Due to ongoing chest pain, patient decided come to ED for evaluation.  Hospital Course:  Chest pain/SEMI -presented with elevated troponin and ongoing chest pain on admission -Cardiology consulted and appreciated -Status post catheterization,  stenting to the obtuse marginal -Continue metoprolol, aspirin, Brilinta, statin -Echocardiogram EF 01-02%, LV diastolic parameters were normal  New onset Diabetes mellitus, type II with hyperglycemia -Hemoglobin A1c 11.3, no prior for comparison -was placed on ISS and CBG monitoring -Diabetes coordinator consulted and appreciated -Given patient's motivation, feel that oral regimen may be warranted -Nutrition also consulted -Will discharge patient with Jardiance and metformin- low doses, will likely need to have doses increased as patient tolerates -instructed patient to follow up closely with his PCP  Hyperlipidemia -Lipid panel shows total cholesterol 265, HDL 48, LDL 202, triglycerides 74 -Continue statin- should have repeat lipid panel and LFTS in 6 weeks  GERD -Continue PPI  Consultants Cardiology  Procedures  Left heart catheterization coronary angiography Echocardiogram  Discharge Exam: Vitals:   07/11/19 0030 07/11/19 0510  BP: 101/71 101/73  Pulse:    Resp: (!) 25 17  Temp:  97.7 F (36.5 C)  SpO2:       General: Well developed, well nourished, NAD, appears stated age  HEENT: NCAT, mucous membranes moist.  Cardiovascular: S1 S2 auscultated, tachycardic, no murmur  Respiratory: Clear to auscultation bilaterally with equal chest rise  Abdomen: Soft, nontender, nondistended, + bowel sounds  Extremities: warm dry without cyanosis clubbing or edema  Neuro: AAOx3, nonfocal  Psych: Appropriate mood and affect, pleasant  Discharge Instructions Discharge Instructions    Amb Referral to Cardiac Rehabilitation   Complete by: As directed    Diagnosis:  Coronary Stents NSTEMI PTCA     After initial evaluation and assessments completed: Virtual Based Care may be provided alone or in conjunction with Phase 2 Cardiac Rehab based on patient barriers.: Yes   Ambulatory referral to Nutrition and Diabetic Education   Complete by: As directed    New DM2 A1c  11.7   Discharge instructions   Complete by: As directed  Patient will be discharged to home.  Patient will need to follow up with primary care provider within one week of discharge, discuss diabetes management. Follow up with cardiology. Patient should continue medications as prescribed.  Patient should follow a heart healthy/carb modifed diet.     Allergies as of 07/11/2019   No Known Allergies     Medication List    STOP taking these medications   meloxicam 15 MG tablet Commonly known as: MOBIC     TAKE these medications   aspirin EC 81 MG tablet Take 81 mg by mouth once a week.   atorvastatin 80 MG tablet Commonly known as: LIPITOR Take 1 tablet (80 mg total) by mouth daily.   blood glucose meter kit and supplies Dispense based on patient and insurance preference. Use up to four times daily as directed. (FOR ICD-10 E10.9, E11.9).   empagliflozin 10 MG Tabs tablet Commonly known as: Jardiance Take 1 tablet (10 mg total) by mouth daily before breakfast.   metFORMIN 500 MG tablet Commonly known as: Glucophage Take 1 tablet (500 mg total) by mouth 2 (two) times daily with a meal.   Metoprolol Tartrate 75 MG Tabs Take 75 mg by mouth 2 (two) times daily.   nitroGLYCERIN 0.4 MG SL tablet Commonly known as: NITROSTAT Place 1 tablet (0.4 mg total) under the tongue every 5 (five) minutes x 3 doses as needed for chest pain.   ticagrelor 90 MG Tabs tablet Commonly known as: BRILINTA Take 1 tablet (90 mg total) by mouth 2 (two) times daily.      No Known Allergies    The results of significant diagnostics from this hospitalization (including imaging, microbiology, ancillary and laboratory) are listed below for reference.    Significant Diagnostic Studies: CARDIAC CATHETERIZATION  Result Date: 07/09/2019  A stent was successfully placed.   Non-ST elevation myocardial infarction, late presenting, involving the proximal portion of the large obtuse marginal that  supplies the lateral and inferoapical wall.  Successful PCI with stent implantation reducing 100% stenosis with TIMI grade 0 flow to 0% stenosis and TIMI grade III flow.  Patent left main  50% mid LAD disease and moderately severe proximal diagonal 1 and diagonal 2 disease (better treated with medication).  Diffuse luminal irregularities from proximal to distal RCA with proximal to mid 50 % narrowing.  Inferoapical moderate hypokinesis.  EF 45 to 50%.  LVEDP 17 mmHg. RECOMMENDATIONS:  Aspirin and Brilinta x12 months  Aggressive risk factor modification including 50% reduction in LDL with target less than 70.  Aerobic exercise and consideration of cardiac rehab.  Hemoglobin A1c.  Continue to cycle cardiac markers.  NM Myocar Single W/Spect W/Wall Motion And EF  Result Date: 07/09/2019 CLINICAL DATA:  Chest pain.  Acute coronary syndrome suspected. EXAM: MYOCARDIAL IMAGING WITH SPECT (REST) TECHNIQUE: Standard myocardial SPECT imaging was performed after resting intravenous injection of 10.2 Tc-85mtetrofosmin. COMPARISON:  CTA chest of earlier in the day FINDINGS: Perfusion: A large, severe rest defect involves the mid to basilar segment of the lateral and inferolateral walls. IMPRESSION: Large, severe rest defect involving the lateral and inferolateral walls. Considerations include acute ischemia or infarct. Stress images not performed. Electronically Signed   By: KAbigail MiyamotoM.D.   On: 07/09/2019 17:00   DG Chest Portable 1 View  Result Date: 07/09/2019 CLINICAL DATA:  Chest pain EXAM: PORTABLE CHEST 1 VIEW COMPARISON:  Radiograph 01/03/2003 FINDINGS: Streaky opacities in the bases likely reflect atelectasis with low volumes and central vascular crowding.  Some more coarse reticular opacities are more indeterminate but could reflect chronic interstitial change versus less likely acute interstitial inflammation. No pneumothorax or effusion. The cardiomediastinal contours are unremarkable. No acute  osseous or soft tissue abnormality. Degenerative changes are present in the imaged spine and shoulders. Telemetry leads overlie the chest. IMPRESSION: 1. Streaky opacities in the bases likely reflect atelectasis with low volumes and central vascular crowding. 2. Coarse reticular opacities are indeterminate favor chronic interstitial change. Electronically Signed   By: Lovena Le M.D.   On: 07/09/2019 01:45   ECHOCARDIOGRAM COMPLETE  Result Date: 07/10/2019    ECHOCARDIOGRAM REPORT   Patient Name:   RIP HAWES [facility] Date of Exam: 07/10/2019 Medical Rec #:  [id number]     Height:       65.0 in Accession #:    [id number]    Weight:       170.0 lb Date of Birth:  19-Oct-1970     BSA:          1.846 m Patient Age:    89 years      BP:           118/83 mmHg Patient Gender: M             HR:           106 bpm. Exam Location:  Inpatient Procedure: 2D Echo Indications:    Acute myocardial infarction 410  History:        Patient has no prior history of Echocardiogram examinations.                 Signs/Symptoms:Chest Pain; Risk Factors:Dyslipidemia. Elevated                 troponin.  Sonographer:    Vikki Ports Turrentine Referring Phys: [place]  1. Left ventricular ejection fraction, by estimation, is 50 to 55%. The left ventricle has low normal function. The left ventricle demonstrates regional wall motion abnormalities (see scoring diagram/findings for description). Left ventricular diastolic  parameters were normal.  2. Right ventricular systolic function is normal. The right ventricular size is normal. Tricuspid regurgitation signal is inadequate for assessing PA pressure.  3. The mitral valve is grossly normal. No evidence of mitral valve regurgitation. No evidence of mitral stenosis.  4. The aortic valve is tricuspid. Aortic valve regurgitation is not visualized. No aortic stenosis is present.  5. The inferior vena cava is dilated in size with >50% respiratory variability, suggesting right atrial  pressure of 8 mmHg. FINDINGS  Left Ventricle: Left ventricular ejection fraction, by estimation, is 50 to 55%. The left ventricle has low normal function. The left ventricle demonstrates regional wall motion abnormalities. The left ventricular internal cavity size was normal in size. There is no left ventricular hypertrophy. Left ventricular diastolic parameters were normal.  LV Wall Scoring: The entire lateral wall is hypokinetic. Right Ventricle: The right ventricular size is normal. No increase in right ventricular wall thickness. Right ventricular systolic function is normal. Tricuspid regurgitation signal is inadequate for assessing PA pressure. Left Atrium: Left atrial size was normal in size. Right Atrium: Right atrial size was normal in size. Pericardium: Trivial pericardial effusion is present. Presence of pericardial fat pad. Mitral Valve: The mitral valve is grossly normal. No evidence of mitral valve regurgitation. No evidence of mitral valve stenosis. Tricuspid Valve: The tricuspid valve is grossly normal. Tricuspid valve regurgitation is trivial. No evidence of tricuspid stenosis. Aortic Valve: The aortic valve is tricuspid. Aortic  valve regurgitation is not visualized. No aortic stenosis is present. Pulmonic Valve: The pulmonic valve was grossly normal. Pulmonic valve regurgitation is not visualized. No evidence of pulmonic stenosis. Aorta: The aortic root is normal in size and structure. Venous: The inferior vena cava is dilated in size with greater than 50% respiratory variability, suggesting right atrial pressure of 8 mmHg. IAS/Shunts: The atrial septum is grossly normal.  LEFT VENTRICLE PLAX 2D LVIDd:         3.90 cm  Diastology LVIDs:         2.70 cm  LV e' lateral:   7.07 cm/s LV PW:         0.90 cm  LV E/e' lateral: 13.5 LV IVS:        0.90 cm  LV e' medial:    9.25 cm/s LVOT diam:     1.90 cm  LV E/e' medial:  10.3 LV SV:         46 LV SV Index:   25 LVOT Area:     2.84 cm  RIGHT VENTRICLE RV  S prime:     14.30 cm/s TAPSE (M-mode): 2.0 cm LEFT ATRIUM             Index LA diam:        3.40 cm 1.84 cm/m LA Vol (A2C):   34.3 ml 18.58 ml/m LA Vol (A4C):   25.3 ml 13.70 ml/m LA Biplane Vol: 30.0 ml 16.25 ml/m  AORTIC VALVE LVOT Vmax:   103.00 cm/s LVOT Vmean:  71.800 cm/s LVOT VTI:    0.164 m  AORTA Ao Root diam: 3.10 cm MITRAL VALVE MV Area (PHT): 7.37 cm    SHUNTS MV Decel Time: 103 msec    Systemic VTI:  0.16 m MV E velocity: 95.10 cm/s  Systemic Diam: 1.90 cm MV A velocity: 66.00 cm/s MV E/A ratio:  1.44 Eleonore Chiquito MD Electronically signed by Eleonore Chiquito MD Signature Date/Time: 07/10/2019/12:55:41 PM    Final    CT Angio Chest/Abd/Pel for Dissection W and/or Wo Contrast  Result Date: 07/09/2019 CLINICAL DATA:  Chest pain radiating to left arm with nausea. Aortic dissection suspected. EXAM: CT ANGIOGRAPHY CHEST, ABDOMEN AND PELVIS TECHNIQUE: Non-contrast CT of the chest was initially obtained. Multidetector CT imaging through the chest, abdomen and pelvis was performed using the standard protocol during bolus administration of intravenous contrast. Multiplanar reconstructed images and MIPs were obtained and reviewed to evaluate the vascular anatomy. CONTRAST:  125m OMNIPAQUE IOHEXOL 350 MG/ML SOLN COMPARISON:  Radiograph earlier today. FINDINGS: CTA CHEST FINDINGS Cardiovascular: Thoracic aorta is normal in caliber. No dissection, aortic hematoma, or evidence of acute aortic syndrome. No significant atherosclerosis. Conventional branching pattern from the aortic arch. Heart is normal in size without pericardial effusion. No filling defects in the central pulmonary arteries to the segmental level. Mediastinum/Nodes: Diffuse esophageal wall thickening extending from the carinal level to the gastroesophageal junction. Small mediastinal lymph nodes not enlarged by size criteria. No hilar adenopathy. No suspicious thyroid nodule. Lungs/Pleura: Clear lungs. No consolidation. No pleural fluid. No  pulmonary edema. Trachea and mainstem bronchi are patent. Musculoskeletal: Mild degenerative change in the spine with Schmorl's nodes. There are no acute or suspicious osseous abnormalities. Review of the MIP images confirms the above findings. CTA ABDOMEN AND PELVIS FINDINGS VASCULAR Aorta: Normal caliber aorta without aneurysm, dissection, vasculitis or significant stenosis. Celiac: Patent without evidence of aneurysm, dissection, vasculitis or significant stenosis. SMA: Patent without evidence of aneurysm, dissection, vasculitis or significant stenosis. Renals:  Both renal arteries are patent without evidence of aneurysm, dissection, vasculitis, fibromuscular dysplasia or significant stenosis. IMA: Patent without evidence of aneurysm, dissection, vasculitis or significant stenosis. Inflow: Predominantly noncalcified plaque involving the common iliac arteries, right greater than left. There is approximately 50% luminal narrowing of the right common iliac artery. No dissection or acute findings. Veins: No obvious venous abnormality within the limitations of this arterial phase study. Review of the MIP images confirms the above findings. NON-VASCULAR Hepatobiliary: No focal hepatic lesion on arterial phase exam. Possible noncalcified gallstones. No pericholecystic inflammation or biliary dilatation. Pancreas: No ductal dilatation or inflammation. Spleen: Normal in size without focal abnormality. Adrenals/Urinary Tract: Normal adrenal glands. No hydronephrosis or perinephric edema. No visualized renal calculi. Distended urinary bladder without wall thickening. Stomach/Bowel: Ingested material within the stomach. No evidence of gastric wall thickening. No small bowel obstruction or inflammatory change. There is fecalization of small bowel contents. Normal appendix. Moderate volume of stool throughout the colon. The sigmoid colon is redundant. No colonic wall thickening. Lymphatic: No adenopathy. Reproductive: Prominent  prostate gland spans 5 cm transverse. Other: Fat within both inguinal canals. No free air, free fluid, or intra-abdominal fluid collection. Musculoskeletal: There are no acute or suspicious osseous abnormalities. Review of the MIP images confirms the above findings. IMPRESSION: 1. No aortic dissection or acute aortic abnormality. 2. Diffuse esophageal wall thickening extending from the mid esophagus to the gastroesophageal junction. Findings may represent reflux or esophagitis. Length of involvement favors against neoplastic process. 3. Constipation bowel gas pattern with fecalization of small bowel contents, suggesting slow transit. 4. Distended urinary bladder without wall thickening. 5. Possible noncalcified gallstones. No gallbladder inflammation or biliary dilatation. Electronically Signed   By: Keith Rake M.D.   On: 07/09/2019 03:15    Microbiology: Recent Results (from the past 240 hour(s))  SARS Coronavirus 2 by RT PCR (hospital order, performed in [facility] hospital lab) Nasopharyngeal Nasopharyngeal Swab     Status: None   Collection Time: 07/09/19  4:43 AM   Specimen: Nasopharyngeal Swab  Result Value Ref Range Status   SARS Coronavirus 2 NEGATIVE NEGATIVE Final    Comment: (NOTE) SARS-CoV-2 target nucleic acids are NOT DETECTED. The SARS-CoV-2 RNA is generally detectable in upper and lower respiratory specimens during the acute phase of infection. The lowest concentration of SARS-CoV-2 viral copies this assay can detect is 250 copies / mL. A negative result does not preclude SARS-CoV-2 infection and should not be used as the sole basis for treatment or other patient management decisions.  A negative result may occur with improper specimen collection / handling, submission of specimen other than nasopharyngeal swab, presence of viral mutation(s) within the areas targeted by this assay, and inadequate number of viral copies (<250 copies / mL). A negative result must be combined  with clinical observations, patient history, and epidemiological information. Fact Sheet for Patients:   StrictlyIdeas.no Fact Sheet for Healthcare Providers: BankingDealers.co.za This test is not yet approved or cleared  by the [place] FDA and has been authorized for detection and/or diagnosis of SARS-CoV-2 by FDA under an Emergency Use Authorization (EUA).  This EUA will remain in effect (meaning this test can be used) for the duration of the COVID-19 declaration under Section 564(b)(1) of the Act, 21 U.S.C. section 360bbb-3(b)(1), unless the authorization is terminated or revoked sooner. Performed at [facility], [place] [address] [place], [place] [address]   MRSA PCR Screening     Status: None   Collection Time: 07/09/19  5:33 PM   Specimen: Nasal Mucosa; Nasopharyngeal  Result Value Ref Range Status   MRSA by PCR NEGATIVE NEGATIVE Final    Comment:        The GeneXpert MRSA Assay (FDA approved for NASAL specimens only), is one component of a comprehensive MRSA colonization surveillance program. It is not intended to diagnose MRSA infection nor to guide or monitor treatment for MRSA infections. Performed at [facility], [place] [address], [place], [place] [address]      Labs: Basic Metabolic Panel: Recent Labs  Lab 07/09/19 0125 07/10/19 0212 07/11/19 0710  NA 136 136 135  K 4.1 4.1 3.7  CL 103 103 103  CO2 20* 21* 23  GLUCOSE 369* 253* 192*  BUN '13 10 9  ' CREATININE 0.76 0.65 0.69  CALCIUM 8.9 8.3* 8.4*   Liver Function Tests: No results for input(s): AST, ALT, ALKPHOS, BILITOT, PROT, ALBUMIN in the last 168 hours. No results for input(s): LIPASE, AMYLASE in the last 168 hours. No results for input(s): AMMONIA in the last 168 hours. CBC: Recent Labs  Lab 07/09/19 0125 07/10/19 0212 07/11/19 0710  WBC 10.1 12.5* 9.7  HGB 13.1 11.2* 11.4*  HCT 42.9 36.7* 37.7*  MCV 74.2* 74.9* 74.5*  PLT  384 320 291   Cardiac Enzymes: No results for input(s): CKTOTAL, CKMB, CKMBINDEX, TROPONINI in the last 168 hours. BNP: BNP (last 3 results) No results for input(s): BNP in the last 8760 hours.  ProBNP (last 3 results) No results for input(s): PROBNP in the last 8760 hours.  CBG: Recent Labs  Lab 07/10/19 0613 07/10/19 1112 07/10/19 1626 07/10/19 2126 07/11/19 0630  GLUCAP 205* 194* 211* 200* 181*       Signed:  Maryann Mikhail  [facility] 07/11/2019, 9:10 AM

## 2019-07-11 NOTE — Progress Notes (Signed)
Subjective:  No pain Long talk about better f/u and new diagnosis DM/CAD/HLD  Objective:  Vitals:   07/10/19 2010 07/11/19 0000 07/11/19 0030 07/11/19 0510  BP: 116/90  101/71 101/73  Pulse:      Resp: 19  (!) 25 17  Temp: 98 F (36.7 C) 97.8 F (36.6 C)  97.7 F (36.5 C)  TempSrc: Oral Oral  Oral  SpO2:      Weight:      Height:        Intake/Output from previous day:  Intake/Output Summary (Last 24 hours) at 07/11/2019 4034 Last data filed at 07/10/2019 1200 Gross per 24 hour  Intake 186 ml  Output --  Net 186 ml    Physical Exam: Affect appropriate Healthy:  appears stated age HEENT: normal Neck supple with no adenopathy JVP normal no bruits no thyromegaly Lungs clear with no wheezing and good diaphragmatic motion Heart:  S1/S2 no murmur, no rub, gallop or click PMI normal Abdomen: benighn, BS positve, no tenderness, no AAA no bruit.  No HSM or HJR Distal pulses intact with no bruits No edema Neuro non-focal Skin warm and dry No muscular weakness Right radial A no hematoma   Lab Results: Basic Metabolic Panel: Recent Labs    07/10/19 0212 07/11/19 0710  NA 136 135  K 4.1 3.7  CL 103 103  CO2 21* 23  GLUCOSE 253* 192*  BUN 10 9  CREATININE 0.65 0.69  CALCIUM 8.3* 8.4*   Liver Function Tests: No results for input(s): AST, ALT, ALKPHOS, BILITOT, PROT, ALBUMIN in the last 72 hours. No results for input(s): LIPASE, AMYLASE in the last 72 hours. CBC: Recent Labs    07/10/19 0212 07/11/19 0710  WBC 12.5* 9.7  HGB 11.2* 11.4*  HCT 36.7* 37.7*  MCV 74.9* 74.5*  PLT 320 291   Hemoglobin A1C: Recent Labs    07/09/19 0520  HGBA1C 11.3*   Fasting Lipid Panel: Recent Labs    07/09/19 0520  CHOL 265*  HDL 48  LDLCALC 202*  TRIG 74  CHOLHDL 5.5    Imaging: CARDIAC CATHETERIZATION  Result Date: 07/09/2019  A stent was successfully placed.   Non-ST elevation myocardial infarction, late presenting, involving the proximal portion of  the large obtuse marginal that supplies the lateral and inferoapical wall.  Successful PCI with stent implantation reducing 100% stenosis with TIMI grade 0 flow to 0% stenosis and TIMI grade III flow.  Patent left main  50% mid LAD disease and moderately severe proximal diagonal 1 and diagonal 2 disease (better treated with medication).  Diffuse luminal irregularities from proximal to distal RCA with proximal to mid 50 % narrowing.  Inferoapical moderate hypokinesis.  EF 45 to 50%.  LVEDP 17 mmHg. RECOMMENDATIONS:  Aspirin and Brilinta x12 months  Aggressive risk factor modification including 50% reduction in LDL with target less than 70.  Aerobic exercise and consideration of cardiac rehab.  Hemoglobin A1c.  Continue to cycle cardiac markers.  NM Myocar Single W/Spect W/Wall Motion And EF  Result Date: 07/09/2019 CLINICAL DATA:  Chest pain.  Acute coronary syndrome suspected. EXAM: MYOCARDIAL IMAGING WITH SPECT (REST) TECHNIQUE: Standard myocardial SPECT imaging was performed after resting intravenous injection of 10.2 Tc-56m tetrofosmin. COMPARISON:  CTA chest of earlier in the day FINDINGS: Perfusion: A large, severe rest defect involves the mid to basilar segment of the lateral and inferolateral walls. IMPRESSION: Large, severe rest defect involving the lateral and inferolateral walls. Considerations include acute ischemia or infarct.  Stress images not performed. Electronically Signed   By: Jeronimo Greaves M.D.   On: 07/09/2019 17:00   ECHOCARDIOGRAM COMPLETE  Result Date: 07/10/2019    ECHOCARDIOGRAM REPORT   Patient Name:   Shawn Landry [facility] Date of Exam: 07/10/2019 Medical Rec #:  [id number]     Height:       65.0 in Accession #:    [id number]    Weight:       170.0 lb Date of Birth:  [birth year]     BSA:          1.846 m Patient Age:    48 years      BP:           118/83 mmHg Patient Gender: M             HR:           106 bpm. Exam Location:  Inpatient Procedure: 2D Echo Indications:    Acute  myocardial infarction 410  History:        Patient has no prior history of Echocardiogram examinations.                 Signs/Symptoms:Chest Pain; Risk Factors:Dyslipidemia. Elevated                 troponin.  Sonographer:    Leeroy Bock Turrentine Referring Phys: [phone] Barry Dienes SMITH IMPRESSIONS  1. Left ventricular ejection fraction, by estimation, is 50 to 55%. The left ventricle has low normal function. The left ventricle demonstrates regional wall motion abnormalities (see scoring diagram/findings for description). Left ventricular diastolic  parameters were normal.  2. Right ventricular systolic function is normal. The right ventricular size is normal. Tricuspid regurgitation signal is inadequate for assessing PA pressure.  3. The mitral valve is grossly normal. No evidence of mitral valve regurgitation. No evidence of mitral stenosis.  4. The aortic valve is tricuspid. Aortic valve regurgitation is not visualized. No aortic stenosis is present.  5. The inferior vena cava is dilated in size with >50% respiratory variability, suggesting right atrial pressure of 8 mmHg. FINDINGS  Left Ventricle: Left ventricular ejection fraction, by estimation, is 50 to 55%. The left ventricle has low normal function. The left ventricle demonstrates regional wall motion abnormalities. The left ventricular internal cavity size was normal in size. There is no left ventricular hypertrophy. Left ventricular diastolic parameters were normal.  LV Wall Scoring: The entire lateral wall is hypokinetic. Right Ventricle: The right ventricular size is normal. No increase in right ventricular wall thickness. Right ventricular systolic function is normal. Tricuspid regurgitation signal is inadequate for assessing PA pressure. Left Atrium: Left atrial size was normal in size. Right Atrium: Right atrial size was normal in size. Pericardium: Trivial pericardial effusion is present. Presence of pericardial fat pad. Mitral Valve: The mitral valve is  grossly normal. No evidence of mitral valve regurgitation. No evidence of mitral valve stenosis. Tricuspid Valve: The tricuspid valve is grossly normal. Tricuspid valve regurgitation is trivial. No evidence of tricuspid stenosis. Aortic Valve: The aortic valve is tricuspid. Aortic valve regurgitation is not visualized. No aortic stenosis is present. Pulmonic Valve: The pulmonic valve was grossly normal. Pulmonic valve regurgitation is not visualized. No evidence of pulmonic stenosis. Aorta: The aortic root is normal in size and structure. Venous: The inferior vena cava is dilated in size with greater than 50% respiratory variability, suggesting right atrial pressure of 8 mmHg. IAS/Shunts: The atrial septum is grossly normal.  LEFT VENTRICLE PLAX 2D LVIDd:  3.90 cm  Diastology LVIDs:         2.70 cm  LV e' lateral:   7.07 cm/s LV PW:         0.90 cm  LV E/e' lateral: 13.5 LV IVS:        0.90 cm  LV e' medial:    9.25 cm/s LVOT diam:     1.90 cm  LV E/e' medial:  10.3 LV SV:         46 LV SV Index:   25 LVOT Area:     2.84 cm  RIGHT VENTRICLE RV S prime:     14.30 cm/s TAPSE (M-mode): 2.0 cm LEFT ATRIUM             Index LA diam:        3.40 cm 1.84 cm/m LA Vol (A2C):   34.3 ml 18.58 ml/m LA Vol (A4C):   25.3 ml 13.70 ml/m LA Biplane Vol: 30.0 ml 16.25 ml/m  AORTIC VALVE LVOT Vmax:   103.00 cm/s LVOT Vmean:  71.800 cm/s LVOT VTI:    0.164 m  AORTA Ao Root diam: 3.10 cm MITRAL VALVE MV Area (PHT): 7.37 cm    SHUNTS MV Decel Time: 103 msec    Systemic VTI:  0.16 m MV E velocity: 95.10 cm/s  Systemic Diam: 1.90 cm MV A velocity: 66.00 cm/s MV E/A ratio:  1.44 Lennie Odor MD Electronically signed by Lennie Odor MD Signature Date/Time: 07/10/2019/12:55:41 PM    Final     Cardiac Studies:  ECG: SR mild J point elevation in V56   Telemetry:  NSR no arrhythmia  Echo:   Medications:   . aspirin  81 mg Oral Daily  . atorvastatin  80 mg Oral Daily  . heparin  5,000 Units Subcutaneous Q8H  . insulin  aspart  0-9 Units Subcutaneous TID WC  . metoprolol tartrate  75 mg Oral BID  . pantoprazole (PROTONIX) IV  40 mg Intravenous Q12H  . sodium chloride flush  3 mL Intravenous Q12H  . sucralfate  1 g Oral Q6H  . ticagrelor  90 mg Oral BID     . sodium chloride      Assessment/Plan:   1. SEMI:  Total OM post stenting with nice result. Small lateral infarct On high dose beta blocker to mitigate chance Of rupture DAT for a year d/c home today   2. HLD:  LDL 202 - high dose lipitor 80 mg daily   3. GI on sucralfate and protonix   4. DM:  A1c over 11 ? Jardiance/Glucophage  has not seen [facility] in long time needs close f/u with this !!  F/U with Dr Katrinka Blazing who did his intervention or me TOC 4-6 weeks   Charlton Haws 07/11/2019, 8:35 AM

## 2019-07-12 ENCOUNTER — Telehealth (HOSPITAL_COMMUNITY): Payer: Self-pay

## 2019-07-12 ENCOUNTER — Telehealth: Payer: Self-pay | Admitting: Interventional Cardiology

## 2019-07-12 NOTE — Telephone Encounter (Signed)
Pt insurance is active and benefits verified through [employer] Co-pay 0, DED $1,000/0 met, out of pocket $3,500/0 met, co-insurance 20%. no pre-authorization required. Passport, 07/12/2019_0 :39pm, REF# [id number]  Will contact patient to see if he is interested in the Cardiac Rehab Program. If interested, patient will need to complete follow up appt. Once completed, patient will be contacted for scheduling upon review by the RN Navigator.

## 2019-07-12 NOTE — Telephone Encounter (Signed)
Patient contacted regarding discharge from [facility] on 07/11/2019.  Patient understands to follow up with provider Georgie Chard, NP on 07/19/2019 at 2:15 at [address] in [place], [place] [address]. Patient understands discharge instructions? Yes Patient understands medications and regiment? Yes Patient understands to bring all medications to this visit? Yes  Ask patient:  Are you enrolled in My Chart: The pt states that he has not signed up but plans to today. I offered asst but he dclined stating that he feels he can do it without any problems.

## 2019-07-12 NOTE — Telephone Encounter (Signed)
Patient called to confirm [facility] hospital follow up appointment scheduled with Georgie Chard on 07/19/19.

## 2019-07-18 DIAGNOSIS — E1169 Type 2 diabetes mellitus with other specified complication: Secondary | ICD-10-CM | POA: Diagnosis not present

## 2019-07-18 DIAGNOSIS — E7800 Pure hypercholesterolemia, unspecified: Secondary | ICD-10-CM | POA: Diagnosis not present

## 2019-07-18 DIAGNOSIS — I2510 Atherosclerotic heart disease of native coronary artery without angina pectoris: Secondary | ICD-10-CM | POA: Diagnosis not present

## 2019-07-18 DIAGNOSIS — I252 Old myocardial infarction: Secondary | ICD-10-CM | POA: Diagnosis not present

## 2019-07-19 ENCOUNTER — Ambulatory Visit: Payer: BC Managed Care – PPO | Admitting: Cardiology

## 2019-07-19 ENCOUNTER — Other Ambulatory Visit: Payer: Self-pay

## 2019-07-19 ENCOUNTER — Encounter: Payer: Self-pay | Admitting: Cardiology

## 2019-07-19 VITALS — BP 112/64 | HR 89 | Ht 65.0 in | Wt 161.0 lb

## 2019-07-19 DIAGNOSIS — E1159 Type 2 diabetes mellitus with other circulatory complications: Secondary | ICD-10-CM

## 2019-07-19 DIAGNOSIS — Z9861 Coronary angioplasty status: Secondary | ICD-10-CM

## 2019-07-19 DIAGNOSIS — E782 Mixed hyperlipidemia: Secondary | ICD-10-CM | POA: Diagnosis not present

## 2019-07-19 DIAGNOSIS — I2510 Atherosclerotic heart disease of native coronary artery without angina pectoris: Secondary | ICD-10-CM | POA: Diagnosis not present

## 2019-07-19 NOTE — Patient Instructions (Addendum)
Medication Instructions:   Your physician has recommended you make the following change in your medication:   1) Change Aspirin to 81 mg, 1 tablet by mouth once a day  *If you need a refill on your cardiac medications before your next appointment, please call your pharmacy*  Lab Work:  Your physician recommends that you return for lab work in 6 weeks  If you have labs (blood work) drawn today and your tests are completely normal, you will receive your results only by: Marland Kitchen MyChart Message (if you have MyChart) OR . A paper copy in the mail If you have any lab test that is abnormal or we need to change your treatment, we will call you to review the results.  Testing/Procedures:  None ordered today  Follow-Up:  On 09/13/19 at 9:30AM with Charlton Haws, MD

## 2019-07-26 ENCOUNTER — Other Ambulatory Visit: Payer: Self-pay | Admitting: *Deleted

## 2019-07-26 MED ORDER — METOPROLOL TARTRATE 50 MG PO TABS
75.0000 mg | ORAL_TABLET | Freq: Two times a day (BID) | ORAL | 1 refills | Status: DC
Start: 2019-07-26 — End: 2019-10-22

## 2019-07-30 ENCOUNTER — Encounter: Payer: BC Managed Care – PPO | Attending: Family Medicine | Admitting: Dietician

## 2019-07-30 ENCOUNTER — Other Ambulatory Visit: Payer: Self-pay

## 2019-07-30 DIAGNOSIS — E119 Type 2 diabetes mellitus without complications: Secondary | ICD-10-CM | POA: Diagnosis not present

## 2019-08-02 ENCOUNTER — Encounter: Payer: Self-pay | Admitting: Dietician

## 2019-08-02 NOTE — Progress Notes (Signed)
Patient was seen on 07/30/2019 for the first of a series of three diabetes self-management courses at the [facility].  Patient Education Plan per assessed needs and concerns is to attend three course education program for Diabetes Self Management Education.  The following learning objectives were met by the patient during this class:  Describe diabetes, types of diabetes and pathophysiology  State some common risk factors for diabetes  Defines the role of glucose and insulin  Describe the relationship between diabetes and cardiovascular and other risks  State the members of the Healthcare Team  States the rationale for glucose monitoring and when to test  State their individual Target Range  State the importance of logging glucose readings and how to interpret the readings  Identifies A1C target  Explain the correlation between A1c and eAG values  State symptoms and treatment of high blood glucose and low blood glucose  Explain proper technique for glucose testing and identify proper sharps disposal  Handouts given during class include:  How to Thrive:  A Guide for Your Journey with Diabetes by the ADA  Meal Plan Card and carbohydrate content list  Dietary intake form  Low Sodium Flavoring Tips  Types of Fats  Dining Out  Label reading  Snack list  Planning a balanced meal  The diabetes portion plate  Diabetes Resources  A1c to eAG Conversion Chart  Blood Glucose Log  Diabetes Recommended Care Schedule  Support Group  Diabetes Success Plan  Core Class Satisfaction Survey   Follow-Up Plan:  Attend core 2   

## 2019-08-06 ENCOUNTER — Encounter: Payer: BC Managed Care – PPO | Admitting: Dietician

## 2019-08-06 ENCOUNTER — Other Ambulatory Visit: Payer: Self-pay

## 2019-08-06 ENCOUNTER — Encounter: Payer: Self-pay | Admitting: Dietician

## 2019-08-06 DIAGNOSIS — E119 Type 2 diabetes mellitus without complications: Secondary | ICD-10-CM

## 2019-08-06 NOTE — Progress Notes (Signed)
Patient was seen on 08/06/2019 for the second of a series of three diabetes self-management courses at the [facility]. The following learning objectives were met by the patient during this class:   Describe the role of different macronutrients on glucose  Explain how carbohydrates affect blood glucose  State what foods contain the most carbohydrates  Demonstrate carbohydrate counting  Demonstrate how to read Nutrition Facts food label  Describe effects of various fats on heart health  Describe the importance of good nutrition for health and healthy eating strategies  Describe techniques for managing your shopping, cooking and meal planning  List strategies to follow meal plan when dining out  Describe the effects of alcohol on glucose and how to use it safely  Goals:  Follow Diabetes Meal Plan as instructed  Aim to spread carbs evenly throughout the day  Aim for 3 meals per day and snacks as needed Include lean protein foods to meals/snacks  Monitor glucose levels as instructed by your doctor   Follow-Up Plan:  Attend Core 3  Work towards following your personal food plan.   

## 2019-08-07 ENCOUNTER — Other Ambulatory Visit: Payer: Self-pay | Admitting: *Deleted

## 2019-08-07 MED ORDER — ATORVASTATIN CALCIUM 80 MG PO TABS: 80.0000 mg | ORAL_TABLET | Freq: Every day | ORAL | 3 refills | Status: DC

## 2019-08-07 MED ORDER — TICAGRELOR 90 MG PO TABS: 90.0000 mg | ORAL_TABLET | Freq: Two times a day (BID) | ORAL | 3 refills | Status: DC

## 2019-08-13 ENCOUNTER — Other Ambulatory Visit: Payer: Self-pay

## 2019-08-13 ENCOUNTER — Encounter: Payer: Self-pay | Admitting: Dietician

## 2019-08-13 ENCOUNTER — Encounter: Payer: BC Managed Care – PPO | Admitting: Dietician

## 2019-08-13 DIAGNOSIS — E119 Type 2 diabetes mellitus without complications: Secondary | ICD-10-CM | POA: Diagnosis not present

## 2019-08-13 NOTE — Progress Notes (Signed)
Patient was seen on 08/13/2019 for the third of a series of three diabetes self-management courses at the [facility].   Janene Madeira the amount of activity recommended for healthy living . Describe activities suitable for individual needs . Identify ways to regularly incorporate activity into daily life . Identify barriers to activity and ways to over come these barriers  Identify diabetes medications being personally used and their primary action for lowering glucose and possible side effects . Describe role of stress on blood glucose and develop strategies to address psychosocial issues . Identify diabetes complications and ways to prevent them  Explain how to manage diabetes during illness . Evaluate success in meeting personal goal . Establish 2-3 goals that they will plan to diligently work on  Goals:   I will count my carb choices at most meals and snacks  I will be active 30 minutes or more 3 times a week  I will take my diabetes medications as scheduled  I will eat less unhealthy fats  I will test my glucose at least 10 times a day, 7 days a week using the FreeStyle Libre CGM  I will look at patterns in my record book at least 20 days a month  Your patient has identified these potential barriers to change:  Stress  Your patient has identified their diabetes self-care support plan as   [facility] Support Group     Plan:  Attend Support Group as desired

## 2019-08-21 ENCOUNTER — Telehealth (HOSPITAL_COMMUNITY): Payer: Self-pay

## 2019-08-23 NOTE — Telephone Encounter (Signed)
Cardiac Rehab Medication Review by a Pharmacist  Does the patient  feel that his/her medications are working for him/her?  yes  Has the patient been experiencing any side effects to the medications prescribed?  The patient stated that they have been experiencing a small decrease in appetite and occasional nausea that resolves quickly. The patient stated that these side effects are not bothersome but are new since starting the medications and that if they worsen, they will contact their provider immediately.  Does the patient measure his/her own blood pressure or blood glucose at home?  The patient stated that they do not take their blood pressure at home and that they do not think they were every told by a provider that they need to be taking their blood pressure at home. The patient has a Freestyle Libre CGM and does monitor their readings through this device. The patient also has a glucose meter in which they take their blood glucose readings with. The patient stated that their readings have been averaging between 90-100 per their CGM.   Does the patient have any problems obtaining medications due to transportation or finances?   no  Understanding of regimen: good Understanding of indications: good Potential of compliance: good   Pharmacist Intervention: The patient was counseled on the proper use of nitroglycerin, how to monitor for medication side effects and when to call 911 or contact their provider regarding medication side effects. The patient voice understanding and repeated information back using the teach back method.  Sanda Klein, PharmD, RPh  PGY-1 Pharmacy Resident Office: [phone] 08/23/2019 8:50 AM  Please check [email or web address] for unit-specific pharmacy phone numbers.

## 2019-08-27 ENCOUNTER — Encounter (HOSPITAL_COMMUNITY)
Admission: RE | Admit: 2019-08-27 | Discharge: 2019-08-27 | Disposition: A | Discharge status: Home / Self Care | Payer: BC Managed Care – PPO | Source: Ambulatory Visit | Attending: Cardiovascular Disease | Admitting: Cardiovascular Disease

## 2019-08-27 ENCOUNTER — Encounter (HOSPITAL_COMMUNITY): Payer: Self-pay

## 2019-08-27 ENCOUNTER — Other Ambulatory Visit: Payer: Self-pay

## 2019-08-27 ENCOUNTER — Telehealth: Payer: Self-pay

## 2019-08-27 VITALS — BP 98/62 | HR 89 | Resp 18 | Ht 66.25 in | Wt 160.1 lb

## 2019-08-27 DIAGNOSIS — Z955 Presence of coronary angioplasty implant and graft: Secondary | ICD-10-CM | POA: Insufficient documentation

## 2019-08-27 DIAGNOSIS — I214 Non-ST elevation (NSTEMI) myocardial infarction: Secondary | ICD-10-CM | POA: Insufficient documentation

## 2019-08-27 NOTE — Progress Notes (Signed)
Cardiac Individual Treatment Plan  Patient Details  Name: Shawn Landry MRN: [id number] Date of Birth: 02-Feb-1971 Referring Provider:     CARDIAC REHAB PHASE II ORIENTATION from 08/27/2019 in [place]  Referring Provider Josue Hector, MD      Initial Encounter Date:    CARDIAC REHAB PHASE II ORIENTATION from 08/27/2019 in [place]  Date 08/27/19      Visit Diagnosis: NSTEMI (non-ST elevated myocardial infarction) [facility])  Status post coronary artery stent placement  Patient's Home Medications on Admission:  Current Outpatient Medications:  .  aspirin EC 81 MG tablet, Take 81 mg by mouth daily., Disp: , Rfl:  .  atorvastatin (LIPITOR) 80 MG tablet, Take 1 tablet (80 mg total) by mouth daily., Disp: 90 tablet, Rfl: 3 .  blood glucose meter kit and supplies, Dispense based on patient and insurance preference. Use up to four times daily as directed. (FOR ICD-10 E10.9, E11.9)., Disp: 1 each, Rfl: 0 .  Continuous Blood Gluc Sensor (FREESTYLE LIBRE 2 SENSOR) MISC, Use every 14 days, Disp: 2 each, Rfl: 1 .  empagliflozin (JARDIANCE) 10 MG TABS tablet, Take 1 tablet (10 mg total) by mouth daily before breakfast., Disp: 30 tablet, Rfl: 3 .  metFORMIN (GLUCOPHAGE) 500 MG tablet, Take 1 tablet (500 mg total) by mouth 2 (two) times daily with a meal., Disp: 60 tablet, Rfl: 1 .  metoprolol tartrate (LOPRESSOR) 50 MG tablet, Take 1.5 tablets (75 mg total) by mouth 2 (two) times daily., Disp: 270 tablet, Rfl: 1 .  nitroGLYCERIN (NITROSTAT) 0.4 MG SL tablet, Place 1 tablet (0.4 mg total) under the tongue every 5 (five) minutes x 3 doses as needed for chest pain., Disp: 30 tablet, Rfl: 0 .  pantoprazole (PROTONIX) 40 MG tablet, Take 1 tablet (40 mg total) by mouth daily., Disp: 30 tablet, Rfl: 1 .  ticagrelor (BRILINTA) 90 MG TABS tablet, Take 1 tablet (90 mg total) by mouth 2 (two) times daily., Disp: 180 tablet, Rfl: 3  Past Medical  History: Past Medical History:  Diagnosis Date  . Coronary artery disease   . Diabetes mellitus without complication ([place])   . GERD (gastroesophageal reflux disease)   . Hyperlipidemia     Tobacco Use: Social History   Tobacco Use  Smoking Status Former Smoker  . Quit date: 2010  . Years since quitting: 11.5  Smokeless Tobacco Never Used    Labs: Recent Review Scientist, physiological    Labs for ITP Cardiac and Pulmonary Rehab Latest Ref Rng & Units 07/09/2019   Cholestrol 0 - 200 mg/dL 265(H)   LDLCALC 0 - 99 mg/dL 202(H)   HDL >40 mg/dL 48   Trlycerides <150 mg/dL 74   Hemoglobin A1c 4.8 - 5.6 % 11.3(H)      Capillary Blood Glucose: Lab Results  Component Value Date   GLUCAP 181 (H) 07/11/2019   GLUCAP 200 (H) 07/10/2019   GLUCAP 211 (H) 07/10/2019   GLUCAP 194 (H) 07/10/2019   GLUCAP 205 (H) 07/10/2019     Exercise Target Goals: Exercise Program Goal: Individual exercise prescription set using results from initial 6 min walk test and THRR while considering  patient's activity barriers and safety.   Exercise Prescription Goal: Initial exercise prescription builds to 30-45 minutes a day of aerobic activity, 2-3 days per week.  Home exercise guidelines will be given to patient during program as part of exercise prescription that the participant will acknowledge.  Activity Barriers &  Risk Stratification:  Activity Barriers & Cardiac Risk Stratification - 08/27/19 1100      Activity Barriers & Cardiac Risk Stratification   Activity Barriers Shortness of Breath    Cardiac Risk Stratification Moderate           6 Minute Walk:  6 Minute Walk    Row Name 08/27/19 0953         6 Minute Walk   Phase Initial     Distance 1005 feet     Walk Time 6 minutes  Stopped at 4 mins 49 sec due to SOB.     # of Rest Breaks 0     MPH 1.9     METS 3.34     RPE 13     Perceived Dyspnea  1.5     VO2 Peak 11.71     Symptoms Yes (comment)     Comments Patient c/o mild SOB with  walking. Patient unable to continue at 4 minutes 49 seconds secondary to SOB.     Resting HR 89 bpm     Resting BP 98/62     Resting Oxygen Saturation  99 %     Exercise Oxygen Saturation  during 6 min walk 99 %     Max Ex. HR 96 bpm     Max Ex. BP 105/71     2 Minute Post BP 104/71            Oxygen Initial Assessment:   Oxygen Re-Evaluation:   Oxygen Discharge (Final Oxygen Re-Evaluation):   Initial Exercise Prescription:  Initial Exercise Prescription - 08/27/19 1000      Date of Initial Exercise RX and Referring Provider   Date 08/27/19    Referring Provider Josue Hector, MD    Expected Discharge Date 10/25/19      Treadmill   MPH --    Grade --    Minutes --    METs --      Recumbant Bike   Level 2    Minutes 15    METs 2.3      NuStep   Level 2    SPM 85    Minutes 15    METs 2.3      Prescription Details   Frequency (times per week) 3    Duration Progress to 30 minutes of continuous aerobic without signs/symptoms of physical distress      Intensity   THRR 40-80% of Max Heartrate 68-136    Ratings of Perceived Exertion 11-13    Perceived Dyspnea 0-4      Progression   Progression Continue to progress workloads to maintain intensity without signs/symptoms of physical distress.      Resistance Training   Training Prescription Yes    Weight 4lbs    Reps 10-15           Perform Capillary Blood Glucose checks as needed.  Exercise Prescription Changes:   Exercise Comments:   Exercise Goals and Review:   Exercise Goals    Row Name 08/27/19 0937             Exercise Goals   Increase Physical Activity Yes       Intervention Provide advice, education, support and counseling about physical activity/exercise needs.;Develop an individualized exercise prescription for aerobic and resistive training based on initial evaluation findings, risk stratification, comorbidities and participant's personal goals.       Expected Outcomes Short  Term: Attend rehab on a regular basis to increase  amount of physical activity.;Long Term: Exercising regularly at least 3-5 days a week.;Long Term: Add in home exercise to make exercise part of routine and to increase amount of physical activity.       Increase Strength and Stamina Yes       Intervention Provide advice, education, support and counseling about physical activity/exercise needs.;Develop an individualized exercise prescription for aerobic and resistive training based on initial evaluation findings, risk stratification, comorbidities and participant's personal goals.       Expected Outcomes Short Term: Increase workloads from initial exercise prescription for resistance, speed, and METs.;Short Term: Perform resistance training exercises routinely during rehab and add in resistance training at home;Long Term: Improve cardiorespiratory fitness, muscular endurance and strength as measured by increased METs and functional capacity (6MWT)       Able to understand and use rate of perceived exertion (RPE) scale Yes       Intervention Provide education and explanation on how to use RPE scale       Expected Outcomes Short Term: Able to use RPE daily in rehab to express subjective intensity level;Long Term:  Able to use RPE to guide intensity level when exercising independently       Knowledge and understanding of Target Heart Rate Range (THRR) Yes       Intervention Provide education and explanation of THRR including how the numbers were predicted and where they are located for reference       Expected Outcomes Short Term: Able to state/look up THRR;Long Term: Able to use THRR to govern intensity when exercising independently;Short Term: Able to use daily as guideline for intensity in rehab       Able to check pulse independently Yes       Intervention Provide education and demonstration on how to check pulse in carotid and radial arteries.;Review the importance of being able to check your own pulse  for safety during independent exercise       Expected Outcomes Short Term: Able to explain why pulse checking is important during independent exercise;Long Term: Able to check pulse independently and accurately       Understanding of Exercise Prescription Yes       Intervention Provide education, explanation, and written materials on patient's individual exercise prescription       Expected Outcomes Short Term: Able to explain program exercise prescription;Long Term: Able to explain home exercise prescription to exercise independently              Exercise Goals Re-Evaluation :   Discharge Exercise Prescription (Final Exercise Prescription Changes):   Nutrition:  Target Goals: Understanding of nutrition guidelines, daily intake of sodium <1517m, cholesterol <2057m calories 30% from fat and 7% or less from saturated fats, daily to have 5 or more servings of fruits and vegetables.  Biometrics:  Pre Biometrics - 08/27/19 0949      Pre Biometrics   Waist Circumference 36.75 inches    Hip Circumference 37.75 inches    Waist to Hip Ratio 0.97 %    Triceps Skinfold 14 mm    % Body Fat 24.6 %    Grip Strength 47.5 kg    Flexibility 10.5 in    Single Leg Stand 30 seconds            Nutrition Therapy Plan and Nutrition Goals:   Nutrition Assessments:   Nutrition Goals Re-Evaluation:   Nutrition Goals Re-Evaluation:   Nutrition Goals Discharge (Final Nutrition Goals Re-Evaluation):   Psychosocial: Target Goals: Acknowledge presence  or absence of significant depression and/or stress, maximize coping skills, provide positive support system. Participant is able to verbalize types and ability to use techniques and skills needed for reducing stress and depression.  Initial Review & Psychosocial Screening:  Initial Psych Review & Screening - 08/27/19 1016      Initial Review   Current issues with Current Anxiety/Panic      Family Dynamics   Good Support System? Yes     Comments Mr. Freiberger presents to his cardiac rehab orientation appointment with a positive attitude and outlook however he does admit to having significant anxiety about his recent cardiac event and stenting. States he went from "feeling healthy and active" to having an MI and new diagnosis of DM and hyperlipidemia. He is not married but has a girlfriend that he sees occassionally. He does not have children but lots of friends, several close friends that he is able to share his anxieties with. He denies need for counseling but he states he is going to discuss his anxiety with his PCP on Friday 08/29/19.      Barriers   Psychosocial barriers to participate in program The patient should benefit from training in stress management and relaxation.;Psychosocial barriers identified (see note)      Screening Interventions   Interventions Encouraged to exercise;Provide feedback about the scores to participant;To provide support and resources with identified psychosocial needs    Expected Outcomes Short Term goal: Utilizing psychosocial counselor, staff and physician to assist with identification of specific Stressors or current issues interfering with healing process. Setting desired goal for each stressor or current issue identified.;Long Term Goal: Stressors or current issues are controlled or eliminated.;Short Term goal: Identification and review with participant of any Quality of Life or Depression concerns found by scoring the questionnaire.;Long Term goal: The participant improves quality of Life and PHQ9 Scores as seen by post scores and/or verbalization of changes           Quality of Life Scores:  Quality of Life - 08/27/19 1049      Quality of Life   Select Quality of Life      Quality of Life Scores   Health/Function Pre 21.3 %    Socioeconomic Pre 27.36 %    Psych/Spiritual Pre 28.29 %    Family Pre 27 %    GLOBAL Pre 24.76 %          Scores of 19 and below usually indicate a poorer  quality of life in these areas.  A difference of  2-3 points is a clinically meaningful difference.  A difference of 2-3 points in the total score of the Quality of Life Index has been associated with significant improvement in overall quality of life, self-image, physical symptoms, and general health in studies assessing change in quality of life.  PHQ-9: Recent Review Flowsheet Data    Depression screen [facility] 2/9 08/27/2019 08/02/2019   Decreased Interest 0 0   Down, Depressed, Hopeless 0 0   PHQ - 2 Score 0 0     Interpretation of Total Score  Total Score Depression Severity:  1-4 = Minimal depression, 5-9 = Mild depression, 10-14 = Moderate depression, 15-19 = Moderately severe depression, 20-27 = Severe depression   Psychosocial Evaluation and Intervention:   Psychosocial Re-Evaluation:   Psychosocial Discharge (Final Psychosocial Re-Evaluation):   Vocational Rehabilitation: Provide vocational rehab assistance to qualifying candidates.   Vocational Rehab Evaluation & Intervention:  Vocational Rehab - 08/27/19 0814  Initial Vocational Rehab Evaluation & Intervention   Assessment shows need for Vocational Rehabilitation No      Vocational Rehab Re-Evaulation   Comments patient is currently on short-term disability until completion of cardiac rehab. 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 Participant will state understanding/return demonstration of topics presented.  Learning Barriers/Preferences:   Education Topics: Count Your Pulse:  -Group instruction provided by verbal instruction, demonstration, patient participation and written materials to support subject.  Instructors address importance of being able to find your pulse and how to count your pulse when at home without a heart monitor.  Patients get hands on  experience counting their pulse with staff help and individually.   Heart Attack, Angina, and Risk Factor Modification:  -Group instruction provided by verbal instruction, video, and written materials to support subject.  Instructors address signs and symptoms of angina and heart attacks.    Also discuss risk factors for heart disease and how to make changes to improve heart health risk factors.   Functional Fitness:  -Group instruction provided by verbal instruction, demonstration, patient participation, and written materials to support subject.  Instructors address safety measures for doing things around the house.  Discuss how to get up and down off the floor, how to pick things up properly, how to safely get out of a chair without assistance, and balance training.   Meditation and Mindfulness:  -Group instruction provided by verbal instruction, patient participation, and written materials to support subject.  Instructor addresses importance of mindfulness and meditation practice to help reduce stress and improve awareness.  Instructor also leads participants through a meditation exercise.    Stretching for Flexibility and Mobility:  -Group instruction provided by verbal instruction, patient participation, and written materials to support subject.  Instructors lead participants through series of stretches that are designed to increase flexibility thus improving mobility.  These stretches are additional exercise for major muscle groups that are typically performed during regular warm up and cool down.   Hands Only CPR:  -Group verbal, video, and participation provides a basic overview of AHA guidelines for community CPR. Role-play of emergencies allow participants the opportunity to practice calling for help and chest compression technique with discussion of AED use.   Hypertension: -Group verbal and written instruction that provides a basic overview of hypertension including the most  recent diagnostic guidelines, risk factor reduction with self-care instructions and medication management.    Nutrition I class: Heart Healthy Eating:  -Group instruction provided by PowerPoint slides, verbal discussion, and written materials to support subject matter. The instructor gives an explanation and review of the Therapeutic Lifestyle Changes diet recommendations, which includes a discussion on lipid goals, dietary fat, sodium, fiber, plant stanol/sterol esters, sugar, and the components of a well-balanced, healthy diet.   Nutrition II class: Lifestyle Skills:  -Group instruction provided by PowerPoint slides, verbal discussion, and written materials to support subject matter. The instructor gives an explanation and review of label reading, grocery shopping for heart health, heart healthy recipe modifications, and ways to make healthier choices when eating out.   Diabetes Question & Answer:  -Group instruction provided by PowerPoint slides, verbal discussion, and written materials to support subject matter. The instructor gives an explanation and review of diabetes co-morbidities, pre- and post-prandial blood glucose goals, pre-exercise blood glucose goals, signs, symptoms,  and treatment of hypoglycemia and hyperglycemia, and foot care basics.   Diabetes Blitz:  -Group instruction provided by PowerPoint slides, verbal discussion, and written materials to support subject matter. The instructor gives an explanation and review of the physiology behind type 1 and type 2 diabetes, diabetes medications and rational behind using different medications, pre- and post-prandial blood glucose recommendations and Hemoglobin A1c goals, diabetes diet, and exercise including blood glucose guidelines for exercising safely.    Portion Distortion:  -Group instruction provided by PowerPoint slides, verbal discussion, written materials, and food models to support subject matter. The instructor gives an  explanation of serving size versus portion size, changes in portions sizes over the last 20 years, and what consists of a serving from each food group.   Stress Management:  -Group instruction provided by verbal instruction, video, and written materials to support subject matter.  Instructors review role of stress in heart disease and how to cope with stress positively.     Exercising on Your Own:  -Group instruction provided by verbal instruction, power point, and written materials to support subject.  Instructors discuss benefits of exercise, components of exercise, frequency and intensity of exercise, and end points for exercise.  Also discuss use of nitroglycerin and activating EMS.  Review options of places to exercise outside of rehab.  Review guidelines for sex with heart disease.   Cardiac Drugs I:  -Group instruction provided by verbal instruction and written materials to support subject.  Instructor reviews cardiac drug classes: antiplatelets, anticoagulants, beta blockers, and statins.  Instructor discusses reasons, side effects, and lifestyle considerations for each drug class.   Cardiac Drugs II:  -Group instruction provided by verbal instruction and written materials to support subject.  Instructor reviews cardiac drug classes: angiotensin converting enzyme inhibitors (ACE-I), angiotensin II receptor blockers (ARBs), nitrates, and calcium channel blockers.  Instructor discusses reasons, side effects, and lifestyle considerations for each drug class.   Anatomy and Physiology of the Circulatory System:  Group verbal and written instruction and models provide basic cardiac anatomy and physiology, with the coronary electrical and arterial systems. Review of: AMI, Angina, Valve disease, Heart Failure, Peripheral Artery Disease, Cardiac Arrhythmia, Pacemakers, and the ICD.   Other Education:  -Group or individual verbal, written, or video instructions that support the educational  goals of the cardiac rehab program.   Holiday Eating Survival Tips:  -Group instruction provided by PowerPoint slides, verbal discussion, and written materials to support subject matter. The instructor gives patients tips, tricks, and techniques to help them not only survive but enjoy the holidays despite the onslaught of food that accompanies the holidays.   Knowledge Questionnaire Score:  Knowledge Questionnaire Score - 08/27/19 1048      Knowledge Questionnaire Score   Pre Score 20/24           Core Components/Risk Factors/Patient Goals at Admission:  Personal Goals and Risk Factors at Admission - 08/27/19 1049      Core Components/Risk Factors/Patient Goals on Admission   Diabetes Yes    Intervention Provide education about signs/symptoms and action to take for hypo/hyperglycemia.;Provide education about proper nutrition, including hydration, and aerobic/resistive exercise prescription along with prescribed medications to achieve blood glucose in normal ranges: Fasting glucose 65-99 mg/dL    Expected Outcomes Short Term: Participant verbalizes understanding of the signs/symptoms and immediate care of hyper/hypoglycemia, proper foot care and importance of medication, aerobic/resistive exercise and nutrition plan for blood glucose control.;Long Term: Attainment of HbA1C < 7%.    Hypertension Yes  Intervention Provide education on lifestyle modifcations including regular physical activity/exercise, weight management, moderate sodium restriction and increased consumption of fresh fruit, vegetables, and low fat dairy, alcohol moderation, and smoking cessation.;Monitor prescription use compliance.    Expected Outcomes Short Term: Continued assessment and intervention until BP is < 140/20m HG in hypertensive participants. < 130/8103mHG in hypertensive participants with diabetes, heart failure or chronic kidney disease.;Long Term: Maintenance of blood pressure at goal levels.    Stress Yes     Intervention Offer individual and/or small group education and counseling on adjustment to heart disease, stress management and health-related lifestyle change. Teach and support self-help strategies.;Refer participants experiencing significant psychosocial distress to appropriate mental health specialists for further evaluation and treatment. When possible, include family members and significant others in education/counseling sessions.    Expected Outcomes Short Term: Participant demonstrates changes in health-related behavior, relaxation and other stress management skills, ability to obtain effective social support, and compliance with psychotropic medications if prescribed.;Long Term: Emotional wellbeing is indicated by absence of clinically significant psychosocial distress or social isolation.           Core Components/Risk Factors/Patient Goals Review:    Core Components/Risk Factors/Patient Goals at Discharge (Final Review):    ITP Comments:  ITP Comments    Row Name 08/27/19 0949           ITP Comments Dr. TrFransico HimMedical Director Cardiac Rehab [facility]ac[facility] Pontes            Comments: Patient attended orientation on 08/27/2019 to review rules and guidelines for program.  Completed 6 minute walk test, Intitial ITP, and exercise prescription.  VSS. Patient did complain of shortness of breath 1.5/4 during walk test. States this is symptoms he experiences during exertion at home. Patient also has a moderate amt of anxiety secondary to his cardiac event and stenting.  Telemetry-NSR inv T wave. Strips faxed to Dr. NiJohnsie Cancelith request for advisement PRN  Asymptomatic. Safety measures and social distancing in place per CDC guidelines.

## 2019-08-27 NOTE — Telephone Encounter (Signed)
Nurse from Cardiac Rehab sent EKG readings due to new T wave inversion compared to last EKG in Epic. Patient is symptomatic. Had Dr. Elberta Fortis, DOD, review EKG, he recommends no changes. This is a [name] patient, will leave strips in his box to review.

## 2019-08-28 NOTE — Telephone Encounter (Signed)
This is actually a Shawn Landry pt.  He is scheduled to see him later this month.  [name] just did the cath.

## 2019-08-29 DIAGNOSIS — F419 Anxiety disorder, unspecified: Secondary | ICD-10-CM | POA: Diagnosis not present

## 2019-08-29 DIAGNOSIS — G629 Polyneuropathy, unspecified: Secondary | ICD-10-CM | POA: Diagnosis not present

## 2019-08-29 DIAGNOSIS — D649 Anemia, unspecified: Secondary | ICD-10-CM | POA: Diagnosis not present

## 2019-08-29 DIAGNOSIS — R6889 Other general symptoms and signs: Secondary | ICD-10-CM | POA: Diagnosis not present

## 2019-08-30 ENCOUNTER — Other Ambulatory Visit: Payer: Self-pay

## 2019-08-30 ENCOUNTER — Other Ambulatory Visit: Payer: BC Managed Care – PPO | Admitting: *Deleted

## 2019-08-30 DIAGNOSIS — E782 Mixed hyperlipidemia: Secondary | ICD-10-CM

## 2019-08-30 LAB — LIPID PANEL
Chol/HDL Ratio: 3.3 ratio (ref 0.0–5.0)
Cholesterol, Total: 128 mg/dL (ref 100–199)
HDL: 39 mg/dL — ABNORMAL LOW (ref >39)
LDL Chol Calc (NIH): 66 mg/dL (ref 0–99)
Triglycerides: 127 mg/dL (ref 0–149)
VLDL Cholesterol Cal: 23 mg/dL (ref 5–40)

## 2019-08-30 LAB — HEPATIC FUNCTION PANEL
ALT: 22 IU/L (ref 0–44)
AST: 19 IU/L (ref 0–40)
Albumin: 4.1 g/dL (ref 4.0–5.0)
Alkaline Phosphatase: 139 IU/L — ABNORMAL HIGH (ref 48–121)
Bilirubin Total: 0.4 mg/dL (ref 0.0–1.2)
Bilirubin, Direct: 0.16 mg/dL (ref 0.00–0.40)
Total Protein: 6.8 g/dL (ref 6.0–8.5)

## 2019-09-02 ENCOUNTER — Other Ambulatory Visit: Payer: Self-pay

## 2019-09-02 ENCOUNTER — Encounter (HOSPITAL_COMMUNITY)
Admission: RE | Admit: 2019-09-02 | Discharge: 2019-09-02 | Disposition: A | Discharge status: Home / Self Care | Payer: BC Managed Care – PPO | Source: Ambulatory Visit | Attending: Cardiovascular Disease | Admitting: Cardiovascular Disease

## 2019-09-02 DIAGNOSIS — Z955 Presence of coronary angioplasty implant and graft: Secondary | ICD-10-CM | POA: Diagnosis not present

## 2019-09-02 DIAGNOSIS — I214 Non-ST elevation (NSTEMI) myocardial infarction: Secondary | ICD-10-CM

## 2019-09-02 LAB — GLUCOSE, CAPILLARY
Glucose-Capillary: 165 mg/dL — ABNORMAL HIGH (ref 70–99)
Glucose-Capillary: 150 mg/dL — ABNORMAL HIGH (ref 70–99)

## 2019-09-02 NOTE — Progress Notes (Signed)
Cardiac Individual Treatment Plan  Patient Details  Name: Shawn Landry MRN: [id number] Date of Birth: 10/05/1970 Referring Provider:     CARDIAC REHAB PHASE II ORIENTATION from 08/27/2019 in [place]  Referring Provider Josue Hector, MD      Initial Encounter Date:    CARDIAC REHAB PHASE II ORIENTATION from 08/27/2019 in [place]  Date 08/27/19      Visit Diagnosis: NSTEMI (non-ST elevated myocardial infarction) [facility])  Status post coronary artery stent placement  Patient's Home Medications on Admission:  Current Outpatient Medications:  .  aspirin EC 81 MG tablet, Take 81 mg by mouth daily., Disp: , Rfl:  .  atorvastatin (LIPITOR) 80 MG tablet, Take 1 tablet (80 mg total) by mouth daily., Disp: 90 tablet, Rfl: 3 .  blood glucose meter kit and supplies, Dispense based on patient and insurance preference. Use up to four times daily as directed. (FOR ICD-10 E10.9, E11.9)., Disp: 1 each, Rfl: 0 .  Continuous Blood Gluc Sensor (FREESTYLE LIBRE 2 SENSOR) MISC, Use every 14 days, Disp: 2 each, Rfl: 1 .  empagliflozin (JARDIANCE) 10 MG TABS tablet, Take 1 tablet (10 mg total) by mouth daily before breakfast., Disp: 30 tablet, Rfl: 3 .  metFORMIN (GLUCOPHAGE) 500 MG tablet, Take 1 tablet (500 mg total) by mouth 2 (two) times daily with a meal., Disp: 60 tablet, Rfl: 1 .  metoprolol tartrate (LOPRESSOR) 50 MG tablet, Take 1.5 tablets (75 mg total) by mouth 2 (two) times daily., Disp: 270 tablet, Rfl: 1 .  nitroGLYCERIN (NITROSTAT) 0.4 MG SL tablet, Place 1 tablet (0.4 mg total) under the tongue every 5 (five) minutes x 3 doses as needed for chest pain., Disp: 30 tablet, Rfl: 0 .  pantoprazole (PROTONIX) 40 MG tablet, Take 1 tablet (40 mg total) by mouth daily., Disp: 30 tablet, Rfl: 1 .  ticagrelor (BRILINTA) 90 MG TABS tablet, Take 1 tablet (90 mg total) by mouth 2 (two) times daily., Disp: 180 tablet, Rfl: 3  Past Medical  History: Past Medical History:  Diagnosis Date  . Coronary artery disease   . Diabetes mellitus without complication ([place])   . GERD (gastroesophageal reflux disease)   . Hyperlipidemia     Tobacco Use: Social History   Tobacco Use  Smoking Status Former Smoker  . Quit date: 2010  . Years since quitting: 11.5  Smokeless Tobacco Never Used    Labs: Recent Review Scientist, physiological    Labs for ITP Cardiac and Pulmonary Rehab Latest Ref Rng & Units 07/09/2019 08/30/2019   Cholestrol 100 - 199 mg/dL 265(H) 128   LDLCALC 0 - 99 mg/dL 202(H) 66   HDL >39 mg/dL 48 39(L)   Trlycerides 0 - 149 mg/dL 74 127   Hemoglobin A1c 4.8 - 5.6 % 11.3(H) -      Capillary Blood Glucose: Lab Results  Component Value Date   GLUCAP 150 (H) 09/02/2019   GLUCAP 165 (H) 09/02/2019   GLUCAP 181 (H) 07/11/2019   GLUCAP 200 (H) 07/10/2019   GLUCAP 211 (H) 07/10/2019     Exercise Target Goals: Exercise Program Goal: Individual exercise prescription set using results from initial 6 min walk test and THRR while considering  patient's activity barriers and safety.   Exercise Prescription Goal: Initial exercise prescription builds to 30-45 minutes a day of aerobic activity, 2-3 days per week.  Home exercise guidelines will be given to patient during program as part of exercise prescription  that the participant will acknowledge.  Activity Barriers & Risk Stratification:  Activity Barriers & Cardiac Risk Stratification - 08/27/19 1100      Activity Barriers & Cardiac Risk Stratification   Activity Barriers Shortness of Breath    Cardiac Risk Stratification Moderate           6 Minute Walk:  6 Minute Walk    Row Name 08/27/19 0953         6 Minute Walk   Phase Initial     Distance 1005 feet     Walk Time 6 minutes  Stopped at 4 mins 49 sec due to SOB.     # of Rest Breaks 0     MPH 1.9     METS 3.34     RPE 13     Perceived Dyspnea  1.5     VO2 Peak 11.71     Symptoms Yes (comment)      Comments Patient c/o mild SOB with walking. Patient unable to continue at 4 minutes 49 seconds secondary to SOB.     Resting HR 89 bpm     Resting BP 98/62     Resting Oxygen Saturation  99 %     Exercise Oxygen Saturation  during 6 min walk 99 %     Max Ex. HR 96 bpm     Max Ex. BP 105/71     2 Minute Post BP 104/71            Oxygen Initial Assessment:   Oxygen Re-Evaluation:   Oxygen Discharge (Final Oxygen Re-Evaluation):   Initial Exercise Prescription:  Initial Exercise Prescription - 08/27/19 1000      Date of Initial Exercise RX and Referring Provider   Date 08/27/19    Referring Provider Josue Hector, MD    Expected Discharge Date 10/25/19      Treadmill   MPH --    Grade --    Minutes --    METs --      Recumbant Bike   Level 2    Minutes 15    METs 2.3      NuStep   Level 2    SPM 85    Minutes 15    METs 2.3      Prescription Details   Frequency (times per week) 3    Duration Progress to 30 minutes of continuous aerobic without signs/symptoms of physical distress      Intensity   THRR 40-80% of Max Heartrate 68-136    Ratings of Perceived Exertion 11-13    Perceived Dyspnea 0-4      Progression   Progression Continue to progress workloads to maintain intensity without signs/symptoms of physical distress.      Resistance Training   Training Prescription Yes    Weight 4lbs    Reps 10-15           Perform Capillary Blood Glucose checks as needed.  Exercise Prescription Changes:   Exercise Prescription Changes    Row Name 09/02/19 0717             Response to Exercise   Blood Pressure (Admit) 98/70       Blood Pressure (Exercise) 96/62       Blood Pressure (Exit) 92/66       Heart Rate (Admit) 84 bpm       Heart Rate (Exercise) 98 bpm       Heart Rate (Exit) 86 bpm  Rating of Perceived Exertion (Exercise) 12       Symptoms none       Comments Off to a good start with exercise.       Duration Continue with 30 min  of aerobic exercise without signs/symptoms of physical distress.       Intensity THRR unchanged         Progression   Progression Continue to progress workloads to maintain intensity without signs/symptoms of physical distress.       Average METs 2.3         Resistance Training   Training Prescription Yes       Weight 4lbs       Reps 10-15       Time 10 Minutes         Interval Training   Interval Training No         Recumbant Bike   Level 2       Minutes 15       METs 2.3         NuStep   Level 2       SPM 85       Minutes 15       METs 2.3              Exercise Comments:   Exercise Comments    Row Name 09/02/19 0817           Exercise Comments Patient tolerated 1st session of exercise well without symptoms.              Exercise Goals and Review:   Exercise Goals    Row Name 08/27/19 0937             Exercise Goals   Increase Physical Activity Yes       Intervention Provide advice, education, support and counseling about physical activity/exercise needs.;Develop an individualized exercise prescription for aerobic and resistive training based on initial evaluation findings, risk stratification, comorbidities and participant's personal goals.       Expected Outcomes Short Term: Attend rehab on a regular basis to increase amount of physical activity.;Long Term: Exercising regularly at least 3-5 days a week.;Long Term: Add in home exercise to make exercise part of routine and to increase amount of physical activity.       Increase Strength and Stamina Yes       Intervention Provide advice, education, support and counseling about physical activity/exercise needs.;Develop an individualized exercise prescription for aerobic and resistive training based on initial evaluation findings, risk stratification, comorbidities and participant's personal goals.       Expected Outcomes Short Term: Increase workloads from initial exercise prescription for resistance, speed,  and METs.;Short Term: Perform resistance training exercises routinely during rehab and add in resistance training at home;Long Term: Improve cardiorespiratory fitness, muscular endurance and strength as measured by increased METs and functional capacity (6MWT)       Able to understand and use rate of perceived exertion (RPE) scale Yes       Intervention Provide education and explanation on how to use RPE scale       Expected Outcomes Short Term: Able to use RPE daily in rehab to express subjective intensity level;Long Term:  Able to use RPE to guide intensity level when exercising independently       Knowledge and understanding of Target Heart Rate Range (THRR) Yes       Intervention Provide education and explanation of THRR including how the numbers  were predicted and where they are located for reference       Expected Outcomes Short Term: Able to state/look up THRR;Long Term: Able to use THRR to govern intensity when exercising independently;Short Term: Able to use daily as guideline for intensity in rehab       Able to check pulse independently Yes       Intervention Provide education and demonstration on how to check pulse in carotid and radial arteries.;Review the importance of being able to check your own pulse for safety during independent exercise       Expected Outcomes Short Term: Able to explain why pulse checking is important during independent exercise;Long Term: Able to check pulse independently and accurately       Understanding of Exercise Prescription Yes       Intervention Provide education, explanation, and written materials on patient's individual exercise prescription       Expected Outcomes Short Term: Able to explain program exercise prescription;Long Term: Able to explain home exercise prescription to exercise independently              Exercise Goals Re-Evaluation :  Exercise Goals Re-Evaluation    Row Name 09/02/19 0817             Exercise Goal Re-Evaluation    Exercise Goals Review Increase Physical Activity;Able to understand and use rate of perceived exertion (RPE) scale       Comments Patient able to understand and use RPE scale appropriately.       Expected Outcomes Increase workloads as tolerated to help improve cardiorespiratory fitness.              Discharge Exercise Prescription (Final Exercise Prescription Changes):  Exercise Prescription Changes - 09/02/19 0717      Response to Exercise   Blood Pressure (Admit) 98/70    Blood Pressure (Exercise) 96/62    Blood Pressure (Exit) 92/66    Heart Rate (Admit) 84 bpm    Heart Rate (Exercise) 98 bpm    Heart Rate (Exit) 86 bpm    Rating of Perceived Exertion (Exercise) 12    Symptoms none    Comments Off to a good start with exercise.    Duration Continue with 30 min of aerobic exercise without signs/symptoms of physical distress.    Intensity THRR unchanged      Progression   Progression Continue to progress workloads to maintain intensity without signs/symptoms of physical distress.    Average METs 2.3      Resistance Training   Training Prescription Yes    Weight 4lbs    Reps 10-15    Time 10 Minutes      Interval Training   Interval Training No      Recumbant Bike   Level 2    Minutes 15    METs 2.3      NuStep   Level 2    SPM 85    Minutes 15    METs 2.3           Nutrition:  Target Goals: Understanding of nutrition guidelines, daily intake of sodium <1552m, cholesterol <2061m calories 30% from fat and 7% or less from saturated fats, daily to have 5 or more servings of fruits and vegetables.  Biometrics:  Pre Biometrics - 08/27/19 0949      Pre Biometrics   Waist Circumference 36.75 inches    Hip Circumference 37.75 inches    Waist to Hip Ratio 0.97 %    Triceps  Skinfold 14 mm    % Body Fat 24.6 %    Grip Strength 47.5 kg    Flexibility 10.5 in    Single Leg Stand 30 seconds            Nutrition Therapy Plan and Nutrition  Goals:   Nutrition Assessments:   Nutrition Goals Re-Evaluation:   Nutrition Goals Re-Evaluation:   Nutrition Goals Discharge (Final Nutrition Goals Re-Evaluation):   Psychosocial: Target Goals: Acknowledge presence or absence of significant depression and/or stress, maximize coping skills, provide positive support system. Participant is able to verbalize types and ability to use techniques and skills needed for reducing stress and depression.  Initial Review & Psychosocial Screening:  Initial Psych Review & Screening - 08/27/19 1016      Initial Review   Current issues with Current Anxiety/Panic      Family Dynamics   Good Support System? Yes    Comments Shawn Landry presents to his cardiac rehab orientation appointment with a positive attitude and outlook however he does admit to having significant anxiety about his recent cardiac event and stenting. States he went from "feeling healthy and active" to having an MI and new diagnosis of DM and hyperlipidemia. He is not married but has a girlfriend that he sees occassionally. He does not have children but lots of friends, several close friends that he is able to share his anxieties with. He denies need for counseling but he states he is going to discuss his anxiety with his PCP on Friday 08/29/19.      Barriers   Psychosocial barriers to participate in program The patient should benefit from training in stress management and relaxation.;Psychosocial barriers identified (see note)      Screening Interventions   Interventions Encouraged to exercise;Provide feedback about the scores to participant;To provide support and resources with identified psychosocial needs    Expected Outcomes Short Term goal: Utilizing psychosocial counselor, staff and physician to assist with identification of specific Stressors or current issues interfering with healing process. Setting desired goal for each stressor or current issue identified.;Long Term Goal:  Stressors or current issues are controlled or eliminated.;Short Term goal: Identification and review with participant of any Quality of Life or Depression concerns found by scoring the questionnaire.;Long Term goal: The participant improves quality of Life and PHQ9 Scores as seen by post scores and/or verbalization of changes           Quality of Life Scores:  Quality of Life - 08/27/19 1049      Quality of Life   Select Quality of Life      Quality of Life Scores   Health/Function Pre 21.3 %    Socioeconomic Pre 27.36 %    Psych/Spiritual Pre 28.29 %    Family Pre 27 %    GLOBAL Pre 24.76 %          Scores of 19 and below usually indicate a poorer quality of life in these areas.  A difference of  2-3 points is a clinically meaningful difference.  A difference of 2-3 points in the total score of the Quality of Life Index has been associated with significant improvement in overall quality of life, self-image, physical symptoms, and general health in studies assessing change in quality of life.  PHQ-9: Recent Review Flowsheet Data    Depression screen [facility] 2/9 08/27/2019 08/02/2019   Decreased Interest 0 0   Down, Depressed, Hopeless 0 0   PHQ - 2 Score 0 0  Interpretation of Total Score  Total Score Depression Severity:  1-4 = Minimal depression, 5-9 = Mild depression, 10-14 = Moderate depression, 15-19 = Moderately severe depression, 20-27 = Severe depression   Psychosocial Evaluation and Intervention:  Psychosocial Evaluation - 09/02/19 0855      Psychosocial Evaluation & Interventions   Interventions Encouraged to exercise with the program and follow exercise prescription;Relaxation education;Stress management education    Comments Shawn Landry presents to his first cardiac rehab exercise session today with a positive attitude and outlook however he does admit to having significant anxiety about his recent cardiac event and stenting. He is eager to participate in CR. States he  went from "feeling healthy and active" to having an MI and new diagnosis of DM and hyperlipidemia. He is not married but has a girlfriend that he sees occassionally. He does not have children but lots of friends, several close friends that he is able to share his anxieties with. He denies need for counseling. Discussed with PCP during last appointment. Shawn Landry enjoys outdoor activities and hopes to gain confidence to enjoy these activities again.    Expected Outcomes Shawn Landry will continue to have a positive outlook and attitude. As he grows more confident in managing his CAD risk factors, he will voice a decrease in anxiety. He will continue to engage with his PCP for anxiety symptom management. He will utilize his support system as needed.    Continue Psychosocial Services  Follow up required by staff           Psychosocial Re-Evaluation:   Psychosocial Discharge (Final Psychosocial Re-Evaluation):   Vocational Rehabilitation: Provide vocational rehab assistance to qualifying candidates.   Vocational Rehab Evaluation & Intervention:  Vocational Rehab - 08/27/19 0952      Initial Vocational Rehab Evaluation & Intervention   Assessment shows need for Vocational Rehabilitation No      Vocational Rehab Re-Evaulation   Comments patient is currently on short-term disability until completion of cardiac rehab. 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 Participant will state understanding/return demonstration of topics presented.  Learning Barriers/Preferences:   Education Topics: Count Your Pulse:  -Group instruction provided by verbal instruction, demonstration, patient participation and written materials to support subject.  Instructors address importance of being able to find your pulse and how to count your pulse when at home  without a heart monitor.  Patients get hands on experience counting their pulse with staff help and individually.   Heart Attack, Angina, and Risk Factor Modification:  -Group instruction provided by verbal instruction, video, and written materials to support subject.  Instructors address signs and symptoms of angina and heart attacks.    Also discuss risk factors for heart disease and how to make changes to improve heart health risk factors.   Functional Fitness:  -Group instruction provided by verbal instruction, demonstration, patient participation, and written materials to support subject.  Instructors address safety measures for doing things around the house.  Discuss how to get up and down off the floor, how to pick things up properly, how to safely get out of a chair without assistance, and balance training.   Meditation and Mindfulness:  -Group instruction provided by verbal instruction, patient participation, and written materials to support subject.  Instructor addresses importance of mindfulness and meditation practice to help reduce  stress and improve awareness.  Instructor also leads participants through a meditation exercise.    Stretching for Flexibility and Mobility:  -Group instruction provided by verbal instruction, patient participation, and written materials to support subject.  Instructors lead participants through series of stretches that are designed to increase flexibility thus improving mobility.  These stretches are additional exercise for major muscle groups that are typically performed during regular warm up and cool down.   Hands Only CPR:  -Group verbal, video, and participation provides a basic overview of AHA guidelines for community CPR. Role-play of emergencies allow participants the opportunity to practice calling for help and chest compression technique with discussion of AED use.   Hypertension: -Group verbal and written instruction that provides a basic  overview of hypertension including the most recent diagnostic guidelines, risk factor reduction with self-care instructions and medication management.    Nutrition I class: Heart Healthy Eating:  -Group instruction provided by PowerPoint slides, verbal discussion, and written materials to support subject matter. The instructor gives an explanation and review of the Therapeutic Lifestyle Changes diet recommendations, which includes a discussion on lipid goals, dietary fat, sodium, fiber, plant stanol/sterol esters, sugar, and the components of a well-balanced, healthy diet.   Nutrition II class: Lifestyle Skills:  -Group instruction provided by PowerPoint slides, verbal discussion, and written materials to support subject matter. The instructor gives an explanation and review of label reading, grocery shopping for heart health, heart healthy recipe modifications, and ways to make healthier choices when eating out.   Diabetes Question & Answer:  -Group instruction provided by PowerPoint slides, verbal discussion, and written materials to support subject matter. The instructor gives an explanation and review of diabetes co-morbidities, pre- and post-prandial blood glucose goals, pre-exercise blood glucose goals, signs, symptoms, and treatment of hypoglycemia and hyperglycemia, and foot care basics.   Diabetes Blitz:  -Group instruction provided by PowerPoint slides, verbal discussion, and written materials to support subject matter. The instructor gives an explanation and review of the physiology behind type 1 and type 2 diabetes, diabetes medications and rational behind using different medications, pre- and post-prandial blood glucose recommendations and Hemoglobin A1c goals, diabetes diet, and exercise including blood glucose guidelines for exercising safely.    Portion Distortion:  -Group instruction provided by PowerPoint slides, verbal discussion, written materials, and food models to support  subject matter. The instructor gives an explanation of serving size versus portion size, changes in portions sizes over the last 20 years, and what consists of a serving from each food group.   Stress Management:  -Group instruction provided by verbal instruction, video, and written materials to support subject matter.  Instructors review role of stress in heart disease and how to cope with stress positively.     Exercising on Your Own:  -Group instruction provided by verbal instruction, power point, and written materials to support subject.  Instructors discuss benefits of exercise, components of exercise, frequency and intensity of exercise, and end points for exercise.  Also discuss use of nitroglycerin and activating EMS.  Review options of places to exercise outside of rehab.  Review guidelines for sex with heart disease.   Cardiac Drugs I:  -Group instruction provided by verbal instruction and written materials to support subject.  Instructor reviews cardiac drug classes: antiplatelets, anticoagulants, beta blockers, and statins.  Instructor discusses reasons, side effects, and lifestyle considerations for each drug class.   Cardiac Drugs II:  -Group instruction provided by verbal instruction and written materials to support subject.  Instructor reviews cardiac drug classes: angiotensin converting enzyme inhibitors (ACE-I), angiotensin II receptor blockers (ARBs), nitrates, and calcium channel blockers.  Instructor discusses reasons, side effects, and lifestyle considerations for each drug class.   Anatomy and Physiology of the Circulatory System:  Group verbal and written instruction and models provide basic cardiac anatomy and physiology, with the coronary electrical and arterial systems. Review of: AMI, Angina, Valve disease, Heart Failure, Peripheral Artery Disease, Cardiac Arrhythmia, Pacemakers, and the ICD.   Other Education:  -Group or individual verbal, written, or video  instructions that support the educational goals of the cardiac rehab program.   Holiday Eating Survival Tips:  -Group instruction provided by PowerPoint slides, verbal discussion, and written materials to support subject matter. The instructor gives patients tips, tricks, and techniques to help them not only survive but enjoy the holidays despite the onslaught of food that accompanies the holidays.   Knowledge Questionnaire Score:  Knowledge Questionnaire Score - 08/27/19 1048      Knowledge Questionnaire Score   Pre Score 20/24           Core Components/Risk Factors/Patient Goals at Admission:  Personal Goals and Risk Factors at Admission - 08/27/19 1049      Core Components/Risk Factors/Patient Goals on Admission   Diabetes Yes    Intervention Provide education about signs/symptoms and action to take for hypo/hyperglycemia.;Provide education about proper nutrition, including hydration, and aerobic/resistive exercise prescription along with prescribed medications to achieve blood glucose in normal ranges: Fasting glucose 65-99 mg/dL    Expected Outcomes Short Term: Participant verbalizes understanding of the signs/symptoms and immediate care of hyper/hypoglycemia, proper foot care and importance of medication, aerobic/resistive exercise and nutrition plan for blood glucose control.;Long Term: Attainment of HbA1C < 7%.    Hypertension Yes    Intervention Provide education on lifestyle modifcations including regular physical activity/exercise, weight management, moderate sodium restriction and increased consumption of fresh fruit, vegetables, and low fat dairy, alcohol moderation, and smoking cessation.;Monitor prescription use compliance.    Expected Outcomes Short Term: Continued assessment and intervention until BP is < 140/34m HG in hypertensive participants. < 130/892mHG in hypertensive participants with diabetes, heart failure or chronic kidney disease.;Long Term: Maintenance of  blood pressure at goal levels.    Stress Yes    Intervention Offer individual and/or small group education and counseling on adjustment to heart disease, stress management and health-related lifestyle change. Teach and support self-help strategies.;Refer participants experiencing significant psychosocial distress to appropriate mental health specialists for further evaluation and treatment. When possible, include family members and significant others in education/counseling sessions.    Expected Outcomes Short Term: Participant demonstrates changes in health-related behavior, relaxation and other stress management skills, ability to obtain effective social support, and compliance with psychotropic medications if prescribed.;Long Term: Emotional wellbeing is indicated by absence of clinically significant psychosocial distress or social isolation.           Core Components/Risk Factors/Patient Goals Review:   Goals and Risk Factor Review    Row Name 09/02/19 0902             Core Components/Risk Factors/Patient Goals Review   Personal Goals Review Diabetes;Stress;Hypertension       Review Shawn Landry multiple CAD risk factors and is eager to participate in CR for modification. He has significantly decrease his A1C through diet and medications. His BP is well controlled. His goals are to get back to a "sence of normalcy", decrease his shortness of breath and fatigue with activities.  He hopes to develop an exercise and healthy lifestyle routine he can maintain.       Expected Outcomes Patient will continue to participate in CR for risk factor modification.              Core Components/Risk Factors/Patient Goals at Discharge (Final Review):   Goals and Risk Factor Review - 09/02/19 0902      Core Components/Risk Factors/Patient Goals Review   Personal Goals Review Diabetes;Stress;Hypertension    Review Shawn Landry has multiple CAD risk factors and is eager to participate in CR for  modification. He has significantly decrease his A1C through diet and medications. His BP is well controlled. His goals are to get back to a "sence of normalcy", decrease his shortness of breath and fatigue with activities. He hopes to develop an exercise and healthy lifestyle routine he can maintain.    Expected Outcomes Patient will continue to participate in CR for risk factor modification.           ITP Comments:  ITP Comments    Row Name 08/27/19 0949 09/02/19 0854         ITP Comments Dr. Fransico Him, Medical Director Cardiac Rehab [facility] 30 day ITP review: Shawn Landry completed his first cardiac rehab exercise session today and tolerated very well. Denied cardiac complaints. Worked to an RPE of 11-13. VSS. No cardiac concerns noted.             Comments: see ITP comments

## 2019-09-02 NOTE — Progress Notes (Signed)
Daily Session Note  Patient Details  Name: Shawn Landry MRN: [id number] Date of Birth: 11-Jan-1971 Referring Provider:     CARDIAC REHAB PHASE II ORIENTATION from 08/27/2019 in [facility]  Referring Provider Josue Hector, MD      Encounter Date: 09/02/2019  Check In:  Session Check In - 09/02/19 0711      Check-In   Supervising physician immediately available to respond to emergencies [facility] immediately available    Physician(s) Dr. Pietro Cassis    Location [facility]    Staff Present Trish Fountain, RN, Deland Pretty, MS, ACSM CEP, Exercise Physiologist;Annedrea Rosezella [place], RN, MHA;Jessica Hassell Done, MS, ACSM-CEP, Exercise Physiologist;Tyara Carol Ada, MS,ACSM CEP, Exercise Physiologist    Virtual Visit No    Medication changes reported     No    Fall or balance concerns reported    No    Tobacco Cessation No Change    Warm-up and Cool-down Performed on first and last piece of equipment    Resistance Training Performed Yes    VAD Patient? No    PAD/SET Patient? No      Pain Assessment   Currently in Pain? No/denies    Multiple Pain Sites No           Capillary Blood Glucose: Results for orders placed or performed during the hospital encounter of 09/02/19 (from the past 24 hour(s))  Glucose, capillary     Status: Abnormal   Collection Time: 09/02/19  7:08 AM  Result Value Ref Range   Glucose-Capillary 165 (H) 70 - 99 mg/dL  Glucose, capillary     Status: Abnormal   Collection Time: 09/02/19  8:05 AM  Result Value Ref Range   Glucose-Capillary 150 (H) 70 - 99 mg/dL      Social History   Tobacco Use  Smoking Status Former Smoker  . Quit date: 2010  . Years since quitting: 11.5  Smokeless Tobacco Never Used    Goals Met:  Exercise tolerated well No report of cardiac concerns or symptoms Strength training completed today  Goals Unmet:  Not Applicable  Comments: Pt started cardiac rehab today.  Pt  tolerated light exercise without difficulty. VSS, telemetry-NSR inv T wave (MD aware with no new orders), asymptomatic.  Medication list reconciled. Pt denies barriers to medicaiton compliance.  PSYCHOSOCIAL ASSESSMENT:  PHQ-0. Pt exhibits positive coping skills, hopeful outlook with supportive family. No psychosocial needs identified at this time, no psychosocial interventions necessary.  Pt oriented to exercise equipment and routine.    Understanding verbalized.   Dr. Fransico Him is Medical Director for Cardiac Rehab at [facility].

## 2019-09-04 ENCOUNTER — Encounter (HOSPITAL_COMMUNITY)
Admission: RE | Admit: 2019-09-04 | Discharge: 2019-09-04 | Disposition: A | Discharge status: Home / Self Care | Payer: BC Managed Care – PPO | Source: Ambulatory Visit | Attending: Cardiovascular Disease | Admitting: Cardiovascular Disease

## 2019-09-04 ENCOUNTER — Other Ambulatory Visit: Payer: Self-pay

## 2019-09-04 DIAGNOSIS — Z955 Presence of coronary angioplasty implant and graft: Secondary | ICD-10-CM

## 2019-09-04 DIAGNOSIS — I214 Non-ST elevation (NSTEMI) myocardial infarction: Secondary | ICD-10-CM | POA: Diagnosis not present

## 2019-09-06 ENCOUNTER — Other Ambulatory Visit: Payer: Self-pay

## 2019-09-06 ENCOUNTER — Encounter (HOSPITAL_COMMUNITY)
Admission: RE | Admit: 2019-09-06 | Discharge: 2019-09-06 | Disposition: A | Discharge status: Home / Self Care | Payer: BC Managed Care – PPO | Source: Ambulatory Visit | Attending: Cardiovascular Disease | Admitting: Cardiovascular Disease

## 2019-09-06 DIAGNOSIS — Z955 Presence of coronary angioplasty implant and graft: Secondary | ICD-10-CM | POA: Diagnosis not present

## 2019-09-06 DIAGNOSIS — I214 Non-ST elevation (NSTEMI) myocardial infarction: Secondary | ICD-10-CM | POA: Diagnosis not present

## 2019-09-08 NOTE — Progress Notes (Signed)
Cardiology Office Note   Date:  09/13/2019   ID:  Shawn Landry, DOB 02-21-1970, MRN [id number]  PCP:  Antony Contras, MD  Cardiologist: Dr. Johnsie Cancel, MD   No chief complaint on file.   History of Present Illness: Shawn Landry is a 49 y.o. male who presents for post hospital follow-up,     Shawn Landry has a history of hyperlipidemia and GERD who presented to [facility] on 07/09/2019 for the evaluation of chest pain.  Patient reported that he was on his way home from work the evening prior to presentation when he had sudden onset of severe chest pain/pressure with radiation to his bilateral shoulders and occasional left arm involvement.  He did have associated shortness of breath however he felt this was due to the pain.  Was experiencing some diaphoretic episodes as well.  He reported a history of sudden cardiac death in his brother at age 34yo.   He was admitted with cardiology consultation.  Plan was initially for cardiac CTA however patient's heart rate was too elevated to proceed therefore he was scheduled for a Lexiscan stress test.  In nuclear medicine, patient continued to have ongoing chest pain.  Troponin levels found to be elevated from 23>> 155>> 420.  Stress test was canceled and plan was for [facility] which was performed on 07/09/2019 with proximal OM stenosis with DES/PCI placement.  Also noted to have 50% mid LAD, moderate to severe proximal diagonal disease in 1 and 2 and proximal to mid RCA disease at 50% all of which are to be treated medically for now.  EF was noted to be 45 to 50% with follow-up echocardiogram showing EF at 50 to 55% with no valvular disease.  He has been compliant with his medications including ASA and Brilinta.  Denies chest pain, shortness of breath, LE edema, orthopnea, PND, dizziness or syncope. Has been participating in cardiac rehab    Had a nose bleed last week and went to ER waited a long time No angina carrying nitro with him  LDL at goal 66 on statin BS  control much better   Past Medical History:  Diagnosis Date   Coronary artery disease    Diabetes mellitus without complication (HCC)    GERD (gastroesophageal reflux disease)    Hyperlipidemia     Past Surgical History:  Procedure Laterality Date   CORONARY STENT INTERVENTION  07/09/2019   CORONARY STENT INTERVENTION N/A 07/09/2019   Procedure: CORONARY STENT INTERVENTION;  Surgeon: Belva Crome, MD;  Location: [place] [facility] [facility];  Service: Cardiovascular;  Laterality: N/A;   LEFT HEART CATH AND CORONARY ANGIOGRAPHY N/A 07/09/2019   Procedure: LEFT HEART CATH AND CORONARY ANGIOGRAPHY;  Surgeon: Belva Crome, MD;  Location: [facility];  Service: Cardiovascular;  Laterality: N/A;   ROTATOR CUFF REPAIR Right      Current Outpatient Medications  Medication Sig Dispense Refill   aspirin EC 81 MG tablet Take 81 mg by mouth daily.     atorvastatin (LIPITOR) 80 MG tablet Take 1 tablet (80 mg total) by mouth daily. 90 tablet 3   blood glucose meter kit and supplies Dispense based on patient and insurance preference. Use up to four times daily as directed. (FOR ICD-10 E10.9, E11.9). 1 each 0   Continuous Blood Gluc Sensor (FREESTYLE LIBRE 2 SENSOR) MISC Use every 14 days 2 each 1   empagliflozin (JARDIANCE) 10 MG TABS tablet Take 1 tablet (10 mg total) by mouth daily  before breakfast. 30 tablet 3   metFORMIN (GLUCOPHAGE) 500 MG tablet Take 1 tablet (500 mg total) by mouth 2 (two) times daily with a meal. 60 tablet 1   metoprolol tartrate (LOPRESSOR) 50 MG tablet Take 1.5 tablets (75 mg total) by mouth 2 (two) times daily. 270 tablet 1   nitroGLYCERIN (NITROSTAT) 0.4 MG SL tablet Place 1 tablet (0.4 mg total) under the tongue every 5 (five) minutes x 3 doses as needed for chest pain. 30 tablet 0   pantoprazole (PROTONIX) 40 MG tablet Take 1 tablet (40 mg total) by mouth daily. 30 tablet 1   ticagrelor (BRILINTA) 90 MG TABS tablet Take 1 tablet (90 mg total) by mouth  2 (two) times daily. 180 tablet 3   No current facility-administered medications for this visit.    Allergies:   Patient has no known allergies.    Social History:  The patient  reports that he quit smoking about 11 years ago. He has never used smokeless tobacco. He reports current alcohol use. He reports that he does not use drugs.   Family History:  The patient's family history includes Sudden Cardiac Death (age of onset: 19) in his brother.    ROS:  Please see the history of present illness.   Otherwise, review of systems are positive for none. All other systems are reviewed and negative.    PHYSICAL EXAM: VS:  BP (!) 102/56    Pulse 82    Ht _0  (1.676 m)    Wt 158 lb (71.7 kg)    SpO2 98%    BMI 25.50 kg/m  , BMI Body mass index is 25.5 kg/m.   Affect appropriate Healthy:  appears stated age 34: normal Neck supple with no adenopathy JVP normal no bruits no thyromegaly Lungs clear with no wheezing and good diaphragmatic motion Heart:  S1/S2 no murmur, no rub, gallop or click PMI normal Abdomen: benighn, BS positve, no tenderness, no AAA no bruit.  No HSM or HJR Distal pulses intact with no bruits No edema Neuro non-focal Skin warm and dry No muscular weakness   EKG:   SR insignificant lateral Q waves 07/12/19  Recent Labs: 07/11/2019: BUN 9; Creatinine, Ser 0.69; Hemoglobin 11.4; Platelets 291; Potassium 3.7; Sodium 135 08/30/2019: ALT 22    Lipid Panel    Component Value Date/Time   CHOL 128 08/30/2019 0907   TRIG 127 08/30/2019 0907   HDL 39 (L) 08/30/2019 0907   CHOLHDL 3.3 08/30/2019 0907   CHOLHDL 5.5 07/09/2019 0520   VLDL 15 07/09/2019 0520   LDLCALC 66 08/30/2019 0907    Wt Readings from Last 3 Encounters:  09/13/19 158 lb (71.7 kg)  08/27/19 160 lb 0.9 oz (72.6 kg)  08/02/19 165 lb (74.8 kg)    Other studies Reviewed: Additional studies/ records that were reviewed today include: Review of the above records demonstrates:  [facility]  07/09/2019:   A stent was successfully placed.    Non-ST elevation myocardial infarction, late presenting, involving the proximal portion of the large obtuse marginal that supplies the lateral and inferoapical wall.  Successful PCI with stent implantation reducing 100% stenosis with TIMI grade 0 flow to 0% stenosis and TIMI grade III flow.  Patent left main   50% mid LAD disease and moderately severe proximal diagonal 1 and diagonal 2 disease (better treated with medication).  Diffuse luminal irregularities from proximal to distal RCA with proximal to mid 50 % narrowing.  Inferoapical moderate hypokinesis.  EF 45 to  50%.  LVEDP 17 mmHg.  RECOMMENDATIONS:   Aspirin and Brilinta x12 months  Aggressive risk factor modification including 50% reduction in LDL with target less than 70.  Aerobic exercise and consideration of cardiac rehab.  Hemoglobin A1c.  Continue to cycle cardiac markers.  Echocardiogram 07/09/2019:  1. Left ventricular ejection fraction, by estimation, is 50 to 55%. The  left ventricle has low normal function. The left ventricle demonstrates  regional wall motion abnormalities (see scoring diagram/findings for  description). Left ventricular diastolic  parameters were normal.  2. Right ventricular systolic function is normal. The right ventricular  size is normal. Tricuspid regurgitation signal is inadequate for assessing  PA pressure.  3. The mitral valve is grossly normal. No evidence of mitral valve  regurgitation. No evidence of mitral stenosis.  4. The aortic valve is tricuspid. Aortic valve regurgitation is not  visualized. No aortic stenosis is present.  5. The inferior vena cava is dilated in size with >50% respiratory  variability, suggesting right atrial pressure of 8 mmHg.   ASSESSMENT AND PLAN:  1. CAD s/p NSTEMI: -LHC with 100% stenosis in the proximal OM s/p DES/PCI.  Also noted to have 50% mid LAD stenosis, moderate to severe  proximal diagonal disease in 1 and 2 as well as proximal to mid RCA stenosis at 50%>> to treat medically -Plan is for DAPT with ASA and Brilinta x1 year>> tolerating well without signs or symptoms of hematoma or bleeding -Continue metoprolol, aspirin, Brilinta, statin -Echocardiogram EF 37-00%, LV diastolic parameters were normal -continue with cardiac rehab   2. New onset diabetes mellitus, type IIwith hyperglycemia: -Hemoglobin A1c 11.3, no prior for comparison -Patient discharged on Jardiance and Metformin>> needs follow-up with PCP  3. Hyperlipidemia -Lipidpanel shows total cholesterol 265, HDL 48, LDL 202, triglycerides 74 -Continue statin>> on RX LDL 66 labs done 08/30/19   4. GERD -Continue PPI   Current medicines are reviewed at length with the patient today.  The patient does not have concerns regarding medicines.  The following changes have been made:  no change  Labs/ tests ordered today include: none  No orders of the defined types were placed in this encounter.   Disposition:   FU with me in 6 months   Signed, Jenkins Rouge, MD  09/13/2019 10:11 AM    [facility] [address], [place], [place]  [address] Phone: [phone]; Fax: [phone]

## 2019-09-09 ENCOUNTER — Other Ambulatory Visit: Payer: Self-pay

## 2019-09-09 ENCOUNTER — Encounter (HOSPITAL_COMMUNITY)
Admission: RE | Admit: 2019-09-09 | Discharge: 2019-09-09 | Disposition: A | Discharge status: Home / Self Care | Payer: BC Managed Care – PPO | Source: Ambulatory Visit | Attending: Cardiovascular Disease | Admitting: Cardiovascular Disease

## 2019-09-09 DIAGNOSIS — Z955 Presence of coronary angioplasty implant and graft: Secondary | ICD-10-CM

## 2019-09-09 DIAGNOSIS — I214 Non-ST elevation (NSTEMI) myocardial infarction: Secondary | ICD-10-CM | POA: Diagnosis not present

## 2019-09-11 ENCOUNTER — Encounter (HOSPITAL_COMMUNITY): Payer: Self-pay | Admitting: *Deleted

## 2019-09-11 ENCOUNTER — Emergency Department (HOSPITAL_COMMUNITY)
Admission: EM | Admit: 2019-09-11 | Discharge: 2019-09-11 | Disposition: A | Discharge status: Home / Self Care | Payer: BC Managed Care – PPO | Attending: Emergency Medicine | Admitting: Emergency Medicine

## 2019-09-11 ENCOUNTER — Other Ambulatory Visit: Payer: Self-pay

## 2019-09-11 ENCOUNTER — Encounter (HOSPITAL_COMMUNITY): Payer: BC Managed Care – PPO

## 2019-09-11 DIAGNOSIS — R040 Epistaxis: Secondary | ICD-10-CM | POA: Diagnosis not present

## 2019-09-11 DIAGNOSIS — Z5321 Procedure and treatment not carried out due to patient leaving prior to being seen by health care provider: Secondary | ICD-10-CM | POA: Diagnosis not present

## 2019-09-11 NOTE — ED Notes (Signed)
Pt states he can't wait any longer. Informed pt he should wait and see a provider. Pt said sorry and left

## 2019-09-11 NOTE — ED Triage Notes (Signed)
Pt states he blew his nose tonight and his nose started bleeding (bilateral nostrils). Pt is on blood thinner and low dose ASA. He placed gauze in his nose, bleeding has subsided at this time.

## 2019-09-13 ENCOUNTER — Ambulatory Visit: Payer: BC Managed Care – PPO | Admitting: Cardiovascular Disease

## 2019-09-13 ENCOUNTER — Encounter: Payer: Self-pay | Admitting: Cardiovascular Disease

## 2019-09-13 ENCOUNTER — Other Ambulatory Visit: Payer: Self-pay

## 2019-09-13 ENCOUNTER — Encounter (HOSPITAL_COMMUNITY)
Admission: RE | Admit: 2019-09-13 | Discharge: 2019-09-13 | Disposition: A | Discharge status: Home / Self Care | Payer: BC Managed Care – PPO | Source: Ambulatory Visit | Attending: Cardiovascular Disease | Admitting: Cardiovascular Disease

## 2019-09-13 VITALS — BP 102/56 | HR 82 | Ht 66.0 in | Wt 158.0 lb

## 2019-09-13 DIAGNOSIS — I214 Non-ST elevation (NSTEMI) myocardial infarction: Secondary | ICD-10-CM | POA: Diagnosis not present

## 2019-09-13 DIAGNOSIS — Z955 Presence of coronary angioplasty implant and graft: Secondary | ICD-10-CM | POA: Diagnosis not present

## 2019-09-13 DIAGNOSIS — E782 Mixed hyperlipidemia: Secondary | ICD-10-CM

## 2019-09-13 NOTE — Progress Notes (Signed)
Shawn Landry 49 y.o. male Nutrition Note  Visit Diagnosis: NSTEMI (non-ST elevated myocardial infarction) [facility])  Status post coronary artery stent placement  Past Medical History:  Diagnosis Date  . Coronary artery disease   . Diabetes mellitus without complication ([place])   . GERD (gastroesophageal reflux disease)   . Hyperlipidemia      Medications reviewed.   Current Outpatient Medications:  .  aspirin EC 81 MG tablet, Take 81 mg by mouth daily., Disp: , Rfl:  .  atorvastatin (LIPITOR) 80 MG tablet, Take 1 tablet (80 mg total) by mouth daily., Disp: 90 tablet, Rfl: 3 .  blood glucose meter kit and supplies, Dispense based on patient and insurance preference. Use up to four times daily as directed. (FOR ICD-10 E10.9, E11.9)., Disp: 1 each, Rfl: 0 .  Continuous Blood Gluc Sensor (FREESTYLE LIBRE 2 SENSOR) MISC, Use every 14 days, Disp: 2 each, Rfl: 1 .  empagliflozin (JARDIANCE) 10 MG TABS tablet, Take 1 tablet (10 mg total) by mouth daily before breakfast., Disp: 30 tablet, Rfl: 3 .  metFORMIN (GLUCOPHAGE) 500 MG tablet, Take 1 tablet (500 mg total) by mouth 2 (two) times daily with a meal., Disp: 60 tablet, Rfl: 1 .  metoprolol tartrate (LOPRESSOR) 50 MG tablet, Take 1.5 tablets (75 mg total) by mouth 2 (two) times daily., Disp: 270 tablet, Rfl: 1 .  nitroGLYCERIN (NITROSTAT) 0.4 MG SL tablet, Place 1 tablet (0.4 mg total) under the tongue every 5 (five) minutes x 3 doses as needed for chest pain., Disp: 30 tablet, Rfl: 0 .  pantoprazole (PROTONIX) 40 MG tablet, Take 1 tablet (40 mg total) by mouth daily., Disp: 30 tablet, Rfl: 1 .  ticagrelor (BRILINTA) 90 MG TABS tablet, Take 1 tablet (90 mg total) by mouth 2 (two) times daily., Disp: 180 tablet, Rfl: 3   Ht Readings from Last 1 Encounters:  08/27/19 5' 6.25" (1.683 m)     Wt Readings from Last 3 Encounters:  08/27/19 160 lb 0.9 oz (72.6 kg)  08/02/19 165 lb (74.8 kg)  07/19/19 161 lb (73 kg)     There is no height or  weight on file to calculate BMI.   Social History   Tobacco Use  Smoking Status Former Smoker  . Quit date: 2010  . Years since quitting: 11.5  Smokeless Tobacco Never Used     Lab Results  Component Value Date   CHOL 128 08/30/2019   Lab Results  Component Value Date   HDL 39 (L) 08/30/2019   Lab Results  Component Value Date   LDLCALC 66 08/30/2019   Lab Results  Component Value Date   TRIG 127 08/30/2019     Lab Results  Component Value Date   HGBA1C 11.3 (H) 07/09/2019     CBG (last 3)  No results for input(s): GLUCAP in the last 72 hours.   Nutrition Note  Spoke with pt. Nutrition Plan and Nutrition Survey goals reviewed with pt. Pt is following a Heart Healthy diet.   Pt has Type 2 Diabetes. Last A1c indicates blood glucose well-controlled. Pt reports most recent A1C by PCP is between 6 and 7%. Pt with CGM - he checks glucose 10-15x throughout day. Previous 30 day average 118 mg/dl. Fasting CBG's reportedly 100-120 mg/dL. He went to diabetes education after his diagnosis in May. He reads labels and counts carbs.  He has reduced red meat. He incorporates fruits and veggies daily.   Eats out 4-5x/week. He tries to choose heart  healthy, low carb options. He typically takes half home to reduce sodium and saturated fat intake. He also asks for preparation without butter.   Pt expressed understanding of the information reviewed.  Nutrition Diagnosis ? Excessive sodium intake related to over consumption of restaurant meals as evidenced by pt report of eating out 4-5x/week  Nutrition Intervention ? Pt's individual nutrition plan reviewed with pt. ? Benefits of adopting Heart Healthy diet discussed when Medficts reviewed. ? Continue client-centered nutrition education by RD, as part of interdisciplinary care.  Goal(s) ? Pt to identify and limit food sources of saturated fat, trans fat, refined carbohydrates and sodium ? Pt to continue monitoring CBGs and keep  within safe range ? Pt to decrease eating out to 2x/week   Plan:    Will provide client-centered nutrition education as part of interdisciplinary care  Monitor and evaluate progress toward nutrition goal with team.   Michaele Offer, MS, RDN, LDN

## 2019-09-13 NOTE — Patient Instructions (Signed)

## 2019-09-16 ENCOUNTER — Other Ambulatory Visit: Payer: Self-pay

## 2019-09-16 ENCOUNTER — Encounter (HOSPITAL_COMMUNITY)
Admission: RE | Admit: 2019-09-16 | Discharge: 2019-09-16 | Disposition: A | Discharge status: Home / Self Care | Payer: BC Managed Care – PPO | Source: Ambulatory Visit | Attending: Cardiovascular Disease | Admitting: Cardiovascular Disease

## 2019-09-16 DIAGNOSIS — I214 Non-ST elevation (NSTEMI) myocardial infarction: Secondary | ICD-10-CM | POA: Insufficient documentation

## 2019-09-16 DIAGNOSIS — Z955 Presence of coronary angioplasty implant and graft: Secondary | ICD-10-CM | POA: Diagnosis not present

## 2019-09-18 ENCOUNTER — Encounter (HOSPITAL_COMMUNITY)
Admission: RE | Admit: 2019-09-18 | Discharge: 2019-09-18 | Disposition: A | Discharge status: Home / Self Care | Payer: BC Managed Care – PPO | Source: Ambulatory Visit | Attending: Cardiovascular Disease | Admitting: Cardiovascular Disease

## 2019-09-18 ENCOUNTER — Other Ambulatory Visit: Payer: Self-pay

## 2019-09-18 DIAGNOSIS — Z955 Presence of coronary angioplasty implant and graft: Secondary | ICD-10-CM | POA: Diagnosis not present

## 2019-09-18 DIAGNOSIS — I214 Non-ST elevation (NSTEMI) myocardial infarction: Secondary | ICD-10-CM | POA: Diagnosis not present

## 2019-09-20 ENCOUNTER — Other Ambulatory Visit: Payer: Self-pay

## 2019-09-20 ENCOUNTER — Encounter (HOSPITAL_COMMUNITY)
Admission: RE | Admit: 2019-09-20 | Discharge: 2019-09-20 | Disposition: A | Discharge status: Home / Self Care | Payer: BC Managed Care – PPO | Source: Ambulatory Visit | Attending: Cardiovascular Disease | Admitting: Cardiovascular Disease

## 2019-09-20 DIAGNOSIS — Z955 Presence of coronary angioplasty implant and graft: Secondary | ICD-10-CM | POA: Diagnosis not present

## 2019-09-20 DIAGNOSIS — I214 Non-ST elevation (NSTEMI) myocardial infarction: Secondary | ICD-10-CM

## 2019-09-23 ENCOUNTER — Other Ambulatory Visit: Payer: Self-pay

## 2019-09-23 ENCOUNTER — Encounter (HOSPITAL_COMMUNITY)
Admission: RE | Admit: 2019-09-23 | Discharge: 2019-09-23 | Disposition: A | Discharge status: Home / Self Care | Payer: BC Managed Care – PPO | Source: Ambulatory Visit | Attending: Cardiovascular Disease | Admitting: Cardiovascular Disease

## 2019-09-23 DIAGNOSIS — I214 Non-ST elevation (NSTEMI) myocardial infarction: Secondary | ICD-10-CM | POA: Diagnosis not present

## 2019-09-23 DIAGNOSIS — Z955 Presence of coronary angioplasty implant and graft: Secondary | ICD-10-CM | POA: Diagnosis not present

## 2019-09-23 NOTE — Progress Notes (Signed)
Reviewed home exercise guidelines with patient including endpoints, temperature precautions, target heart rate and rate of perceived exertion. Patient is walking and riding his bike as his mode of home exercise. Patient recently resumed riding his bike and was able to ride ~5 miles in 60 minutes without symptoms or issues. Patient is also walking about 20 minutes without issues. Encouraged patient to walk or ride at least 30 minutes, 2 days/week in addition to exercise at cardiac rehab to achieve at least 150 minutes of aerobic exercise/week, and patient is amenable to this. Patient is able to count his pulse. Patient voices understanding of instructions given.  Artist Pais, MS, ACSM CEP

## 2019-09-24 NOTE — Progress Notes (Signed)
Cardiac Individual Treatment Plan  Patient Details  Name: Shawn Landry MRN: [id number] Date of Birth: 07/14/1970 Referring Provider:     CARDIAC REHAB PHASE II ORIENTATION from 08/27/2019 in [place]  Referring Provider Josue Hector, MD      Initial Encounter Date:    CARDIAC REHAB PHASE II ORIENTATION from 08/27/2019 in [place]  Date 08/27/19      Visit Diagnosis: NSTEMI (non-ST elevated myocardial infarction) [facility])  Status post coronary artery stent placement  Patient's Home Medications on Admission:  Current Outpatient Medications:  .  aspirin EC 81 MG tablet, Take 81 mg by mouth daily., Disp: , Rfl:  .  atorvastatin (LIPITOR) 80 MG tablet, Take 1 tablet (80 mg total) by mouth daily., Disp: 90 tablet, Rfl: 3 .  blood glucose meter kit and supplies, Dispense based on patient and insurance preference. Use up to four times daily as directed. (FOR ICD-10 E10.9, E11.9)., Disp: 1 each, Rfl: 0 .  Continuous Blood Gluc Sensor (FREESTYLE LIBRE 2 SENSOR) MISC, Use every 14 days, Disp: 2 each, Rfl: 1 .  empagliflozin (JARDIANCE) 10 MG TABS tablet, Take 1 tablet (10 mg total) by mouth daily before breakfast., Disp: 30 tablet, Rfl: 3 .  metFORMIN (GLUCOPHAGE) 500 MG tablet, Take 1 tablet (500 mg total) by mouth 2 (two) times daily with a meal., Disp: 60 tablet, Rfl: 1 .  metoprolol tartrate (LOPRESSOR) 50 MG tablet, Take 1.5 tablets (75 mg total) by mouth 2 (two) times daily., Disp: 270 tablet, Rfl: 1 .  nitroGLYCERIN (NITROSTAT) 0.4 MG SL tablet, Place 1 tablet (0.4 mg total) under the tongue every 5 (five) minutes x 3 doses as needed for chest pain., Disp: 30 tablet, Rfl: 0 .  pantoprazole (PROTONIX) 40 MG tablet, Take 1 tablet (40 mg total) by mouth daily., Disp: 30 tablet, Rfl: 1 .  ticagrelor (BRILINTA) 90 MG TABS tablet, Take 1 tablet (90 mg total) by mouth 2 (two) times daily., Disp: 180 tablet, Rfl: 3  Past Medical  History: Past Medical History:  Diagnosis Date  . Coronary artery disease   . Diabetes mellitus without complication ([place])   . GERD (gastroesophageal reflux disease)   . Hyperlipidemia     Tobacco Use: Social History   Tobacco Use  Smoking Status Former Smoker  . Quit date: 2010  . Years since quitting: 11.6  Smokeless Tobacco Never Used    Labs: Recent Review Scientist, physiological    Labs for ITP Cardiac and Pulmonary Rehab Latest Ref Rng & Units 07/09/2019 08/30/2019   Cholestrol 100 - 199 mg/dL 265(H) 128   LDLCALC 0 - 99 mg/dL 202(H) 66   HDL >39 mg/dL 48 39(L)   Trlycerides 0 - 149 mg/dL 74 127   Hemoglobin A1c 4.8 - 5.6 % 11.3(H) -      Capillary Blood Glucose: Lab Results  Component Value Date   GLUCAP 150 (H) 09/02/2019   GLUCAP 165 (H) 09/02/2019   GLUCAP 181 (H) 07/11/2019   GLUCAP 200 (H) 07/10/2019   GLUCAP 211 (H) 07/10/2019     Exercise Target Goals: Exercise Program Goal: Individual exercise prescription set using results from initial 6 min walk test and THRR while considering  patient's activity barriers and safety.   Exercise Prescription Goal: Initial exercise prescription builds to 30-45 minutes a day of aerobic activity, 2-3 days per week.  Home exercise guidelines will be given to patient during program as part of exercise prescription  that the participant will acknowledge.  Activity Barriers & Risk Stratification:  Activity Barriers & Cardiac Risk Stratification - 08/27/19 1100      Activity Barriers & Cardiac Risk Stratification   Activity Barriers Shortness of Breath    Cardiac Risk Stratification Moderate           6 Minute Walk:  6 Minute Walk    Row Name 08/27/19 0953         6 Minute Walk   Phase Initial     Distance 1005 feet     Walk Time 6 minutes  Stopped at 4 mins 49 sec due to SOB.     # of Rest Breaks 0     MPH 1.9     METS 3.34     RPE 13     Perceived Dyspnea  1.5     VO2 Peak 11.71     Symptoms Yes (comment)      Comments Patient c/o mild SOB with walking. Patient unable to continue at 4 minutes 49 seconds secondary to SOB.     Resting HR 89 bpm     Resting BP 98/62     Resting Oxygen Saturation  99 %     Exercise Oxygen Saturation  during 6 min walk 99 %     Max Ex. HR 96 bpm     Max Ex. BP 105/71     2 Minute Post BP 104/71            Oxygen Initial Assessment:   Oxygen Re-Evaluation:   Oxygen Discharge (Final Oxygen Re-Evaluation):   Initial Exercise Prescription:  Initial Exercise Prescription - 08/27/19 1000      Date of Initial Exercise RX and Referring Provider   Date 08/27/19    Referring Provider Josue Hector, MD    Expected Discharge Date 10/25/19      Treadmill   MPH --    Grade --    Minutes --    METs --      Recumbant Bike   Level 2    Minutes 15    METs 2.3      NuStep   Level 2    SPM 85    Minutes 15    METs 2.3      Prescription Details   Frequency (times per week) 3    Duration Progress to 30 minutes of continuous aerobic without signs/symptoms of physical distress      Intensity   THRR 40-80% of Max Heartrate 68-136    Ratings of Perceived Exertion 11-13    Perceived Dyspnea 0-4      Progression   Progression Continue to progress workloads to maintain intensity without signs/symptoms of physical distress.      Resistance Training   Training Prescription Yes    Weight 4lbs    Reps 10-15           Perform Capillary Blood Glucose checks as needed.  Exercise Prescription Changes:   Exercise Prescription Changes    Row Name 09/02/19 0717 09/09/19 0704 09/23/19 0653         Response to Exercise   Blood Pressure (Admit) 98/70 92/62 90/62     Blood Pressure (Exercise) 96/62 104/68 118/72     Blood Pressure (Exit) 92/66 106/74 90/60  115/69 automatic cuff     Heart Rate (Admit) 84 bpm 80 bpm 94 bpm     Heart Rate (Exercise) 98 bpm 105 bpm 108 bpm  Heart Rate (Exit) 86 bpm 90 bpm 89 bpm     Rating of Perceived Exertion  (Exercise) _0 Symptoms none none none     Comments Off to a good start with exercise. Entered incorrect weight on recumbent bike --     Duration Continue with 30 min of aerobic exercise without signs/symptoms of physical distress. Continue with 30 min of aerobic exercise without signs/symptoms of physical distress. Continue with 30 min of aerobic exercise without signs/symptoms of physical distress.     Intensity THRR unchanged THRR unchanged THRR unchanged       Progression   Progression Continue to progress workloads to maintain intensity without signs/symptoms of physical distress. Continue to progress workloads to maintain intensity without signs/symptoms of physical distress. Continue to progress workloads to maintain intensity without signs/symptoms of physical distress.     Average METs 2.3 2.6 2.8       Resistance Training   Training Prescription Yes Yes Yes     Weight 4lbs 4lbs 4lbs     Reps 10-15 10-15 10-15     Time 10 Minutes 10 Minutes 10 Minutes       Interval Training   Interval Training No No No       Recumbant Bike   Level 2 2 2.5     Minutes _1 METs 2.3 2.7 2.7       NuStep   Level _2 SPM 85 85 85     Minutes _3 METs 2.3 2.6 2.9       Home Exercise Plan   Plans to continue exercise at -- -- Home (comment)  walking, biking     Frequency -- -- Add 2 additional days to program exercise sessions.     Initial Home Exercises Provided -- -- 09/23/19            Exercise Comments:   Exercise Comments    Row Name 09/02/19 0817 09/23/19 0659         Exercise Comments Patient tolerated 1st session of exercise well without symptoms. Reviewed home exercise guidliens, METs, and goals with patient.             Exercise Goals and Review:   Exercise Goals    Row Name 08/27/19 0937             Exercise Goals   Increase Physical Activity Yes       Intervention Provide advice, education, support and counseling about  physical activity/exercise needs.;Develop an individualized exercise prescription for aerobic and resistive training based on initial evaluation findings, risk stratification, comorbidities and participant's personal goals.       Expected Outcomes Short Term: Attend rehab on a regular basis to increase amount of physical activity.;Long Term: Exercising regularly at least 3-5 days a week.;Long Term: Add in home exercise to make exercise part of routine and to increase amount of physical activity.       Increase Strength and Stamina Yes       Intervention Provide advice, education, support and counseling about physical activity/exercise needs.;Develop an individualized exercise prescription for aerobic and resistive training based on initial evaluation findings, risk stratification, comorbidities and participant's personal goals.       Expected Outcomes Short Term: Increase workloads from initial exercise prescription for resistance, speed, and METs.;Short Term: Perform resistance training exercises routinely during rehab and add in resistance  training at home;Long Term: Improve cardiorespiratory fitness, muscular endurance and strength as measured by increased METs and functional capacity (6MWT)       Able to understand and use rate of perceived exertion (RPE) scale Yes       Intervention Provide education and explanation on how to use RPE scale       Expected Outcomes Short Term: Able to use RPE daily in rehab to express subjective intensity level;Long Term:  Able to use RPE to guide intensity level when exercising independently       Knowledge and understanding of Target Heart Rate Range (THRR) Yes       Intervention Provide education and explanation of THRR including how the numbers were predicted and where they are located for reference       Expected Outcomes Short Term: Able to state/look up THRR;Long Term: Able to use THRR to govern intensity when exercising independently;Short Term: Able to use  daily as guideline for intensity in rehab       Able to check pulse independently Yes       Intervention Provide education and demonstration on how to check pulse in carotid and radial arteries.;Review the importance of being able to check your own pulse for safety during independent exercise       Expected Outcomes Short Term: Able to explain why pulse checking is important during independent exercise;Long Term: Able to check pulse independently and accurately       Understanding of Exercise Prescription Yes       Intervention Provide education, explanation, and written materials on patient's individual exercise prescription       Expected Outcomes Short Term: Able to explain program exercise prescription;Long Term: Able to explain home exercise prescription to exercise independently              Exercise Goals Re-Evaluation :  Exercise Goals Re-Evaluation    Row Name 09/02/19 0817 09/23/19 0659           Exercise Goal Re-Evaluation   Exercise Goals Review Increase Physical Activity;Able to understand and use rate of perceived exertion (RPE) scale Increase Physical Activity;Able to understand and use rate of perceived exertion (RPE) scale;Understanding of Exercise Prescription;Increase Strength and Stamina;Able to check pulse independently;Knowledge and understanding of Target Heart Rate Range (THRR)      Comments Patient able to understand and use RPE scale appropriately. Reviewed home exercise guidelines with patient including endpoints, temperature precautions, target heart rate and rate of perceived exertion. Pt is walking and riding his bike as his mode of home exercise. Pt recently got back to riding his bike and was able to ride ~5 miles in 60 minutes without symptoms or issues. Pt is also walking about 20 minutes without issues. Encouraged patient to walk or ride at least 30 minutes, 2 days/week in addition to exercise at cardiac rehab to achieve at least 150 minutes of aerobic  exercise/week, and pt is amenable to this. Pt is able to count his pulse. Pt voices understanding of instructions given.      Expected Outcomes Increase workloads as tolerated to help improve cardiorespiratory fitness. Patient will walk or cycle at least 30 minutes, 2 days/week in addition to exercise at cardiac rehab to help increase energy and stamina.             Discharge Exercise Prescription (Final Exercise Prescription Changes):  Exercise Prescription Changes - 09/23/19 0653      Response to Exercise   Blood Pressure (Admit) 90/62  Blood Pressure (Exercise) 118/72    Blood Pressure (Exit) 90/60   115/69 automatic cuff   Heart Rate (Admit) 94 bpm    Heart Rate (Exercise) 108 bpm    Heart Rate (Exit) 89 bpm    Rating of Perceived Exertion (Exercise) 12    Symptoms none    Duration Continue with 30 min of aerobic exercise without signs/symptoms of physical distress.    Intensity THRR unchanged      Progression   Progression Continue to progress workloads to maintain intensity without signs/symptoms of physical distress.    Average METs 2.8      Resistance Training   Training Prescription Yes    Weight 4lbs    Reps 10-15    Time 10 Minutes      Interval Training   Interval Training No      Recumbant Bike   Level 2.5    Minutes 15    METs 2.7      NuStep   Level 3    SPM 85    Minutes 15    METs 2.9      Home Exercise Plan   Plans to continue exercise at Home (comment)   walking, biking   Frequency Add 2 additional days to program exercise sessions.    Initial Home Exercises Provided 09/23/19           Nutrition:  Target Goals: Understanding of nutrition guidelines, daily intake of sodium <1528m, cholesterol <2056m calories 30% from fat and 7% or less from saturated fats, daily to have 5 or more servings of fruits and vegetables.  Biometrics:  Pre Biometrics - 08/27/19 0949      Pre Biometrics   Waist Circumference 36.75 inches    Hip Circumference  37.75 inches    Waist to Hip Ratio 0.97 %    Triceps Skinfold 14 mm    % Body Fat 24.6 %    Grip Strength 47.5 kg    Flexibility 10.5 in    Single Leg Stand 30 seconds            Nutrition Therapy Plan and Nutrition Goals:  Nutrition Therapy & Goals - 09/13/19 0830      Nutrition Therapy   Diet Heart Healthy/Carb modified      Personal Nutrition Goals   Nutrition Goal Pt to identify and limit food sources of saturated fat, trans fat, refined carbohydrates and sodium    Personal Goal #2 Pt to continue monitoring CBGs and keep within safe range    Personal Goal #3 Pt to decrease eating out to 2x/week      Intervention Plan   Intervention Prescribe, educate and counsel regarding individualized specific dietary modifications aiming towards targeted core components such as weight, hypertension, lipid management, diabetes, heart failure and other comorbidities.;Nutrition handout(s) given to patient.   Label reading handout   Expected Outcomes Short Term Goal: A plan has been developed with personal nutrition goals set during dietitian appointment.;Long Term Goal: Adherence to prescribed nutrition plan.           Nutrition Assessments:  Nutrition Assessments - 09/13/19 0831      MEDFICTS Scores   Pre Score 18           Nutrition Goals Re-Evaluation:  Nutrition Goals Re-Evaluation    [place] 09/13/19 0830 09/24/19 0751           Goals   Current Weight 160 lb (72.6 kg) 158 lb (71.7 kg)  Nutrition Goal -- Pt to identify and limit food sources of saturated fat, trans fat, refined carbohydrates and sodium        Personal Goal #2 Re-Evaluation   Personal Goal #2 -- Pt to continue monitoring CBGs and keep within safe range        Personal Goal #3 Re-Evaluation   Personal Goal #3 -- Pt to decrease eating out to 2x/week             Nutrition Goals Re-Evaluation:  Nutrition Goals Re-Evaluation    [place] Name 09/13/19 0830 09/24/19 0751           Goals   Current  Weight 160 lb (72.6 kg) 158 lb (71.7 kg)      Nutrition Goal -- Pt to identify and limit food sources of saturated fat, trans fat, refined carbohydrates and sodium        Personal Goal #2 Re-Evaluation   Personal Goal #2 -- Pt to continue monitoring CBGs and keep within safe range        Personal Goal #3 Re-Evaluation   Personal Goal #3 -- Pt to decrease eating out to 2x/week             Nutrition Goals Discharge (Final Nutrition Goals Re-Evaluation):  Nutrition Goals Re-Evaluation - 09/24/19 0751      Goals   Current Weight 158 lb (71.7 kg)    Nutrition Goal Pt to identify and limit food sources of saturated fat, trans fat, refined carbohydrates and sodium      Personal Goal #2 Re-Evaluation   Personal Goal #2 Pt to continue monitoring CBGs and keep within safe range      Personal Goal #3 Re-Evaluation   Personal Goal #3 Pt to decrease eating out to 2x/week           Psychosocial: Target Goals: Acknowledge presence or absence of significant depression and/or stress, maximize coping skills, provide positive support system. Participant is able to verbalize types and ability to use techniques and skills needed for reducing stress and depression.  Initial Review & Psychosocial Screening:  Initial Psych Review & Screening - 08/27/19 1016      Initial Review   Current issues with Current Anxiety/Panic      Family Dynamics   Good Support System? Yes    Comments Mr. Fortin presents to his cardiac rehab orientation appointment with a positive attitude and outlook however he does admit to having significant anxiety about his recent cardiac event and stenting. States he went from "feeling healthy and active" to having an MI and new diagnosis of DM and hyperlipidemia. He is not married but has a girlfriend that he sees occassionally. He does not have children but lots of friends, several close friends that he is able to share his anxieties with. He denies need for counseling but he  states he is going to discuss his anxiety with his PCP on Friday 08/29/19.      Barriers   Psychosocial barriers to participate in program The patient should benefit from training in stress management and relaxation.;Psychosocial barriers identified (see note)      Screening Interventions   Interventions Encouraged to exercise;Provide feedback about the scores to participant;To provide support and resources with identified psychosocial needs    Expected Outcomes Short Term goal: Utilizing psychosocial counselor, staff and physician to assist with identification of specific Stressors or current issues interfering with healing process. Setting desired goal for each stressor or current issue identified.;Long Term Goal: Stressors or current  issues are controlled or eliminated.;Short Term goal: Identification and review with participant of any Quality of Life or Depression concerns found by scoring the questionnaire.;Long Term goal: The participant improves quality of Life and PHQ9 Scores as seen by post scores and/or verbalization of changes           Quality of Life Scores:  Quality of Life - 08/27/19 1049      Quality of Life   Select Quality of Life      Quality of Life Scores   Health/Function Pre 21.3 %    Socioeconomic Pre 27.36 %    Psych/Spiritual Pre 28.29 %    Family Pre 27 %    GLOBAL Pre 24.76 %          Scores of 19 and below usually indicate a poorer quality of life in these areas.  A difference of  2-3 points is a clinically meaningful difference.  A difference of 2-3 points in the total score of the Quality of Life Index has been associated with significant improvement in overall quality of life, self-image, physical symptoms, and general health in studies assessing change in quality of life.  PHQ-9: Recent Review Flowsheet Data    Depression screen [facility] 2/9 08/27/2019 08/02/2019   Decreased Interest 0 0   Down, Depressed, Hopeless 0 0   PHQ - 2 Score 0 0      Interpretation of Total Score  Total Score Depression Severity:  1-4 = Minimal depression, 5-9 = Mild depression, 10-14 = Moderate depression, 15-19 = Moderately severe depression, 20-27 = Severe depression   Psychosocial Evaluation and Intervention:  Psychosocial Evaluation - 09/02/19 0855      Psychosocial Evaluation & Interventions   Interventions Encouraged to exercise with the program and follow exercise prescription;Relaxation education;Stress management education    Comments Mr. Cabello presents to his first cardiac rehab exercise session today with a positive attitude and outlook however he does admit to having significant anxiety about his recent cardiac event and stenting. He is eager to participate in CR. States he went from "feeling healthy and active" to having an MI and new diagnosis of DM and hyperlipidemia. He is not married but has a girlfriend that he sees occassionally. He does not have children but lots of friends, several close friends that he is able to share his anxieties with. He denies need for counseling. Discussed with PCP during last appointment. Mr. Vinje enjoys outdoor activities and hopes to gain confidence to enjoy these activities again.    Expected Outcomes Mr. Cromie will continue to have a positive outlook and attitude. As he grows more confident in managing his CAD risk factors, he will voice a decrease in anxiety. He will continue to engage with his PCP for anxiety symptom management. He will utilize his support system as needed.    Continue Psychosocial Services  Follow up required by staff           Psychosocial Re-Evaluation:  Psychosocial Re-Evaluation    [place] Name 09/24/19 1321 09/24/19 1322           Psychosocial Re-Evaluation   Current issues with Current Anxiety/Panic --      Comments -- Mr. Denault continues to present to his cardiac rehab exercise sessions with a positive attitude and outlook. States his anxiety about his recent cardiac event  and self health management is decreasing as he grows more confident with his ability to exercise and manage his health. He remains eager to participate in CR.  States he went from "feeling healthy and active" to having an MI and new diagnosis of DM and hyperlipidemia. He is not married but has a girlfriend that he sees occassionally. He does not have children but lots of friends, several close friends that he is able to share his anxieties with. He feel he may be ready for counseling as his work is very stressful. He is encouraged to discuss with PCP and request referral to counseling. Mr. Schliep enjoys outdoor activities and hopes to gain confidence to enjoy these activities again.      Expected Outcomes -- Mr. Hoagland will continue to have a positive outlook and attitude. As he grows more confident in managing his CAD risk factors, he will voice a decrease in anxiety. He will continue to engage with his PCP for anxiety symptom management. He will utilize his support system as needed.      Interventions -- Physician referral;Encouraged to attend Cardiac Rehabilitation for the exercise;Relaxation education;Stress management education      Continue Psychosocial Services  -- Follow up required by staff             Psychosocial Discharge (Final Psychosocial Re-Evaluation):  Psychosocial Re-Evaluation - 09/24/19 1322      Psychosocial Re-Evaluation   Comments Mr. Shiraishi continues to present to his cardiac rehab exercise sessions with a positive attitude and outlook. States his anxiety about his recent cardiac event and self health management is decreasing as he grows more confident with his ability to exercise and manage his health. He remains eager to participate in CR. States he went from "feeling healthy and active" to having an MI and new diagnosis of DM and hyperlipidemia. He is not married but has a girlfriend that he sees occassionally. He does not have children but lots of friends, several close friends  that he is able to share his anxieties with. He feel he may be ready for counseling as his work is very stressful. He is encouraged to discuss with PCP and request referral to counseling. Mr. Rodgers enjoys outdoor activities and hopes to gain confidence to enjoy these activities again.    Expected Outcomes Mr. Barret will continue to have a positive outlook and attitude. As he grows more confident in managing his CAD risk factors, he will voice a decrease in anxiety. He will continue to engage with his PCP for anxiety symptom management. He will utilize his support system as needed.    Interventions Physician referral;Encouraged to attend Cardiac Rehabilitation for the exercise;Relaxation education;Stress management education    Continue Psychosocial Services  Follow up required by staff           Vocational Rehabilitation: Provide vocational rehab assistance to qualifying candidates.   Vocational Rehab Evaluation & Intervention:  Vocational Rehab - 08/27/19 0952      Initial Vocational Rehab Evaluation & Intervention   Assessment shows need for Vocational Rehabilitation No      Vocational Rehab Re-Evaulation   Comments patient is currently on short-term disability until completion of cardiac rehab. 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 Participant will state understanding/return demonstration of topics presented.  Learning Barriers/Preferences:   Education Topics: Count Your Pulse:  -Group instruction provided by verbal instruction, demonstration, patient participation and written materials to support subject.  Instructors address importance of being able to find  your pulse and how to count your pulse when at home without a heart monitor.  Patients get hands on experience counting their pulse with staff help and  individually.   Heart Attack, Angina, and Risk Factor Modification:  -Group instruction provided by verbal instruction, video, and written materials to support subject.  Instructors address signs and symptoms of angina and heart attacks.    Also discuss risk factors for heart disease and how to make changes to improve heart health risk factors.   Functional Fitness:  -Group instruction provided by verbal instruction, demonstration, patient participation, and written materials to support subject.  Instructors address safety measures for doing things around the house.  Discuss how to get up and down off the floor, how to pick things up properly, how to safely get out of a chair without assistance, and balance training.   Meditation and Mindfulness:  -Group instruction provided by verbal instruction, patient participation, and written materials to support subject.  Instructor addresses importance of mindfulness and meditation practice to help reduce stress and improve awareness.  Instructor also leads participants through a meditation exercise.    Stretching for Flexibility and Mobility:  -Group instruction provided by verbal instruction, patient participation, and written materials to support subject.  Instructors lead participants through series of stretches that are designed to increase flexibility thus improving mobility.  These stretches are additional exercise for major muscle groups that are typically performed during regular warm up and cool down.   Hands Only CPR:  -Group verbal, video, and participation provides a basic overview of AHA guidelines for community CPR. Role-play of emergencies allow participants the opportunity to practice calling for help and chest compression technique with discussion of AED use.   Hypertension: -Group verbal and written instruction that provides a basic overview of hypertension including the most recent diagnostic guidelines, risk factor reduction with  self-care instructions and medication management.    Nutrition I class: Heart Healthy Eating:  -Group instruction provided by PowerPoint slides, verbal discussion, and written materials to support subject matter. The instructor gives an explanation and review of the Therapeutic Lifestyle Changes diet recommendations, which includes a discussion on lipid goals, dietary fat, sodium, fiber, plant stanol/sterol esters, sugar, and the components of a well-balanced, healthy diet.   Nutrition II class: Lifestyle Skills:  -Group instruction provided by PowerPoint slides, verbal discussion, and written materials to support subject matter. The instructor gives an explanation and review of label reading, grocery shopping for heart health, heart healthy recipe modifications, and ways to make healthier choices when eating out.   Diabetes Question & Answer:  -Group instruction provided by PowerPoint slides, verbal discussion, and written materials to support subject matter. The instructor gives an explanation and review of diabetes co-morbidities, pre- and post-prandial blood glucose goals, pre-exercise blood glucose goals, signs, symptoms, and treatment of hypoglycemia and hyperglycemia, and foot care basics.   Diabetes Blitz:  -Group instruction provided by PowerPoint slides, verbal discussion, and written materials to support subject matter. The instructor gives an explanation and review of the physiology behind type 1 and type 2 diabetes, diabetes medications and rational behind using different medications, pre- and post-prandial blood glucose recommendations and Hemoglobin A1c goals, diabetes diet, and exercise including blood glucose guidelines for exercising safely.    Portion Distortion:  -Group instruction provided by PowerPoint slides, verbal discussion, written materials, and food models to support subject matter. The instructor gives an explanation of serving size versus portion size, changes in  portions sizes over the last  20 years, and what consists of a serving from each food group.   Stress Management:  -Group instruction provided by verbal instruction, video, and written materials to support subject matter.  Instructors review role of stress in heart disease and how to cope with stress positively.     Exercising on Your Own:  -Group instruction provided by verbal instruction, power point, and written materials to support subject.  Instructors discuss benefits of exercise, components of exercise, frequency and intensity of exercise, and end points for exercise.  Also discuss use of nitroglycerin and activating EMS.  Review options of places to exercise outside of rehab.  Review guidelines for sex with heart disease.   Cardiac Drugs I:  -Group instruction provided by verbal instruction and written materials to support subject.  Instructor reviews cardiac drug classes: antiplatelets, anticoagulants, beta blockers, and statins.  Instructor discusses reasons, side effects, and lifestyle considerations for each drug class.   Cardiac Drugs II:  -Group instruction provided by verbal instruction and written materials to support subject.  Instructor reviews cardiac drug classes: angiotensin converting enzyme inhibitors (ACE-I), angiotensin II receptor blockers (ARBs), nitrates, and calcium channel blockers.  Instructor discusses reasons, side effects, and lifestyle considerations for each drug class.   Anatomy and Physiology of the Circulatory System:  Group verbal and written instruction and models provide basic cardiac anatomy and physiology, with the coronary electrical and arterial systems. Review of: AMI, Angina, Valve disease, Heart Failure, Peripheral Artery Disease, Cardiac Arrhythmia, Pacemakers, and the ICD.   Other Education:  -Group or individual verbal, written, or video instructions that support the educational goals of the cardiac rehab program.   Holiday Eating Survival  Tips:  -Group instruction provided by PowerPoint slides, verbal discussion, and written materials to support subject matter. The instructor gives patients tips, tricks, and techniques to help them not only survive but enjoy the holidays despite the onslaught of food that accompanies the holidays.   Knowledge Questionnaire Score:  Knowledge Questionnaire Score - 08/27/19 1048      Knowledge Questionnaire Score   Pre Score 20/24           Core Components/Risk Factors/Patient Goals at Admission:  Personal Goals and Risk Factors at Admission - 08/27/19 1049      Core Components/Risk Factors/Patient Goals on Admission   Diabetes Yes    Intervention Provide education about signs/symptoms and action to take for hypo/hyperglycemia.;Provide education about proper nutrition, including hydration, and aerobic/resistive exercise prescription along with prescribed medications to achieve blood glucose in normal ranges: Fasting glucose 65-99 mg/dL    Expected Outcomes Short Term: Participant verbalizes understanding of the signs/symptoms and immediate care of hyper/hypoglycemia, proper foot care and importance of medication, aerobic/resistive exercise and nutrition plan for blood glucose control.;Long Term: Attainment of HbA1C < 7%.    Hypertension Yes    Intervention Provide education on lifestyle modifcations including regular physical activity/exercise, weight management, moderate sodium restriction and increased consumption of fresh fruit, vegetables, and low fat dairy, alcohol moderation, and smoking cessation.;Monitor prescription use compliance.    Expected Outcomes Short Term: Continued assessment and intervention until BP is < 140/71m HG in hypertensive participants. < 130/868mHG in hypertensive participants with diabetes, heart failure or chronic kidney disease.;Long Term: Maintenance of blood pressure at goal levels.    Stress Yes    Intervention Offer individual and/or small group education  and counseling on adjustment to heart disease, stress management and health-related lifestyle change. Teach and support self-help strategies.;Refer participants experiencing significant psychosocial distress  to appropriate mental health specialists for further evaluation and treatment. When possible, include family members and significant others in education/counseling sessions.    Expected Outcomes Short Term: Participant demonstrates changes in health-related behavior, relaxation and other stress management skills, ability to obtain effective social support, and compliance with psychotropic medications if prescribed.;Long Term: Emotional wellbeing is indicated by absence of clinically significant psychosocial distress or social isolation.           Core Components/Risk Factors/Patient Goals Review:   Goals and Risk Factor Review    Row Name 09/02/19 0902 09/24/19 1326           Core Components/Risk Factors/Patient Goals Review   Personal Goals Review Diabetes;Stress;Hypertension Diabetes;Stress;Hypertension      Review Mr. Perin has multiple CAD risk factors and is eager to participate in CR for modification. He has significantly decrease his A1C through diet and medications. His BP is well controlled. His goals are to get back to a "sence of normalcy", decrease his shortness of breath and fatigue with activities. He hopes to develop an exercise and healthy lifestyle routine he can maintain. Mr. Balboa has multiple CAD risk factors and is eager to participate in CR for modification. He has significantly decrease his A1C through diet and medications. His BP is well controlled. His goals are to get back to a "sence of normalcy", decrease his shortness of breath and fatigue with activities. He hopes to develop an exercise and healthy lifestyle routine he can maintain.      Expected Outcomes Patient will continue to participate in CR for risk factor modification. Patient will continue to participate in  CR for risk factor modification.             Core Components/Risk Factors/Patient Goals at Discharge (Final Review):   Goals and Risk Factor Review - 09/24/19 1326      Core Components/Risk Factors/Patient Goals Review   Personal Goals Review Diabetes;Stress;Hypertension    Review Mr. Kagawa has multiple CAD risk factors and is eager to participate in CR for modification. He has significantly decrease his A1C through diet and medications. His BP is well controlled. His goals are to get back to a "sence of normalcy", decrease his shortness of breath and fatigue with activities. He hopes to develop an exercise and healthy lifestyle routine he can maintain.    Expected Outcomes Patient will continue to participate in CR for risk factor modification.           ITP Comments:  ITP Comments    Row Name 08/27/19 0949 09/02/19 0854 09/24/19 1313       ITP Comments Dr. Fransico Him, Medical Director Cardiac Re[facility] 30 day ITP review: Mr. Kilburg completed his first cardiac rehab exercise session today and tolerated very well. Denied cardiac complaints. Worked to an RPE of 11-13. VSS. No cardiac concerns noted. 30 day ITP review: Mr. Castilleja has completed 9 cardiac rehab exercise sessions since admission. Continues to work to an RPE of 11-12. Tolerates workload increases as appropriate. States he is gaining more confidence with managing his health including his DM and CAD. He is working with RD on diet modifications, taking his medications as prescribed, and walking at home. Denied cardiac complaints. He is beginning to have more energy and strength. Shortness of breath with exertion is improving. He is on target to meet his longterm goal of establishing a healthy lifestyle routein.            Comments: see ITP comments

## 2019-09-25 ENCOUNTER — Other Ambulatory Visit: Payer: Self-pay

## 2019-09-25 ENCOUNTER — Encounter (HOSPITAL_COMMUNITY)
Admission: RE | Admit: 2019-09-25 | Discharge: 2019-09-25 | Disposition: A | Discharge status: Home / Self Care | Payer: BC Managed Care – PPO | Source: Ambulatory Visit | Attending: Cardiovascular Disease | Admitting: Cardiovascular Disease

## 2019-09-25 DIAGNOSIS — Z955 Presence of coronary angioplasty implant and graft: Secondary | ICD-10-CM

## 2019-09-25 DIAGNOSIS — I214 Non-ST elevation (NSTEMI) myocardial infarction: Secondary | ICD-10-CM | POA: Diagnosis not present

## 2019-09-27 ENCOUNTER — Other Ambulatory Visit: Payer: Self-pay

## 2019-09-27 ENCOUNTER — Encounter (HOSPITAL_COMMUNITY)
Admission: RE | Admit: 2019-09-27 | Discharge: 2019-09-27 | Disposition: A | Discharge status: Home / Self Care | Payer: BC Managed Care – PPO | Source: Ambulatory Visit | Attending: Cardiovascular Disease | Admitting: Cardiovascular Disease

## 2019-09-27 DIAGNOSIS — Z955 Presence of coronary angioplasty implant and graft: Secondary | ICD-10-CM | POA: Diagnosis not present

## 2019-09-27 DIAGNOSIS — I214 Non-ST elevation (NSTEMI) myocardial infarction: Secondary | ICD-10-CM

## 2019-09-30 ENCOUNTER — Encounter (HOSPITAL_COMMUNITY)
Admission: RE | Admit: 2019-09-30 | Discharge: 2019-09-30 | Disposition: A | Discharge status: Home / Self Care | Payer: BC Managed Care – PPO | Source: Ambulatory Visit | Attending: Cardiovascular Disease | Admitting: Cardiovascular Disease

## 2019-09-30 ENCOUNTER — Other Ambulatory Visit: Payer: Self-pay

## 2019-09-30 DIAGNOSIS — Z955 Presence of coronary angioplasty implant and graft: Secondary | ICD-10-CM | POA: Diagnosis not present

## 2019-09-30 DIAGNOSIS — I214 Non-ST elevation (NSTEMI) myocardial infarction: Secondary | ICD-10-CM

## 2019-10-02 ENCOUNTER — Encounter (HOSPITAL_COMMUNITY)
Admission: RE | Admit: 2019-10-02 | Discharge: 2019-10-02 | Disposition: A | Discharge status: Home / Self Care | Payer: BC Managed Care – PPO | Source: Ambulatory Visit | Attending: Cardiovascular Disease | Admitting: Cardiovascular Disease

## 2019-10-02 ENCOUNTER — Other Ambulatory Visit: Payer: Self-pay

## 2019-10-02 DIAGNOSIS — Z955 Presence of coronary angioplasty implant and graft: Secondary | ICD-10-CM | POA: Diagnosis not present

## 2019-10-02 DIAGNOSIS — I214 Non-ST elevation (NSTEMI) myocardial infarction: Secondary | ICD-10-CM

## 2019-10-04 ENCOUNTER — Encounter (HOSPITAL_COMMUNITY)
Admission: RE | Admit: 2019-10-04 | Discharge: 2019-10-04 | Disposition: A | Discharge status: Home / Self Care | Payer: BC Managed Care – PPO | Source: Ambulatory Visit | Attending: Cardiovascular Disease | Admitting: Cardiovascular Disease

## 2019-10-04 ENCOUNTER — Other Ambulatory Visit: Payer: Self-pay

## 2019-10-04 DIAGNOSIS — I214 Non-ST elevation (NSTEMI) myocardial infarction: Secondary | ICD-10-CM | POA: Diagnosis not present

## 2019-10-04 DIAGNOSIS — Z955 Presence of coronary angioplasty implant and graft: Secondary | ICD-10-CM

## 2019-10-07 ENCOUNTER — Other Ambulatory Visit: Payer: Self-pay

## 2019-10-07 ENCOUNTER — Encounter (HOSPITAL_COMMUNITY)
Admission: RE | Admit: 2019-10-07 | Discharge: 2019-10-07 | Disposition: A | Discharge status: Home / Self Care | Payer: BC Managed Care – PPO | Source: Ambulatory Visit | Attending: Cardiovascular Disease | Admitting: Cardiovascular Disease

## 2019-10-07 DIAGNOSIS — I214 Non-ST elevation (NSTEMI) myocardial infarction: Secondary | ICD-10-CM

## 2019-10-07 DIAGNOSIS — Z955 Presence of coronary angioplasty implant and graft: Secondary | ICD-10-CM | POA: Diagnosis not present

## 2019-10-09 ENCOUNTER — Encounter (HOSPITAL_COMMUNITY)
Admission: RE | Admit: 2019-10-09 | Discharge: 2019-10-09 | Disposition: A | Discharge status: Home / Self Care | Payer: BC Managed Care – PPO | Source: Ambulatory Visit | Attending: Cardiovascular Disease | Admitting: Cardiovascular Disease

## 2019-10-09 ENCOUNTER — Other Ambulatory Visit: Payer: Self-pay

## 2019-10-09 DIAGNOSIS — I214 Non-ST elevation (NSTEMI) myocardial infarction: Secondary | ICD-10-CM | POA: Diagnosis not present

## 2019-10-09 DIAGNOSIS — Z955 Presence of coronary angioplasty implant and graft: Secondary | ICD-10-CM | POA: Diagnosis not present

## 2019-10-11 ENCOUNTER — Other Ambulatory Visit: Payer: Self-pay

## 2019-10-11 ENCOUNTER — Encounter (HOSPITAL_COMMUNITY)
Admission: RE | Admit: 2019-10-11 | Discharge: 2019-10-11 | Disposition: A | Discharge status: Home / Self Care | Payer: BC Managed Care – PPO | Source: Ambulatory Visit | Attending: Cardiovascular Disease | Admitting: Cardiovascular Disease

## 2019-10-11 DIAGNOSIS — Z955 Presence of coronary angioplasty implant and graft: Secondary | ICD-10-CM | POA: Diagnosis not present

## 2019-10-11 DIAGNOSIS — I214 Non-ST elevation (NSTEMI) myocardial infarction: Secondary | ICD-10-CM | POA: Diagnosis not present

## 2019-10-14 ENCOUNTER — Encounter (HOSPITAL_COMMUNITY)
Admission: RE | Admit: 2019-10-14 | Discharge: 2019-10-14 | Disposition: A | Discharge status: Home / Self Care | Payer: BC Managed Care – PPO | Source: Ambulatory Visit | Attending: Cardiovascular Disease | Admitting: Cardiovascular Disease

## 2019-10-14 ENCOUNTER — Other Ambulatory Visit: Payer: Self-pay

## 2019-10-14 DIAGNOSIS — Z955 Presence of coronary angioplasty implant and graft: Secondary | ICD-10-CM | POA: Diagnosis not present

## 2019-10-14 DIAGNOSIS — I214 Non-ST elevation (NSTEMI) myocardial infarction: Secondary | ICD-10-CM | POA: Diagnosis not present

## 2019-10-15 ENCOUNTER — Other Ambulatory Visit: Payer: Self-pay | Admitting: Cardiology

## 2019-10-16 ENCOUNTER — Other Ambulatory Visit: Payer: Self-pay

## 2019-10-16 ENCOUNTER — Encounter (HOSPITAL_COMMUNITY)
Admission: RE | Admit: 2019-10-16 | Discharge: 2019-10-16 | Disposition: A | Discharge status: Home / Self Care | Payer: BC Managed Care – PPO | Source: Ambulatory Visit | Attending: Cardiovascular Disease | Admitting: Cardiovascular Disease

## 2019-10-16 DIAGNOSIS — I214 Non-ST elevation (NSTEMI) myocardial infarction: Secondary | ICD-10-CM | POA: Diagnosis not present

## 2019-10-16 DIAGNOSIS — Z955 Presence of coronary angioplasty implant and graft: Secondary | ICD-10-CM | POA: Diagnosis not present

## 2019-10-18 ENCOUNTER — Encounter (HOSPITAL_COMMUNITY)
Admission: RE | Admit: 2019-10-18 | Discharge: 2019-10-18 | Disposition: A | Discharge status: Home / Self Care | Payer: BC Managed Care – PPO | Source: Ambulatory Visit | Attending: Cardiovascular Disease | Admitting: Cardiovascular Disease

## 2019-10-18 ENCOUNTER — Other Ambulatory Visit: Payer: Self-pay

## 2019-10-18 DIAGNOSIS — E1169 Type 2 diabetes mellitus with other specified complication: Secondary | ICD-10-CM | POA: Diagnosis not present

## 2019-10-18 DIAGNOSIS — G629 Polyneuropathy, unspecified: Secondary | ICD-10-CM | POA: Diagnosis not present

## 2019-10-18 DIAGNOSIS — Z955 Presence of coronary angioplasty implant and graft: Secondary | ICD-10-CM | POA: Diagnosis not present

## 2019-10-18 DIAGNOSIS — I214 Non-ST elevation (NSTEMI) myocardial infarction: Secondary | ICD-10-CM | POA: Diagnosis not present

## 2019-10-18 DIAGNOSIS — D649 Anemia, unspecified: Secondary | ICD-10-CM | POA: Diagnosis not present

## 2019-10-18 DIAGNOSIS — F419 Anxiety disorder, unspecified: Secondary | ICD-10-CM | POA: Diagnosis not present

## 2019-10-22 MED ORDER — METOPROLOL TARTRATE 50 MG PO TABS: 75.0000 mg | ORAL_TABLET | Freq: Two times a day (BID) | ORAL | 3 refills | Status: DC

## 2019-10-22 NOTE — Telephone Encounter (Signed)
Pt is requesting a refill on empagliflozin (Jardiance), which was prescribed in the hospital, after having a cath. Would Dr. Eden Emms like to refill this medication? Please address

## 2019-10-22 NOTE — Progress Notes (Signed)
Cardiac Individual Treatment Plan  Patient Details  Name: Shawn Landry MRN: [id number] Date of Birth: 1970-03-22 Referring Provider:     CARDIAC REHAB PHASE II ORIENTATION from 08/27/2019 in [place]  Referring Provider Josue Hector, MD      Initial Encounter Date:    CARDIAC REHAB PHASE II ORIENTATION from 08/27/2019 in [place]  Date 08/27/19      Visit Diagnosis: Status post coronary artery stent placement  NSTEMI (non-ST elevated myocardial infarction) [facility])  Patient's Home Medications on Admission:  Current Outpatient Medications:  .  aspirin EC 81 MG tablet, Take 81 mg by mouth daily., Disp: , Rfl:  .  atorvastatin (LIPITOR) 80 MG tablet, Take 1 tablet (80 mg total) by mouth daily., Disp: 90 tablet, Rfl: 3 .  blood glucose meter kit and supplies, Dispense based on patient and insurance preference. Use up to four times daily as directed. (FOR ICD-10 E10.9, E11.9)., Disp: 1 each, Rfl: 0 .  Continuous Blood Gluc Sensor (FREESTYLE LIBRE 2 SENSOR) MISC, Use every 14 days, Disp: 2 each, Rfl: 1 .  empagliflozin (JARDIANCE) 10 MG TABS tablet, Take 1 tablet (10 mg total) by mouth daily before breakfast., Disp: 30 tablet, Rfl: 3 .  metFORMIN (GLUCOPHAGE) 500 MG tablet, Take 1 tablet (500 mg total) by mouth 2 (two) times daily with a meal., Disp: 60 tablet, Rfl: 1 .  metoprolol tartrate (LOPRESSOR) 50 MG tablet, Take 1.5 tablets (75 mg total) by mouth 2 (two) times daily., Disp: 270 tablet, Rfl: 3 .  nitroGLYCERIN (NITROSTAT) 0.4 MG SL tablet, Place 1 tablet (0.4 mg total) under the tongue every 5 (five) minutes x 3 doses as needed for chest pain., Disp: 30 tablet, Rfl: 0 .  pantoprazole (PROTONIX) 40 MG tablet, Take 1 tablet (40 mg total) by mouth daily., Disp: 30 tablet, Rfl: 1 .  ticagrelor (BRILINTA) 90 MG TABS tablet, Take 1 tablet (90 mg total) by mouth 2 (two) times daily., Disp: 180 tablet, Rfl: 3  Past Medical  History: Past Medical History:  Diagnosis Date  . Coronary artery disease   . Diabetes mellitus without complication ([place])   . GERD (gastroesophageal reflux disease)   . Hyperlipidemia     Tobacco Use: Social History   Tobacco Use  Smoking Status Former Smoker  . Quit date: 2010  . Years since quitting: 11.6  Smokeless Tobacco Never Used    Labs: Recent Review Scientist, physiological    Labs for ITP Cardiac and Pulmonary Rehab Latest Ref Rng & Units 07/09/2019 08/30/2019   Cholestrol 100 - 199 mg/dL 265(H) 128   LDLCALC 0 - 99 mg/dL 202(H) 66   HDL >39 mg/dL 48 39(L)   Trlycerides 0 - 149 mg/dL 74 127   Hemoglobin A1c 4.8 - 5.6 % 11.3(H) -      Capillary Blood Glucose: Lab Results  Component Value Date   GLUCAP 150 (H) 09/02/2019   GLUCAP 165 (H) 09/02/2019   GLUCAP 181 (H) 07/11/2019   GLUCAP 200 (H) 07/10/2019   GLUCAP 211 (H) 07/10/2019     Exercise Target Goals: Exercise Program Goal: Individual exercise prescription set using results from initial 6 min walk test and THRR while considering  patient's activity barriers and safety.   Exercise Prescription Goal: Initial exercise prescription builds to 30-45 minutes a day of aerobic activity, 2-3 days per week.  Home exercise guidelines will be given to patient during program as part of exercise prescription  that the participant will acknowledge.  Activity Barriers & Risk Stratification:  Activity Barriers & Cardiac Risk Stratification - 08/27/19 1100      Activity Barriers & Cardiac Risk Stratification   Activity Barriers Shortness of Breath    Cardiac Risk Stratification Moderate           6 Minute Walk:  6 Minute Walk    Row Name 08/27/19 0953         6 Minute Walk   Phase Initial     Distance 1005 feet     Walk Time 6 minutes  Stopped at 4 mins 49 sec due to SOB.     # of Rest Breaks 0     MPH 1.9     METS 3.34     RPE 13     Perceived Dyspnea  1.5     VO2 Peak 11.71     Symptoms Yes (comment)      Comments Patient c/o mild SOB with walking. Patient unable to continue at 4 minutes 49 seconds secondary to SOB.     Resting HR 89 bpm     Resting BP 98/62     Resting Oxygen Saturation  99 %     Exercise Oxygen Saturation  during 6 min walk 99 %     Max Ex. HR 96 bpm     Max Ex. BP 105/71     2 Minute Post BP 104/71            Oxygen Initial Assessment:   Oxygen Re-Evaluation:   Oxygen Discharge (Final Oxygen Re-Evaluation):   Initial Exercise Prescription:  Initial Exercise Prescription - 08/27/19 1000      Date of Initial Exercise RX and Referring Provider   Date 08/27/19    Referring Provider Josue Hector, MD    Expected Discharge Date 10/25/19      Treadmill   MPH --    Grade --    Minutes --    METs --      Recumbant Bike   Level 2    Minutes 15    METs 2.3      NuStep   Level 2    SPM 85    Minutes 15    METs 2.3      Prescription Details   Frequency (times per week) 3    Duration Progress to 30 minutes of continuous aerobic without signs/symptoms of physical distress      Intensity   THRR 40-80% of Max Heartrate 68-136    Ratings of Perceived Exertion 11-13    Perceived Dyspnea 0-4      Progression   Progression Continue to progress workloads to maintain intensity without signs/symptoms of physical distress.      Resistance Training   Training Prescription Yes    Weight 4lbs    Reps 10-15           Perform Capillary Blood Glucose checks as needed.  Exercise Prescription Changes:   Exercise Prescription Changes    Row Name 09/02/19 0717 09/09/19 0704 09/23/19 0653 10/07/19 0651 10/23/19 0654     Response to Exercise   Blood Pressure (Admit) $RemoveBeforeDE'98/70 92/62 90/62 '[id number]$ 92/76 92/72   Blood Pressure (Exercise) 96/62 104/68 118/72 100/76 108/68   Blood Pressure (Exit) 92/66 106/74 90/60  115/69 automatic cuff 102/68 105/67   Heart Rate (Admit) 84 bpm 80 bpm 94 bpm 82 bpm 85 bpm   Heart Rate (Exercise) 98 bpm 105 bpm 108 bpm 104 bpm  105 bpm     Heart Rate (Exit) 86 bpm 90 bpm 89 bpm 83 bpm 87 bpm   Rating of Perceived Exertion (Exercise) _0 Symptoms _1    Comments Off to a good start with exercise. Entered incorrect weight on recumbent bike -- -- --   Duration Continue with 30 min of aerobic exercise without signs/symptoms of physical distress. Continue with 30 min of aerobic exercise without signs/symptoms of physical distress. Continue with 30 min of aerobic exercise without signs/symptoms of physical distress. Continue with 30 min of aerobic exercise without signs/symptoms of physical distress. Continue with 30 min of aerobic exercise without signs/symptoms of physical distress.   Intensity _2      Progression   Progression Continue to progress workloads to maintain intensity without signs/symptoms of physical distress. Continue to progress workloads to maintain intensity without signs/symptoms of physical distress. Continue to progress workloads to maintain intensity without signs/symptoms of physical distress. Continue to progress workloads to maintain intensity without signs/symptoms of physical distress. Continue to progress workloads to maintain intensity without signs/symptoms of physical distress.   Average METs 2.3 2.6 2.8 3.1 2.9     Resistance Training   Training Prescription Yes Yes Yes Yes No   Weight 4lbs 4lbs 4lbs 5lbs --   Reps 10-15 10-15 10-15 10-15 --   Time 10 Minutes 10 Minutes 10 Minutes 10 Minutes --     Interval Training   Interval Training _3      Recumbant Bike   Level 2 2 2.5 2.5 3   Minutes _4 METs 2.3 2.7 2.7 2.8 2.8     NuStep   Level _5 SPM 85 85 85 85 85   Minutes _6 METs 2.3 2.6 2.9 3.4 3     Home Exercise Plan   Plans to continue exercise at -- -- Home (comment)  walking, biking Home (comment)  walking, biking Home (comment)  walking,  biking   Frequency -- -- Add 2 additional days to program exercise sessions. Add 2 additional days to program exercise sessions. Add 2 additional days to program exercise sessions.   Initial Home Exercises Provided -- -- 09/23/19 09/23/19 09/23/19          Exercise Comments:   Exercise Comments    Row Name 09/02/19 2863 09/23/19 0659 10/09/19 0720 10/23/19 8177     Exercise Comments Patient tolerated 1st session of exercise well without symptoms. Reviewed home exercise guidliens, METs, and goals with patient. Reviewed METs with patient. Reviewed METs and goals with patient.           Exercise Goals and Review:   Exercise Goals    Row Name 08/27/19 0937             Exercise Goals   Increase Physical Activity Yes       Intervention Provide advice, education, support and counseling about physical activity/exercise needs.;Develop an individualized exercise prescription for aerobic and resistive training based on initial evaluation findings, risk stratification, comorbidities and participant's personal goals.       Expected Outcomes Short Term: Attend rehab on a regular basis to increase amount of physical activity.;Long Term: Exercising regularly at least 3-5 days a week.;Long Term: Add in home exercise to make exercise part of routine and to increase amount of  physical activity.       Increase Strength and Stamina Yes       Intervention Provide advice, education, support and counseling about physical activity/exercise needs.;Develop an individualized exercise prescription for aerobic and resistive training based on initial evaluation findings, risk stratification, comorbidities and participant's personal goals.       Expected Outcomes Short Term: Increase workloads from initial exercise prescription for resistance, speed, and METs.;Short Term: Perform resistance training exercises routinely during rehab and add in resistance training at home;Long Term: Improve cardiorespiratory  fitness, muscular endurance and strength as measured by increased METs and functional capacity (6MWT)       Able to understand and use rate of perceived exertion (RPE) scale Yes       Intervention Provide education and explanation on how to use RPE scale       Expected Outcomes Short Term: Able to use RPE daily in rehab to express subjective intensity level;Long Term:  Able to use RPE to guide intensity level when exercising independently       Knowledge and understanding of Target Heart Rate Range (THRR) Yes       Intervention Provide education and explanation of THRR including how the numbers were predicted and where they are located for reference       Expected Outcomes Short Term: Able to state/look up THRR;Long Term: Able to use THRR to govern intensity when exercising independently;Short Term: Able to use daily as guideline for intensity in rehab       Able to check pulse independently Yes       Intervention Provide education and demonstration on how to check pulse in carotid and radial arteries.;Review the importance of being able to check your own pulse for safety during independent exercise       Expected Outcomes Short Term: Able to explain why pulse checking is important during independent exercise;Long Term: Able to check pulse independently and accurately       Understanding of Exercise Prescription Yes       Intervention Provide education, explanation, and written materials on patient's individual exercise prescription       Expected Outcomes Short Term: Able to explain program exercise prescription;Long Term: Able to explain home exercise prescription to exercise independently              Exercise Goals Re-Evaluation :  Exercise Goals Re-Evaluation    Row Name 09/02/19 0817 09/23/19 0659 10/23/19 0658         Exercise Goal Re-Evaluation   Exercise Goals Review Increase Physical Activity;Able to understand and use rate of perceived exertion (RPE) scale Increase Physical  Activity;Able to understand and use rate of perceived exertion (RPE) scale;Understanding of Exercise Prescription;Increase Strength and Stamina;Able to check pulse independently;Knowledge and understanding of Target Heart Rate Range (THRR) Increase Physical Activity;Able to understand and use rate of perceived exertion (RPE) scale;Understanding of Exercise Prescription;Increase Strength and Stamina;Able to check pulse independently;Knowledge and understanding of Target Heart Rate Range (THRR)     Comments Patient able to understand and use RPE scale appropriately. Reviewed home exercise guidelines with patient including endpoints, temperature precautions, target heart rate and rate of perceived exertion. Pt is walking and riding his bike as his mode of home exercise. Pt recently got back to riding his bike and was able to ride ~5 miles in 60 minutes without symptoms or issues. Pt is also walking about 20 minutes without issues. Encouraged patient to walk or ride at least 30 minutes, 2 days/week in addition  to exercise at cardiac rehab to achieve at least 150 minutes of aerobic exercise/week, and pt is amenable to this. Pt is able to count his pulse. Pt voices understanding of instructions given. Patient will complete the cardiac rehab program mext week. Patient recently purchased a stationary bike that he plans to use in addition to walking as his mode home exericse. Patient's goal is ride his bike everyday. Patient states that he's feeling better now than when he started the program.     Expected Outcomes Increase workloads as tolerated to help improve cardiorespiratory fitness. Patient will walk or cycle at least 30 minutes, 2 days/week in addition to exercise at cardiac rehab to help increase energy and stamina. Patient will walk or ride his bike daily to continue his exercise routine upon completion of the cardiac rehab program.            Discharge Exercise Prescription (Final Exercise Prescription  Changes):  Exercise Prescription Changes - 10/23/19 0654      Response to Exercise   Blood Pressure (Admit) 92/72    Blood Pressure (Exercise) 108/68    Blood Pressure (Exit) 105/67    Heart Rate (Admit) 85 bpm    Heart Rate (Exercise) 105 bpm    Heart Rate (Exit) 87 bpm    Rating of Perceived Exertion (Exercise) 12    Symptoms none    Duration Continue with 30 min of aerobic exercise without signs/symptoms of physical distress.    Intensity THRR unchanged      Progression   Progression Continue to progress workloads to maintain intensity without signs/symptoms of physical distress.    Average METs 2.9      Resistance Training   Training Prescription No      Interval Training   Interval Training No      Recumbant Bike   Level 3    Minutes 15    METs 2.8      NuStep   Level 4    SPM 85    Minutes 15    METs 3      Home Exercise Plan   Plans to continue exercise at Home (comment)   walking, biking   Frequency Add 2 additional days to program exercise sessions.    Initial Home Exercises Provided 09/23/19           Nutrition:  Target Goals: Understanding of nutrition guidelines, daily intake of sodium '1500mg'$ , cholesterol '200mg'$ , calories 30% from fat and 7% or less from saturated fats, daily to have 5 or more servings of fruits and vegetables.  Biometrics:  Pre Biometrics - 08/27/19 0949      Pre Biometrics   Waist Circumference 36.75 inches    Hip Circumference 37.75 inches    Waist to Hip Ratio 0.97 %    Triceps Skinfold 14 mm    % Body Fat 24.6 %    Grip Strength 47.5 kg    Flexibility 10.5 in    Single Leg Stand 30 seconds            Nutrition Therapy Plan and Nutrition Goals:  Nutrition Therapy & Goals - 09/13/19 0830      Nutrition Therapy   Diet Heart Healthy/Carb modified      Personal Nutrition Goals   Nutrition Goal Pt to identify and limit food sources of saturated fat, trans fat, refined carbohydrates and sodium    Personal Goal #2 Pt  to continue monitoring CBGs and keep within safe range    Personal Goal #  3 Pt to decrease eating out to 2x/week      Intervention Plan   Intervention Prescribe, educate and counsel regarding individualized specific dietary modifications aiming towards targeted core components such as weight, hypertension, lipid management, diabetes, heart failure and other comorbidities.;Nutrition handout(s) given to patient.   Label reading handout   Expected Outcomes Short Term Goal: A plan has been developed with personal nutrition goals set during dietitian appointment.;Long Term Goal: Adherence to prescribed nutrition plan.           Nutrition Assessments:  Nutrition Assessments - 09/13/19 0831      MEDFICTS Scores   Pre Score 18           Nutrition Goals Re-Evaluation:  Nutrition Goals Re-Evaluation    Row Name 09/13/19 0830 09/24/19 0751 10/22/19 0948         Goals   Current Weight 160 lb (72.6 kg) 158 lb (71.7 kg) 160 lb 4.4 oz (72.7 kg)     Nutrition Goal -- Pt to identify and limit food sources of saturated fat, trans fat, refined carbohydrates and sodium --     Comment -- -- Pt continues to follow a heart healthy diet and CBGs WNL       Personal Goal #2 Re-Evaluation   Personal Goal #2 -- Pt to continue monitoring CBGs and keep within safe range --       Personal Goal #3 Re-Evaluation   Personal Goal #3 -- Pt to decrease eating out to 2x/week --            Nutrition Goals Re-Evaluation:  Nutrition Goals Re-Evaluation    Row Name 09/13/19 0830 09/24/19 0751 10/22/19 0948         Goals   Current Weight 160 lb (72.6 kg) 158 lb (71.7 kg) 160 lb 4.4 oz (72.7 kg)     Nutrition Goal -- Pt to identify and limit food sources of saturated fat, trans fat, refined carbohydrates and sodium --     Comment -- -- Pt continues to follow a heart healthy diet and CBGs WNL       Personal Goal #2 Re-Evaluation   Personal Goal #2 -- Pt to continue monitoring CBGs and keep within safe range  --       Personal Goal #3 Re-Evaluation   Personal Goal #3 -- Pt to decrease eating out to 2x/week --            Nutrition Goals Discharge (Final Nutrition Goals Re-Evaluation):  Nutrition Goals Re-Evaluation - 10/22/19 0948      Goals   Current Weight 160 lb 4.4 oz (72.7 kg)    Comment Pt continues to follow a heart healthy diet and CBGs WNL           Psychosocial: Target Goals: Acknowledge presence or absence of significant depression and/or stress, maximize coping skills, provide positive support system. Participant is able to verbalize types and ability to use techniques and skills needed for reducing stress and depression.  Initial Review & Psychosocial Screening:  Initial Psych Review & Screening - 08/27/19 1016      Initial Review   Current issues with Current Anxiety/Panic      Family Dynamics   Good Support System? Yes    Comments Shawn Landry presents to his cardiac rehab orientation appointment with a positive attitude and outlook however he does admit to having significant anxiety about his recent cardiac event and stenting. States he went from "feeling healthy and active" to having an MI  and new diagnosis of DM and hyperlipidemia. He is not married but has a girlfriend that he sees occassionally. He does not have children but lots of friends, several close friends that he is able to share his anxieties with. He denies need for counseling but he states he is going to discuss his anxiety with his PCP on Friday 08/29/19.      Barriers   Psychosocial barriers to participate in program The patient should benefit from training in stress management and relaxation.;Psychosocial barriers identified (see note)      Screening Interventions   Interventions Encouraged to exercise;Provide feedback about the scores to participant;To provide support and resources with identified psychosocial needs    Expected Outcomes Short Term goal: Utilizing psychosocial counselor, staff and  physician to assist with identification of specific Stressors or current issues interfering with healing process. Setting desired goal for each stressor or current issue identified.;Long Term Goal: Stressors or current issues are controlled or eliminated.;Short Term goal: Identification and review with participant of any Quality of Life or Depression concerns found by scoring the questionnaire.;Long Term goal: The participant improves quality of Life and PHQ9 Scores as seen by post scores and/or verbalization of changes           Quality of Life Scores:  Quality of Life - 08/27/19 1049      Quality of Life   Select Quality of Life      Quality of Life Scores   Health/Function Pre 21.3 %    Socioeconomic Pre 27.36 %    Psych/Spiritual Pre 28.29 %    Family Pre 27 %    GLOBAL Pre 24.76 %          Scores of 19 and below usually indicate a poorer quality of life in these areas.  A difference of  2-3 points is a clinically meaningful difference.  A difference of 2-3 points in the total score of the Quality of Life Index has been associated with significant improvement in overall quality of life, self-image, physical symptoms, and general health in studies assessing change in quality of life.  PHQ-9: Recent Review Flowsheet Data    Depression screen [facility] 2/9 08/27/2019 08/02/2019   Decreased Interest 0 0   Down, Depressed, Hopeless 0 0   PHQ - 2 Score 0 0     Interpretation of Total Score  Total Score Depression Severity:  1-4 = Minimal depression, 5-9 = Mild depression, 10-14 = Moderate depression, 15-19 = Moderately severe depression, 20-27 = Severe depression   Psychosocial Evaluation and Intervention:  Psychosocial Evaluation - 09/02/19 0855      Psychosocial Evaluation & Interventions   Interventions Encouraged to exercise with the program and follow exercise prescription;Relaxation education;Stress management education    Comments Shawn Landry presents to his first cardiac rehab  exercise session today with a positive attitude and outlook however he does admit to having significant anxiety about his recent cardiac event and stenting. He is eager to participate in CR. States he went from "feeling healthy and active" to having an MI and new diagnosis of DM and hyperlipidemia. He is not married but has a girlfriend that he sees occassionally. He does not have children but lots of friends, several close friends that he is able to share his anxieties with. He denies need for counseling. Discussed with PCP during last appointment. Shawn Landry enjoys outdoor activities and hopes to gain confidence to enjoy these activities again.    Expected Outcomes Shawn Landry will continue  to have a positive outlook and attitude. As he grows more confident in managing his CAD risk factors, he will voice a decrease in anxiety. He will continue to engage with his PCP for anxiety symptom management. He will utilize his support system as needed.    Continue Psychosocial Services  Follow up required by staff           Psychosocial Re-Evaluation:  Psychosocial Re-Evaluation    [place] Name 09/24/19 1321 09/24/19 1322 10/22/19 1607         Psychosocial Re-Evaluation   Current issues with Current Anxiety/Panic -- None Identified     Comments -- Shawn Landry continues to present to his cardiac rehab exercise sessions with a positive attitude and outlook. States his anxiety about his recent cardiac event and self health management is decreasing as he grows more confident with his ability to exercise and manage his health. He remains eager to participate in CR. States he went from "feeling healthy and active" to having an MI and new diagnosis of DM and hyperlipidemia. He is not married but has a girlfriend that he sees occassionally. He does not have children but lots of friends, several close friends that he is able to share his anxieties with. He feel he may be ready for counseling as his work is very stressful. He  is encouraged to discuss with PCP and request referral to counseling. Shawn Landry enjoys outdoor activities and hopes to gain confidence to enjoy these activities again. Shawn Landry continues to present to his cardiac rehab exercise sessions with a positive attitude and outlook. States his anxiety about his recent cardiac event and self health management has significantly decreased as he has developed more confidence with his ability to exercise and manage his health. He remains eager to participate in CR. States he went from "feeling healthy and active" to having an MI and new diagnosis of DM and hyperlipidemia. He is not married but has a girlfriend that he sees occassionally. He does not have children but lots of friends, several close friends that he is able to share his anxieties with. He feel he may be ready for counseling as his work is very stressful. He is encouraged to discuss with PCP and request referral to counseling. Shawn Landry enjoys outdoor activities and hopes to gain confidence to enjoy these activities again.     Expected Outcomes -- Shawn Landry will continue to have a positive outlook and attitude. As he grows more confident in managing his CAD risk factors, he will voice a decrease in anxiety. He will continue to engage with his PCP for anxiety symptom management. He will utilize his support system as needed. Shawn Landry will continue to verbalize a decrease in his anxiety related to self health managment.     Interventions -- Physician referral;Encouraged to attend Cardiac Rehabilitation for the exercise;Relaxation education;Stress management education --     Continue Psychosocial Services  -- Follow up required by staff Follow up required by staff            Psychosocial Discharge (Final Psychosocial Re-Evaluation):  Psychosocial Re-Evaluation - 10/22/19 1607      Psychosocial Re-Evaluation   Current issues with None Identified    Comments Shawn Landry continues to present to his cardiac  rehab exercise sessions with a positive attitude and outlook. States his anxiety about his recent cardiac event and self health management has significantly decreased as he has developed more confidence with his ability to exercise and manage his health.  He remains eager to participate in CR. States he went from "feeling healthy and active" to having an MI and new diagnosis of DM and hyperlipidemia. He is not married but has a girlfriend that he sees occassionally. He does not have children but lots of friends, several close friends that he is able to share his anxieties with. He feel he may be ready for counseling as his work is very stressful. He is encouraged to discuss with PCP and request referral to counseling. Shawn Landry enjoys outdoor activities and hopes to gain confidence to enjoy these activities again.    Expected Outcomes Shawn Landry will continue to verbalize a decrease in his anxiety related to self health managment.    Continue Psychosocial Services  Follow up required by staff           Vocational Rehabilitation: Provide vocational rehab assistance to qualifying candidates.   Vocational Rehab Evaluation & Intervention:  Vocational Rehab - 08/27/19 0952      Initial Vocational Rehab Evaluation & Intervention   Assessment shows need for Vocational Rehabilitation No      Vocational Rehab Re-Evaulation   Comments patient is currently on short-term disability until completion of cardiac rehab. 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 Participant will state understanding/return demonstration of topics presented.  Learning Barriers/Preferences:   Education Topics: Count Your Pulse:  -Group instruction provided by verbal instruction, demonstration, patient participation and written materials to support subject.  Instructors address  importance of being able to find your pulse and how to count your pulse when at home without a heart monitor.  Patients get hands on experience counting their pulse with staff help and individually.   Heart Attack, Angina, and Risk Factor Modification:  -Group instruction provided by verbal instruction, video, and written materials to support subject.  Instructors address signs and symptoms of angina and heart attacks.    Also discuss risk factors for heart disease and how to make changes to improve heart health risk factors.   Functional Fitness:  -Group instruction provided by verbal instruction, demonstration, patient participation, and written materials to support subject.  Instructors address safety measures for doing things around the house.  Discuss how to get up and down off the floor, how to pick things up properly, how to safely get out of a chair without assistance, and balance training.   Meditation and Mindfulness:  -Group instruction provided by verbal instruction, patient participation, and written materials to support subject.  Instructor addresses importance of mindfulness and meditation practice to help reduce stress and improve awareness.  Instructor also leads participants through a meditation exercise.    Stretching for Flexibility and Mobility:  -Group instruction provided by verbal instruction, patient participation, and written materials to support subject.  Instructors lead participants through series of stretches that are designed to increase flexibility thus improving mobility.  These stretches are additional exercise for major muscle groups that are typically performed during regular warm up and cool down.   Hands Only CPR:  -Group verbal, video, and participation provides a basic overview of AHA guidelines for community CPR. Role-play of emergencies allow participants the opportunity to practice calling for help and chest compression technique with discussion of AED  use.   Hypertension: -Group verbal and written instruction that provides a basic overview of hypertension including the  most recent diagnostic guidelines, risk factor reduction with self-care instructions and medication management.    Nutrition I class: Heart Healthy Eating:  -Group instruction provided by PowerPoint slides, verbal discussion, and written materials to support subject matter. The instructor gives an explanation and review of the Therapeutic Lifestyle Changes diet recommendations, which includes a discussion on lipid goals, dietary fat, sodium, fiber, plant stanol/sterol esters, sugar, and the components of a well-balanced, healthy diet.   Nutrition II class: Lifestyle Skills:  -Group instruction provided by PowerPoint slides, verbal discussion, and written materials to support subject matter. The instructor gives an explanation and review of label reading, grocery shopping for heart health, heart healthy recipe modifications, and ways to make healthier choices when eating out.   Diabetes Question & Answer:  -Group instruction provided by PowerPoint slides, verbal discussion, and written materials to support subject matter. The instructor gives an explanation and review of diabetes co-morbidities, pre- and post-prandial blood glucose goals, pre-exercise blood glucose goals, signs, symptoms, and treatment of hypoglycemia and hyperglycemia, and foot care basics.   Diabetes Blitz:  -Group instruction provided by PowerPoint slides, verbal discussion, and written materials to support subject matter. The instructor gives an explanation and review of the physiology behind type 1 and type 2 diabetes, diabetes medications and rational behind using different medications, pre- and post-prandial blood glucose recommendations and Hemoglobin A1c goals, diabetes diet, and exercise including blood glucose guidelines for exercising safely.    Portion Distortion:  -Group instruction provided by  PowerPoint slides, verbal discussion, written materials, and food models to support subject matter. The instructor gives an explanation of serving size versus portion size, changes in portions sizes over the last 20 years, and what consists of a serving from each food group.   Stress Management:  -Group instruction provided by verbal instruction, video, and written materials to support subject matter.  Instructors review role of stress in heart disease and how to cope with stress positively.     Exercising on Your Own:  -Group instruction provided by verbal instruction, power point, and written materials to support subject.  Instructors discuss benefits of exercise, components of exercise, frequency and intensity of exercise, and end points for exercise.  Also discuss use of nitroglycerin and activating EMS.  Review options of places to exercise outside of rehab.  Review guidelines for sex with heart disease.   Cardiac Drugs I:  -Group instruction provided by verbal instruction and written materials to support subject.  Instructor reviews cardiac drug classes: antiplatelets, anticoagulants, beta blockers, and statins.  Instructor discusses reasons, side effects, and lifestyle considerations for each drug class.   Cardiac Drugs II:  -Group instruction provided by verbal instruction and written materials to support subject.  Instructor reviews cardiac drug classes: angiotensin converting enzyme inhibitors (ACE-I), angiotensin II receptor blockers (ARBs), nitrates, and calcium channel blockers.  Instructor discusses reasons, side effects, and lifestyle considerations for each drug class.   Anatomy and Physiology of the Circulatory System:  Group verbal and written instruction and models provide basic cardiac anatomy and physiology, with the coronary electrical and arterial systems. Review of: AMI, Angina, Valve disease, Heart Failure, Peripheral Artery Disease, Cardiac Arrhythmia, Pacemakers, and  the ICD.   Other Education:  -Group or individual verbal, written, or video instructions that support the educational goals of the cardiac rehab program.   Holiday Eating Survival Tips:  -Group instruction provided by PowerPoint slides, verbal discussion, and written materials to support subject matter. The instructor gives patients tips, tricks, and techniques  to help them not only survive but enjoy the holidays despite the onslaught of food that accompanies the holidays.   Knowledge Questionnaire Score:  Knowledge Questionnaire Score - 08/27/19 1048      Knowledge Questionnaire Score   Pre Score 20/24           Core Components/Risk Factors/Patient Goals at Admission:  Personal Goals and Risk Factors at Admission - 08/27/19 1049      Core Components/Risk Factors/Patient Goals on Admission   Diabetes Yes    Intervention Provide education about signs/symptoms and action to take for hypo/hyperglycemia.;Provide education about proper nutrition, including hydration, and aerobic/resistive exercise prescription along with prescribed medications to achieve blood glucose in normal ranges: Fasting glucose 65-99 mg/dL    Expected Outcomes Short Term: Participant verbalizes understanding of the signs/symptoms and immediate care of hyper/hypoglycemia, proper foot care and importance of medication, aerobic/resistive exercise and nutrition plan for blood glucose control.;Long Term: Attainment of HbA1C < 7%.    Hypertension Yes    Intervention Provide education on lifestyle modifcations including regular physical activity/exercise, weight management, moderate sodium restriction and increased consumption of fresh fruit, vegetables, and low fat dairy, alcohol moderation, and smoking cessation.;Monitor prescription use compliance.    Expected Outcomes Short Term: Continued assessment and intervention until BP is < 140/95mm HG in hypertensive participants. < 130/83mm HG in hypertensive participants with  diabetes, heart failure or chronic kidney disease.;Long Term: Maintenance of blood pressure at goal levels.    Stress Yes    Intervention Offer individual and/or small group education and counseling on adjustment to heart disease, stress management and health-related lifestyle change. Teach and support self-help strategies.;Refer participants experiencing significant psychosocial distress to appropriate mental health specialists for further evaluation and treatment. When possible, include family members and significant others in education/counseling sessions.    Expected Outcomes Short Term: Participant demonstrates changes in health-related behavior, relaxation and other stress management skills, ability to obtain effective social support, and compliance with psychotropic medications if prescribed.;Long Term: Emotional wellbeing is indicated by absence of clinically significant psychosocial distress or social isolation.           Core Components/Risk Factors/Patient Goals Review:   Goals and Risk Factor Review    Row Name 09/02/19 0902 09/24/19 1326 10/22/19 1609         Core Components/Risk Factors/Patient Goals Review   Personal Goals Review Diabetes;Stress;Hypertension Diabetes;Stress;Hypertension Diabetes;Stress;Hypertension     Review Shawn Landry has multiple CAD risk factors and is eager to participate in CR for modification. He has significantly decrease his A1C through diet and medications. His BP is well controlled. His goals are to get back to a "sence of normalcy", decrease his shortness of breath and fatigue with activities. He hopes to develop an exercise and healthy lifestyle routine he can maintain. Shawn Landry has multiple CAD risk factors and is eager to participate in CR for modification. He has significantly decrease his A1C through diet and medications. His BP is well controlled. His goals are to get back to a "sence of normalcy", decrease his shortness of breath and fatigue with  activities. He hopes to develop an exercise and healthy lifestyle routine he can maintain. Shawn Landry has multiple CAD risk factors and is eager to participate in CR for modification. He has changed is lifestyle to include a heart healthy diabetic diet and exercises daily. His stress levels have decreased related to having more confidence in maintaining his health. He is more confident with checking his blood sugars and understanding  how foods and exercise impact his readings.     Expected Outcomes Patient will continue to participate in CR for risk factor modification. Patient will continue to participate in CR for risk factor modification. Patient will continue to participate in CR for risk factor modification.            Core Components/Risk Factors/Patient Goals at Discharge (Final Review):   Goals and Risk Factor Review - 10/22/19 1609      Core Components/Risk Factors/Patient Goals Review   Personal Goals Review Diabetes;Stress;Hypertension    Review Shawn Landry has multiple CAD risk factors and is eager to participate in CR for modification. He has changed is lifestyle to include a heart healthy diabetic diet and exercises daily. His stress levels have decreased related to having more confidence in maintaining his health. He is more confident with checking his blood sugars and understanding how foods and exercise impact his readings.    Expected Outcomes Patient will continue to participate in CR for risk factor modification.           ITP Comments:  ITP Comments    Row Name 08/27/19 0949 09/02/19 0854 09/24/19 1313 10/22/19 1554     ITP Comments Dr. Fransico Him, Medical Director Cardiac Rehab [facility] 30 day ITP review: Mr. Nawabi completed his first cardiac rehab exercise session today and tolerated very well. Denied cardiac complaints. Worked to an RPE of 11-13. VSS. No cardiac concerns noted. 30 day ITP review: Mr. Hattabaugh has completed 9 cardiac rehab exercise sessions since  admission. Continues to work to an RPE of 11-12. Tolerates workload increases as appropriate. States he is gaining more confidence with managing his health including his DM and CAD. He is working with RD on diet modifications, taking his medications as prescribed, and walking at home. Denied cardiac complaints. He is beginning to have more energy and strength. Shortness of breath with exertion is improving. He is on target to meet his longterm goal of establishing a healthy lifestyle routein. 30 day ITP review: Mr. Kunzler has completed 20 cardiac rehab exercise sessions since admission. He will graduate from the program on 10/30/19. Mr. Rouch continues to work to an RPE of 11-13. Average mets 2.7-3.2. He admits to having more energy and strength at home. His shortness of breath with exertion is now minimal. He feels he has more confidence with managing his health including his DM and CAD. He has changed his diet to reflect a heart healthy, DM lifestyle. He is now walking on days he is not at cardiac rehab. He hopes to get back to biking when the weather turns cooler. He is on target to meet his personal goals by graduation.           Comments: see ITP comments

## 2019-10-22 NOTE — Telephone Encounter (Signed)
Patient needs to contact his PCP for Jardiance refill.

## 2019-10-23 ENCOUNTER — Other Ambulatory Visit: Payer: Self-pay

## 2019-10-23 ENCOUNTER — Encounter (HOSPITAL_COMMUNITY)
Admission: RE | Admit: 2019-10-23 | Discharge: 2019-10-23 | Disposition: A | Discharge status: Home / Self Care | Payer: BC Managed Care – PPO | Source: Ambulatory Visit | Attending: Cardiovascular Disease | Admitting: Cardiovascular Disease

## 2019-10-23 DIAGNOSIS — Z955 Presence of coronary angioplasty implant and graft: Secondary | ICD-10-CM

## 2019-10-23 DIAGNOSIS — I214 Non-ST elevation (NSTEMI) myocardial infarction: Secondary | ICD-10-CM | POA: Diagnosis not present

## 2019-10-25 ENCOUNTER — Other Ambulatory Visit: Payer: Self-pay

## 2019-10-25 ENCOUNTER — Encounter (HOSPITAL_COMMUNITY)
Admission: RE | Admit: 2019-10-25 | Discharge: 2019-10-25 | Disposition: A | Discharge status: Home / Self Care | Payer: BC Managed Care – PPO | Source: Ambulatory Visit | Attending: Cardiovascular Disease | Admitting: Cardiovascular Disease

## 2019-10-25 DIAGNOSIS — I214 Non-ST elevation (NSTEMI) myocardial infarction: Secondary | ICD-10-CM | POA: Diagnosis not present

## 2019-10-25 DIAGNOSIS — Z955 Presence of coronary angioplasty implant and graft: Secondary | ICD-10-CM

## 2019-10-28 ENCOUNTER — Other Ambulatory Visit: Payer: Self-pay

## 2019-10-28 ENCOUNTER — Encounter (HOSPITAL_COMMUNITY)
Admission: RE | Admit: 2019-10-28 | Discharge: 2019-10-28 | Disposition: A | Discharge status: Home / Self Care | Payer: BC Managed Care – PPO | Source: Ambulatory Visit | Attending: Cardiovascular Disease | Admitting: Cardiovascular Disease

## 2019-10-28 DIAGNOSIS — I214 Non-ST elevation (NSTEMI) myocardial infarction: Secondary | ICD-10-CM

## 2019-10-28 DIAGNOSIS — Z955 Presence of coronary angioplasty implant and graft: Secondary | ICD-10-CM | POA: Diagnosis not present

## 2019-10-29 ENCOUNTER — Encounter: Payer: Self-pay | Admitting: *Deleted

## 2019-10-30 ENCOUNTER — Ambulatory Visit: Payer: BC Managed Care – PPO | Admitting: Diagnostic Neuroimaging

## 2019-10-30 ENCOUNTER — Encounter (HOSPITAL_COMMUNITY)
Admission: RE | Admit: 2019-10-30 | Discharge: 2019-10-30 | Disposition: A | Discharge status: Home / Self Care | Payer: BC Managed Care – PPO | Source: Ambulatory Visit | Attending: Cardiovascular Disease | Admitting: Cardiovascular Disease

## 2019-10-30 ENCOUNTER — Other Ambulatory Visit: Payer: Self-pay

## 2019-10-30 ENCOUNTER — Encounter: Payer: Self-pay | Admitting: Diagnostic Neuroimaging

## 2019-10-30 VITALS — BP 104/66 | HR 81 | Ht 66.0 in | Wt 165.0 lb

## 2019-10-30 VITALS — BP 98/62 | HR 77 | Ht 66.25 in | Wt 165.3 lb

## 2019-10-30 DIAGNOSIS — R200 Anesthesia of skin: Secondary | ICD-10-CM | POA: Diagnosis not present

## 2019-10-30 DIAGNOSIS — M792 Neuralgia and neuritis, unspecified: Secondary | ICD-10-CM | POA: Diagnosis not present

## 2019-10-30 DIAGNOSIS — Z955 Presence of coronary angioplasty implant and graft: Secondary | ICD-10-CM

## 2019-10-30 DIAGNOSIS — I214 Non-ST elevation (NSTEMI) myocardial infarction: Secondary | ICD-10-CM

## 2019-10-30 NOTE — Progress Notes (Signed)
Discharge Progress Report  Patient Details  Name: Shawn Landry MRN: [id number] Date of Birth: Apr 30, 1970 Referring Provider:     CARDIAC REHAB PHASE II ORIENTATION from 08/27/2019 in [facility]  Referring Provider Josue Hector, MD       Number of Visits: 24  Reason for Discharge:  Patient reached a stable level of exercise. Patient independent in their exercise. Patient has met program and personal goals.  Smoking History:  Social History   Tobacco Use  Smoking Status Former Smoker  . Quit date: 2010  . Years since quitting: 11.7  Smokeless Tobacco Never Used    Diagnosis:  Status post coronary artery stent placement  NSTEMI (non-ST elevated myocardial infarction) ([place])  ADL [facility]:   Initial Exercise Prescription:  Initial Exercise Prescription - 08/27/19 1000      Date of Initial Exercise RX and Referring Provider   Date 08/27/19    Referring Provider Josue Hector, MD    Expected Discharge Date 10/25/19      Treadmill   MPH --    Grade --    Minutes --    METs --      Recumbant Bike   Level 2    Minutes 15    METs 2.3      NuStep   Level 2    SPM 85    Minutes 15    METs 2.3      Prescription Details   Frequency (times per week) 3    Duration Progress to 30 minutes of continuous aerobic without signs/symptoms of physical distress      Intensity   THRR 40-80% of Max Heartrate 68-136    Ratings of Perceived Exertion 11-13    Perceived Dyspnea 0-4      Progression   Progression Continue to progress workloads to maintain intensity without signs/symptoms of physical distress.      Resistance Training   Training Prescription Yes    Weight 4lbs    Reps 10-15           Discharge Exercise Prescription (Final Exercise Prescription Changes):  Exercise Prescription Changes - 11/01/19 0657      Response to Exercise   Blood Pressure (Admit) 98/62    Blood Pressure (Exercise) 112/82    Blood Pressure (Exit)  116/72    Heart Rate (Admit) 77 bpm    Heart Rate (Exercise) 105 bpm    Heart Rate (Exit) 83 bpm    Rating of Perceived Exertion (Exercise) 12    Symptoms none    Duration Continue with 30 min of aerobic exercise without signs/symptoms of physical distress.    Intensity THRR unchanged      Progression   Progression Continue to progress workloads to maintain intensity without signs/symptoms of physical distress.    Average METs 2.9      Resistance Training   Training Prescription No      Interval Training   Interval Training No      Recumbant Bike   Level 3    Minutes 15    METs 2.8      NuStep   Level 4    SPM 85    Minutes 15    METs 3.1      Home Exercise Plan   Plans to continue exercise at Home (comment)   walking, biking   Frequency Add 2 additional days to program exercise sessions.    Initial Home Exercises Provided 09/23/19  Functional Capacity:  6 Minute Walk    Row Name 08/27/19 0953 10/25/19 0657       6 Minute Walk   Phase Initial Discharge    Distance 1005 feet 1503 feet    Distance % Change -- 49.55 %    Distance Feet Change -- 498 ft    Walk Time 6 minutes  Stopped at 4 mins 49 sec due to SOB. 6 minutes  Stopped at 4 mins 49 sec due to SOB.    # of Rest Breaks 0 0    MPH 1.9 2.85    METS 3.34 4.12    RPE 13 12    Perceived Dyspnea  1.5 0    VO2 Peak 11.71 14.44    Symptoms Yes (comment) No    Comments Patient c/o mild SOB with walking. Patient unable to continue at 4 minutes 49 seconds secondary to SOB. --    Resting HR 89 bpm 83 bpm    Resting BP 98/62 102/62    Resting Oxygen Saturation  99 % --    Exercise Oxygen Saturation  during 6 min walk 99 % --    Max Ex. HR 96 bpm 96 bpm    Max Ex. BP 105/71 100/62    2 Minute Post BP 104/71 100/65           Psychological, QOL, Others - Outcomes: PHQ 2/9: Depression screen [facility] 2/9 08/27/2019 08/02/2019  Decreased Interest 0 0  Down, Depressed, Hopeless 0 0  PHQ - 2 Score 0 0     Quality of Life:  Quality of Life - 10/25/19 0953      Quality of Life   Select Quality of Life      Quality of Life Scores   Health/Function Pre 21.3 %    Health/Function Post 25.37 %    Health/Function % Change 19.11 %    Socioeconomic Pre 27.36 %    Socioeconomic Post 26.29 %    Socioeconomic % Change  -3.91 %    Psych/Spiritual Pre 28.29 %    Psych/Spiritual Post 29.14 %    Psych/Spiritual % Change 3 %    Family Pre 27 %    Family Post 27 %    Family % Change 0 %    GLOBAL Pre 24.76 %    GLOBAL Post 26.56 %    GLOBAL % Change 7.27 %           Personal Goals: Goals established at orientation with interventions provided to work toward goal.  Personal Goals and Risk Factors at Admission - 08/27/19 1049      Core Components/Risk Factors/Patient Goals on Admission   Diabetes Yes    Intervention Provide education about signs/symptoms and action to take for hypo/hyperglycemia.;Provide education about proper nutrition, including hydration, and aerobic/resistive exercise prescription along with prescribed medications to achieve blood glucose in normal ranges: Fasting glucose 65-99 mg/dL    Expected Outcomes Short Term: Participant verbalizes understanding of the signs/symptoms and immediate care of hyper/hypoglycemia, proper foot care and importance of medication, aerobic/resistive exercise and nutrition plan for blood glucose control.;Long Term: Attainment of HbA1C < 7%.    Hypertension Yes    Intervention Provide education on lifestyle modifcations including regular physical activity/exercise, weight management, moderate sodium restriction and increased consumption of fresh fruit, vegetables, and low fat dairy, alcohol moderation, and smoking cessation.;Monitor prescription use compliance.    Expected Outcomes Short Term: Continued assessment and intervention until BP is < 140/9m HG in hypertensive  participants. < 130/43m HG in hypertensive participants with diabetes, heart  failure or chronic kidney disease.;Long Term: Maintenance of blood pressure at goal levels.    Stress Yes    Intervention Offer individual and/or small group education and counseling on adjustment to heart disease, stress management and health-related lifestyle change. Teach and support self-help strategies.;Refer participants experiencing significant psychosocial distress to appropriate mental health specialists for further evaluation and treatment. When possible, include family members and significant others in education/counseling sessions.    Expected Outcomes Short Term: Participant demonstrates changes in health-related behavior, relaxation and other stress management skills, ability to obtain effective social support, and compliance with psychotropic medications if prescribed.;Long Term: Emotional wellbeing is indicated by absence of clinically significant psychosocial distress or social isolation.            Personal Goals Discharge:  Goals and Risk Factor Review    Row Name 09/02/19 0902 09/24/19 1326 10/22/19 1609 11/05/19 0739       Core Components/Risk Factors/Patient Goals Review   Personal Goals Review Diabetes;Stress;Hypertension Diabetes;Stress;Hypertension Diabetes;Stress;Hypertension Diabetes;Stress;Hypertension    Review Mr. FWitheringtonhas multiple CAD risk factors and is eager to participate in CR for modification. He has significantly decrease his A1C through diet and medications. His BP is well controlled. His goals are to get back to a "sence of normalcy", decrease his shortness of breath and fatigue with activities. He hopes to develop an exercise and healthy lifestyle routine he can maintain. Mr. FYarbrohas multiple CAD risk factors and is eager to participate in CR for modification. He has significantly decrease his A1C through diet and medications. His BP is well controlled. His goals are to get back to a "sence of normalcy", decrease his shortness of breath and fatigue with  activities. He hopes to develop an exercise and healthy lifestyle routine he can maintain. Mr. FCorneliohas multiple CAD risk factors and is eager to participate in CR for modification. He has changed is lifestyle to include a heart healthy diabetic diet and exercises daily. His stress levels have decreased related to having more confidence in maintaining his health. He is more confident with checking his blood sugars and understanding how foods and exercise impact his readings. --    Expected Outcomes Patient will continue to participate in CR for risk factor modification. Patient will continue to participate in CR for risk factor modification. Patient will continue to participate in CR for risk factor modification. Mr. FGwynnenow has the tools and confidence to manage his CAD risk factors. He will continue to exercise, take meds as prescribed, attend all medical appointments, and adhere to a heart healthy DM diet post discharge.           Exercise Goals and Review:  Exercise Goals    Row Name 08/27/19 0937             Exercise Goals   Increase Physical Activity Yes       Intervention Provide advice, education, support and counseling about physical activity/exercise needs.;Develop an individualized exercise prescription for aerobic and resistive training based on initial evaluation findings, risk stratification, comorbidities and participant's personal goals.       Expected Outcomes Short Term: Attend rehab on a regular basis to increase amount of physical activity.;Long Term: Exercising regularly at least 3-5 days a week.;Long Term: Add in home exercise to make exercise part of routine and to increase amount of physical activity.       Increase Strength and Stamina Yes  Intervention Provide advice, education, support and counseling about physical activity/exercise needs.;Develop an individualized exercise prescription for aerobic and resistive training based on initial evaluation findings,  risk stratification, comorbidities and participant's personal goals.       Expected Outcomes Short Term: Increase workloads from initial exercise prescription for resistance, speed, and METs.;Short Term: Perform resistance training exercises routinely during rehab and add in resistance training at home;Long Term: Improve cardiorespiratory fitness, muscular endurance and strength as measured by increased METs and functional capacity (6MWT)       Able to understand and use rate of perceived exertion (RPE) scale Yes       Intervention Provide education and explanation on how to use RPE scale       Expected Outcomes Short Term: Able to use RPE daily in rehab to express subjective intensity level;Long Term:  Able to use RPE to guide intensity level when exercising independently       Knowledge and understanding of Target Heart Rate Range (THRR) Yes       Intervention Provide education and explanation of THRR including how the numbers were predicted and where they are located for reference       Expected Outcomes Short Term: Able to state/look up THRR;Long Term: Able to use THRR to govern intensity when exercising independently;Short Term: Able to use daily as guideline for intensity in rehab       Able to check pulse independently Yes       Intervention Provide education and demonstration on how to check pulse in carotid and radial arteries.;Review the importance of being able to check your own pulse for safety during independent exercise       Expected Outcomes Short Term: Able to explain why pulse checking is important during independent exercise;Long Term: Able to check pulse independently and accurately       Understanding of Exercise Prescription Yes       Intervention Provide education, explanation, and written materials on patient's individual exercise prescription       Expected Outcomes Short Term: Able to explain program exercise prescription;Long Term: Able to explain home exercise prescription to  exercise independently              Exercise Goals Re-Evaluation:  Exercise Goals Re-Evaluation    Row Name 09/02/19 0817 09/23/19 0659 10/23/19 0658 10/25/19 0956 10/30/19 0755     Exercise Goal Re-Evaluation   Exercise Goals Review Increase Physical Activity;Able to understand and use rate of perceived exertion (RPE) scale Increase Physical Activity;Able to understand and use rate of perceived exertion (RPE) scale;Understanding of Exercise Prescription;Increase Strength and Stamina;Able to check pulse independently;Knowledge and understanding of Target Heart Rate Range (THRR) Increase Physical Activity;Able to understand and use rate of perceived exertion (RPE) scale;Understanding of Exercise Prescription;Increase Strength and Stamina;Able to check pulse independently;Knowledge and understanding of Target Heart Rate Range (THRR) Increase Physical Activity;Able to understand and use rate of perceived exertion (RPE) scale;Understanding of Exercise Prescription;Increase Strength and Stamina;Able to check pulse independently;Knowledge and understanding of Target Heart Rate Range (THRR) Increase Physical Activity;Able to understand and use rate of perceived exertion (RPE) scale;Understanding of Exercise Prescription;Increase Strength and Stamina;Able to check pulse independently;Knowledge and understanding of Target Heart Rate Range (THRR)   Comments Patient able to understand and use RPE scale appropriately. Reviewed home exercise guidelines with patient including endpoints, temperature precautions, target heart rate and rate of perceived exertion. Pt is walking and riding his bike as his mode of home exercise. Pt recently got back to  riding his bike and was able to ride ~5 miles in 60 minutes without symptoms or issues. Pt is also walking about 20 minutes without issues. Encouraged patient to walk or ride at least 30 minutes, 2 days/week in addition to exercise at cardiac rehab to achieve at least 150  minutes of aerobic exercise/week, and pt is amenable to this. Pt is able to count his pulse. Pt voices understanding of instructions given. Patient will complete the cardiac rehab program mext week. Patient recently purchased a stationary bike that he plans to use in addition to walking as his mode home exericse. Patient's goal is ride his bike everyday. Patient states that he's feeling better now than when he started the program. Patient is scheduled to complete the phase 2 cardiac rehab program next week. Patient has progressed well with exercise, achieving 3.1 METs with exercise. Patient's functional capacity increased 49% as measured by 6MWT. Patient completed the phase 2 cardiac rehab program and feels that he's benefitted from the program. Patient states that he does not get tired as easily and doesn't get as tired going up stairs now as he did prior to participation in the program. Patient purchased a stationary bike that he plans to use as his mode of home exercise.   Expected Outcomes Increase workloads as tolerated to help improve cardiorespiratory fitness. Patient will walk or cycle at least 30 minutes, 2 days/week in addition to exercise at cardiac rehab to help increase energy and stamina. Patient will walk or ride his bike daily to continue his exercise routine upon completion of the cardiac rehab program. Patient will continue daily exericse routine to help maintain health and fitness gains. Patient will continue his aerobic exercise routine walking or riding stationary bike 30 minutes 5-7 days/week to maintain health and fitness gains.          Nutrition & Weight - Outcomes:  Pre Biometrics - 08/27/19 0949      Pre Biometrics   Waist Circumference 36.75 inches    Hip Circumference 37.75 inches    Waist to Hip Ratio 0.97 %    Triceps Skinfold 14 mm    % Body Fat 24.6 %    Grip Strength 47.5 kg    Flexibility 10.5 in    Single Leg Stand 30 seconds           Post Biometrics -  10/30/19 0657       Post  Biometrics   Height 5' 6.25" (1.683 m)    Waist Circumference 39.25 inches    Hip Circumference 38.75 inches    Waist to Hip Ratio 1.01 %    BMI (Calculated) 26.48    Triceps Skinfold 15 mm    % Body Fat 26.4 %    Grip Strength 47 kg    Flexibility 10.5 in    Single Leg Stand 30 seconds           Nutrition:  Nutrition Therapy & Goals - 09/13/19 0830      Nutrition Therapy   Diet Heart Healthy/Carb modified      Personal Nutrition Goals   Nutrition Goal Pt to identify and limit food sources of saturated fat, trans fat, refined carbohydrates and sodium    Personal Goal #2 Pt to continue monitoring CBGs and keep within safe range    Personal Goal #3 Pt to decrease eating out to 2x/week      Intervention Plan   Intervention Prescribe, educate and counsel regarding individualized specific dietary modifications aiming towards  targeted core components such as weight, hypertension, lipid management, diabetes, heart failure and other comorbidities.;Nutrition handout(s) given to patient.   Label reading handout   Expected Outcomes Short Term Goal: A plan has been developed with personal nutrition goals set during dietitian appointment.;Long Term Goal: Adherence to prescribed nutrition plan.           Nutrition Discharge:  Nutrition Assessments - 11/04/19 1502      MEDFICTS Scores   Post Score 24           Education Questionnaire Score:  Knowledge Questionnaire Score - 10/25/19 0953      Knowledge Questionnaire Score   Pre Score 20/24    Post Score 23/24           Goals reviewed with patient; copy given to patient.

## 2019-10-30 NOTE — Patient Instructions (Signed)
  DIABETIC NERVE PAIN (intermittent)  - consider multivitamin, B-complex, alpha-lipoic acid supplements  - check B12 level

## 2019-10-30 NOTE — Progress Notes (Signed)
[facility]  PATIENT: Shawn Landry DOB: [birth year]  REFERRING CLINICIAN: Antony Contras, MD HISTORY FROM: patient  REASON FOR VISIT: new consult    HISTORICAL  CHIEF COMPLAINT:  Chief Complaint  Patient presents with  . Neuropathy    rm 7  New Pt "nerve pain (pinch) in legs and upper back arms"    HISTORY OF PRESENT ILLNESS:   49 year old male here for evaluation of lower extremity pain.  Since 2018 patient has had intermittent migratory numbness, burning sensation, needle sensation in his legs and feet.  Sometimes he feels this in his arms and hands.  In May 2021 he had severe chest pain was admitted to the hospital for myocardial infarction.  He had stent placement and was diagnosed with diabetes, hypercholesterolemia and heart disease.  Initial A1c was greater than 11.  Patient had not been to PCP in a while and therefore these were new diagnoses.  Since that time he has been on medical management and A1c is improved to 7.2.  Overall he is doing better.  He still has some intermittent numbness and pain sensations.  He notices these in his axillary region, arms and legs.  Sensation is last just a few seconds at a time.  No significant neck or low back pain.    REVIEW OF SYSTEMS: Full 14 system review of systems performed and negative with exception of: As per HPI.  ALLERGIES: No Known Allergies  HOME MEDICATIONS: Outpatient Medications Prior to Visit  Medication Sig Dispense Refill  . aspirin EC 81 MG tablet Take 81 mg by mouth daily.    Marland Kitchen atorvastatin (LIPITOR) 80 MG tablet Take 1 tablet (80 mg total) by mouth daily. 90 tablet 3  . blood glucose meter kit and supplies Dispense based on patient and insurance preference. Use up to four times daily as directed. (FOR ICD-10 E10.9, E11.9). 1 each 0  . Continuous Blood Gluc Sensor (FREESTYLE LIBRE 2 SENSOR) MISC Use every 14 days 2 each 1  . empagliflozin (JARDIANCE) 10 MG TABS tablet Take 1 tablet (10 mg  total) by mouth daily before breakfast. 30 tablet 3  . metFORMIN (GLUCOPHAGE) 500 MG tablet Take 1 tablet (500 mg total) by mouth 2 (two) times daily with a meal. 60 tablet 1  . metoprolol tartrate (LOPRESSOR) 50 MG tablet Take 1.5 tablets (75 mg total) by mouth 2 (two) times daily. 270 tablet 3  . nitroGLYCERIN (NITROSTAT) 0.4 MG SL tablet Place 1 tablet (0.4 mg total) under the tongue every 5 (five) minutes x 3 doses as needed for chest pain. 30 tablet 0  . pantoprazole (PROTONIX) 40 MG tablet Take 1 tablet (40 mg total) by mouth daily. 30 tablet 1  . ticagrelor (BRILINTA) 90 MG TABS tablet Take 1 tablet (90 mg total) by mouth 2 (two) times daily. 180 tablet 3   No facility-administered medications prior to visit.    PAST MEDICAL HISTORY: Past Medical History:  Diagnosis Date  . Anemia   . Anxiety   . Coronary artery disease   . Diabetes mellitus without complication ([place])    type 2  . GERD (gastroesophageal reflux disease)   . Heat intolerance   . High cholesterol   . History of heart attack   . Hyperlipidemia   . Neuropathy     PAST SURGICAL HISTORY: Past Surgical History:  Procedure Laterality Date  . CORONARY STENT INTERVENTION  07/09/2019  . CORONARY STENT INTERVENTION N/A 07/09/2019   Procedure: CORONARY STENT INTERVENTION;  Surgeon: Belva Crome, MD;  Location: [facility];  Service: Cardiovascular;  Laterality: N/A;  . LEFT HEART CATH AND CORONARY ANGIOGRAPHY N/A 07/09/2019   Procedure: LEFT HEART CATH AND CORONARY ANGIOGRAPHY;  Surgeon: Belva Crome, MD;  Location: [facility];  Service: Cardiovascular;  Laterality: N/A;  . ROTATOR CUFF REPAIR Right   . ROTATOR CUFF REPAIR Right 2010    FAMILY HISTORY: Family History  Problem Relation Age of Onset  . Sudden Cardiac Death Brother 23  . Breast cancer Mother     SOCIAL HISTORY: Social History   Socioeconomic History  . Marital status: Single    Spouse name: Not on file  . Number of children:  0  . Years of education: Not on file  . Highest education level: Bachelor's degree (e.g., BA, AB, BS)  Occupational History    Comment: Financial planner  Tobacco Use  . Smoking status: Former Smoker    Quit date: 2010    Years since quitting: 11.7  . Smokeless tobacco: Never Used  Vaping Use  . Vaping Use: Never used  Substance and Sexual Activity  . Alcohol use: Yes    Comment: rare  . Drug use: Never  . Sexual activity: Not on file  Other Topics Concern  . Not on file  Social History Narrative   Lives alone   Caffeine 1-2 c daily   Social Determinants of Health   Financial Resource Strain:   . Difficulty of Paying Living Expenses: Not on file  Food Insecurity:   . Worried About Charity fundraiser in the Last Year: Not on file  . Ran Out of Food in the Last Year: Not on file  Transportation Needs:   . Lack of Transportation (Medical): Not on file  . Lack of Transportation (Non-Medical): Not on file  Physical Activity:   . Days of Exercise per Week: Not on file  . Minutes of Exercise per Session: Not on file  Stress:   . Feeling of Stress : Not on file  Social Connections:   . Frequency of Communication with Friends and Family: Not on file  . Frequency of Social Gatherings with Friends and Family: Not on file  . Attends Religious Services: Not on file  . Active Member of Clubs or Organizations: Not on file  . Attends Archivist Meetings: Not on file  . Marital Status: Not on file  Intimate Partner Violence:   . Fear of Current or Ex-Partner: Not on file  . Emotionally Abused: Not on file  . Physically Abused: Not on file  . Sexually Abused: Not on file     PHYSICAL EXAM  GENERAL EXAM/CONSTITUTIONAL: Vitals:  Vitals:   10/30/19 0922  BP: 104/66  Pulse: 81  Weight: 165 lb (74.8 kg)  Height: _0  (1.676 m)     Body mass index is 26.63 kg/m. Wt Readings from Last 3 Encounters:  10/30/19 165 lb (74.8 kg)  09/13/19 158 lb (71.7 kg)   08/27/19 160 lb 0.9 oz (72.6 kg)     Patient is in no distress; well developed, nourished and groomed; neck is supple  CARDIOVASCULAR:  Examination of carotid arteries is normal; no carotid bruits  Regular rate and rhythm, no murmurs  Examination of peripheral vascular system by observation and palpation is normal  EYES:  Ophthalmoscopic exam of optic discs and posterior segments is normal; no papilledema or hemorrhages  No exam data present  MUSCULOSKELETAL:  Gait, strength, tone,  movements noted in Neurologic exam below  NEUROLOGIC: MENTAL STATUS:  No flowsheet data found.  awake, alert, oriented to person, place and time  recent and remote memory intact  normal attention and concentration  language fluent, comprehension intact, naming intact  fund of knowledge appropriate  CRANIAL NERVE:   2nd - no papilledema on fundoscopic exam  2nd, 3rd, 4th, 6th - pupils equal and reactive to light, visual fields full to confrontation, extraocular muscles intact, no nystagmus  5th - facial sensation symmetric  7th - facial strength symmetric  8th - hearing intact  9th - palate elevates symmetrically, uvula midline  11th - shoulder shrug symmetric  12th - tongue protrusion midline  MOTOR:   normal bulk and tone, full strength in the BUE, BLE  SENSORY:   normal and symmetric to light touch, pinprick, temperature, vibration  COORDINATION:   finger-nose-finger, fine finger movements normal  REFLEXES:   deep tendon reflexes present and symmetric  GAIT/STATION:   narrow based gait     DIAGNOSTIC DATA (LABS, IMAGING, TESTING) - I reviewed patient records, labs, notes, testing and imaging myself where available.  Lab Results  Component Value Date   WBC 9.7 07/11/2019   HGB 11.4 (L) 07/11/2019   HCT 37.7 (L) 07/11/2019   MCV 74.5 (L) 07/11/2019   PLT 291 07/11/2019      Component Value Date/Time   NA 135 07/11/2019 0710   K 3.7 07/11/2019  0710   CL 103 07/11/2019 0710   CO2 23 07/11/2019 0710   GLUCOSE 192 (H) 07/11/2019 0710   BUN 9 07/11/2019 0710   CREATININE 0.69 07/11/2019 0710   CALCIUM 8.4 (L) 07/11/2019 0710   PROT 6.8 08/30/2019 0907   ALBUMIN 4.1 08/30/2019 0907   AST 19 08/30/2019 0907   ALT 22 08/30/2019 0907   ALKPHOS 139 (H) 08/30/2019 0907   BILITOT 0.4 08/30/2019 0907   GFRNONAA >60 07/11/2019 0710   GFRAA >60 07/11/2019 0710   Lab Results  Component Value Date   CHOL 128 08/30/2019   HDL 39 (L) 08/30/2019   LDLCALC 66 08/30/2019   TRIG 127 08/30/2019   CHOLHDL 3.3 08/30/2019   Lab Results  Component Value Date   HGBA1C 11.3 (H) 07/09/2019   No results found for: VITAMINB12 No results found for: TSH     ASSESSMENT AND PLAN  49 y.o. year old male here with intermittent numbness, burning, needle pain, since 2018, likely related to diabetic neuropathy.  Dx:  1. Numbness   2. Neuropathic pain     PLAN:  DIABETIC NERVE PAIN (intermittent) - continue diabetes treatments, diet and exercise changes - consider multivitamin, B-complex, alpha-lipoic acid supplements - check B12 level  Orders Placed This Encounter  Procedures  . Vitamin B12    Return for pending if symptoms worsen or fail to improve, return to PCP.    Penni Bombard, MD 7/48/2707, 86:75 AM Certified in Neurology, Neurophysiology and Neuroimaging  [facility] [address] [place], [place] [address] [phone]

## 2019-10-31 ENCOUNTER — Encounter: Payer: Self-pay | Admitting: *Deleted

## 2019-10-31 LAB — VITAMIN B12: Vitamin B-12: 515 pg/mL (ref 232–1245)

## 2020-03-03 NOTE — Progress Notes (Signed)
Cardiology Office Note   Date:  03/11/2020   ID:  Shawn Landry, DOB Sep 14, 1970, MRN [id number]  PCP:  Antony Contras, MD  Cardiologist: Dr. Johnsie Cancel, MD   No chief complaint on file.   History of Present Illness: Shawn Landry is a 50 y.o. male has a history of hyperlipidemia and GERD who presented to [facility] on 07/09/2019 for the evaluation of chest pain.Troponin levels found to be elevated from 23>> 155>> 420. LHC  on 07/09/2019 with proximal OM stenosis with DES/PCI placement.  Also noted to have 50% mid LAD, moderate to severe proximal diagonal disease in 1 and 2 and proximal to mid RCA disease at 50% all of which are to be treated medically for now.  EF was noted to be 45 to 50% with follow-up echocardiogram showing EF at 50 to 55% with no valvular disease.  He has been compliant with his medications including ASA and Brilinta.  Denies chest pain, shortness of breath, LE edema, orthopnea, PND, dizziness or syncope. Some peripheral neuropathy from DM   LDL at goal 66 on statin BS control much better   Has taken nitro on occasion for fleeting pains in chest No prolonged pain or need for more than one nitro Has some histamine release symptoms around abdomen with activity Also with some LE neuropathy from  DM in legs   Discussed not giving blood until June when off Brilinta   Past Medical History:  Diagnosis Date  . Anemia   . Anxiety   . Coronary artery disease   . Diabetes mellitus without complication ([place])    type 2  . GERD (gastroesophageal reflux disease)   . Heat intolerance   . High cholesterol   . History of heart attack   . Hyperlipidemia   . Neuropathy     Past Surgical History:  Procedure Laterality Date  . CORONARY STENT INTERVENTION  07/09/2019  . CORONARY STENT INTERVENTION N/A 07/09/2019   Procedure: CORONARY STENT INTERVENTION;  Surgeon: Belva Crome, MD;  Location: [facility];  Service: Cardiovascular;  Laterality: N/A;  . LEFT HEART CATH AND  CORONARY ANGIOGRAPHY N/A 07/09/2019   Procedure: LEFT HEART CATH AND CORONARY ANGIOGRAPHY;  Surgeon: Belva Crome, MD;  Location: [facility];  Service: Cardiovascular;  Laterality: N/A;  . ROTATOR CUFF REPAIR Right   . ROTATOR CUFF REPAIR Right 2010     Current Outpatient Medications  Medication Sig Dispense Refill  . aspirin EC 81 MG tablet Take 81 mg by mouth daily.    Marland Kitchen atorvastatin (LIPITOR) 80 MG tablet Take 1 tablet (80 mg total) by mouth daily. 90 tablet 3  . blood glucose meter kit and supplies Dispense based on patient and insurance preference. Use up to four times daily as directed. (FOR ICD-10 E10.9, E11.9). 1 each 0  . Continuous Blood Gluc Sensor (FREESTYLE LIBRE 2 SENSOR) MISC Use every 14 days 2 each 1  . empagliflozin (JARDIANCE) 10 MG TABS tablet Take 1 tablet (10 mg total) by mouth daily before breakfast. 30 tablet 3  . metFORMIN (GLUCOPHAGE) 500 MG tablet Take 1 tablet (500 mg total) by mouth 2 (two) times daily with a meal. 60 tablet 1  . metoprolol tartrate (LOPRESSOR) 50 MG tablet Take 1.5 tablets (75 mg total) by mouth 2 (two) times daily. 270 tablet 3  . nitroGLYCERIN (NITROSTAT) 0.4 MG SL tablet Place 1 tablet (0.4 mg total) under the tongue every 5 (five) minutes x 3 doses  as needed for chest pain. 30 tablet 0  . pantoprazole (PROTONIX) 40 MG tablet Take 1 tablet (40 mg total) by mouth daily. 30 tablet 1  . ticagrelor (BRILINTA) 90 MG TABS tablet Take 1 tablet (90 mg total) by mouth 2 (two) times daily. 180 tablet 3   No current facility-administered medications for this visit.    Allergies:   Patient has no known allergies.    Social History:  The patient  reports that he quit smoking about 12 years ago. He has never used smokeless tobacco. He reports current alcohol use. He reports that he does not use drugs.   Family History:  The patient's family history includes Breast cancer in his mother; Sudden Cardiac Death (age of onset: 67) in his brother.     ROS:  Please see the history of present illness.   Otherwise, review of systems are positive for none. All other systems are reviewed and negative.    PHYSICAL EXAM: VS:  BP (!) 100/56   Pulse 96   Ht 5' 5.5" (1.664 m)   Wt 76.2 kg   SpO2 98%   BMI 27.53 kg/m  , BMI Body mass index is 27.53 kg/m.   Affect appropriate Healthy:  appears stated age 81: normal Neck supple with no adenopathy JVP normal no bruits no thyromegaly Lungs clear with no wheezing and good diaphragmatic motion Heart:  S1/S2 no murmur, no rub, gallop or click PMI normal Abdomen: benighn, BS positve, no tenderness, no AAA no bruit.  No HSM or HJR Distal pulses intact with no bruits No edema Neuro non-focal Skin warm and dry No muscular weakness   EKG:   SR insignificant lateral Q waves 07/12/19  Recent Labs: 07/11/2019: BUN 9; Creatinine, Ser 0.69; Hemoglobin 11.4; Platelets 291; Potassium 3.7; Sodium 135 08/30/2019: ALT 22    Lipid Panel    Component Value Date/Time   CHOL 128 08/30/2019 0907   TRIG 127 08/30/2019 0907   HDL 39 (L) 08/30/2019 0907   CHOLHDL 3.3 08/30/2019 0907   CHOLHDL 5.5 07/09/2019 0520   VLDL 15 07/09/2019 0520   LDLCALC 66 08/30/2019 0907    Wt Readings from Last 3 Encounters:  03/11/20 76.2 kg  10/30/19 75 kg  10/30/19 74.8 kg    Other studies Reviewed: Additional studies/ records that were reviewed today include: Review of the above records demonstrates:  [facility] 07/09/2019:   A stent was successfully placed.    Non-ST elevation myocardial infarction, late presenting, involving the proximal portion of the large obtuse marginal that supplies the lateral and inferoapical wall.  Successful PCI with stent implantation reducing 100% stenosis with TIMI grade 0 flow to 0% stenosis and TIMI grade III flow.  Patent left main   50% mid LAD disease and moderately severe proximal diagonal 1 and diagonal 2 disease (better treated with medication).  Diffuse luminal  irregularities from proximal to distal RCA with proximal to mid 50 % narrowing.  Inferoapical moderate hypokinesis.  EF 45 to 50%.  LVEDP 17 mmHg.  RECOMMENDATIONS:   Aspirin and Brilinta x12 months  Aggressive risk factor modification including 50% reduction in LDL with target less than 70.  Aerobic exercise and consideration of cardiac rehab.  Hemoglobin A1c.  Continue to cycle cardiac markers.  Echocardiogram 07/09/2019:  1. Left ventricular ejection fraction, by estimation, is 50 to 55%. The  left ventricle has low normal function. The left ventricle demonstrates  regional wall motion abnormalities (see scoring diagram/findings for  description). Left ventricular diastolic  parameters were normal.  2. Right ventricular systolic function is normal. The right ventricular  size is normal. Tricuspid regurgitation signal is inadequate for assessing  PA pressure.  3. The mitral valve is grossly normal. No evidence of mitral valve  regurgitation. No evidence of mitral stenosis.  4. The aortic valve is tricuspid. Aortic valve regurgitation is not  visualized. No aortic stenosis is present.  5. The inferior vena cava is dilated in size with >50% respiratory  variability, suggesting right atrial pressure of 8 mmHg.   ASSESSMENT AND PLAN:  1. CAD s/p NSTEMI: -LHC 07/09/19  with 100% stenosis in the proximal OM s/p DES/PCI.  Also noted to have 50% mid LAD stenosis, moderate to severe proximal diagonal disease in 1 and 2 as well as proximal to mid RCA stenosis at 50%>> to treat medically -Plan is for DAPT with ASA and Brilinta May 2022  tolerating well without signs or symptoms of hematoma or bleeding -Continue metoprolol, aspirin, Brilinta, statin -Echocardiogram EF 56-70%, LV diastolic parameters were normal -continue with cardiac rehab   2. New onset diabetes mellitus, type IIwith hyperglycemia: -Hemoglobin A1c 11.3, no prior for comparison -Patient discharged on  Jardiance and Metformin>> needs follow-up with PCP Last A1c improved 7.2   3. Hyperlipidemia -Lipidpanel shows total cholesterol 265, HDL 48, LDL 202, triglycerides 74 -Continue statin>> on RX LDL 66 labs done 08/30/19   4. GERD -Continue PPI  5. Neuropathy:  F/u neurology related to diabetes    Current medicines are reviewed at length with the patient today.  The patient does not have concerns regarding medicines.  The following changes have been made:  no change  Labs/ tests ordered today include: none  No orders of the defined types were placed in this encounter.   Disposition:   FU with me in 6 months   Signed, Jenkins Rouge, MD  03/11/2020 9:45 AM    [facility] [place], [place], [place]  [address] Phone: [phone]; Fax: [phone]

## 2020-03-11 ENCOUNTER — Other Ambulatory Visit: Payer: Self-pay

## 2020-03-11 ENCOUNTER — Telehealth: Payer: Self-pay | Admitting: Cardiovascular Disease

## 2020-03-11 ENCOUNTER — Ambulatory Visit (INDEPENDENT_AMBULATORY_CARE_PROVIDER_SITE_OTHER): Payer: BC Managed Care – PPO | Admitting: Cardiovascular Disease

## 2020-03-11 ENCOUNTER — Encounter: Payer: Self-pay | Admitting: Cardiovascular Disease

## 2020-03-11 VITALS — BP 100/56 | HR 96 | Ht 65.5 in | Wt 168.0 lb

## 2020-03-11 DIAGNOSIS — I2510 Atherosclerotic heart disease of native coronary artery without angina pectoris: Secondary | ICD-10-CM | POA: Diagnosis not present

## 2020-03-11 DIAGNOSIS — E1040 Type 1 diabetes mellitus with diabetic neuropathy, unspecified: Secondary | ICD-10-CM

## 2020-03-11 MED ORDER — NITROGLYCERIN 0.4 MG SL SUBL: 0.4000 mg | SUBLINGUAL_TABLET | SUBLINGUAL | 3 refills | Status: DC | PRN

## 2020-03-11 NOTE — Patient Instructions (Addendum)
Medication Instructions:  *If you need a refill on your cardiac medications before your next appointment, please call your pharmacy*  Lab Work: If you have labs (blood work) drawn today and your tests are completely normal, you will receive your results only by: Marland Kitchen MyChart Message (if you have MyChart) OR . A paper copy in the mail If you have any lab test that is abnormal or we need to change your treatment, we will call you to review the results.  Testing/Procedures: None ordered today.   Follow-Up: At [facility], you and your health needs are our priority.  As part of our continuing mission to provide you with exceptional heart care, we have created designated Provider Care Teams.  These Care Teams include your primary Cardiologist (physician) and Advanced Practice Providers (APPs -  Physician Assistants and Nurse Practitioners) who all work together to provide you with the care you need, when you need it.  We recommend signing up for the patient portal called "MyChart".  Sign up information is provided on this After Visit Summary.  MyChart is used to connect with patients for Virtual Visits (Telemedicine).  Patients are able to view lab/test results, encounter notes, upcoming appointments, etc.  Non-urgent messages can be sent to your provider as well.   To learn more about what you can do with MyChart, go to [email or web address].    Your next appointment:   In May on 06/15/20 at 9:45 am  The format for your next appointment:   In Person  Provider:   You may see Dr. Eden Emms or one of the following Advanced Practice Providers on your designated Care Team:    Georgie Chard, NP

## 2020-03-11 NOTE — Telephone Encounter (Signed)
Patient stated that on his visit today it showed type 1 diabetes mellitus (DM) for a diagnosis and he wanted to make sure this was changed to type 2 DM, that he does not have type 1 DM. Informed patient that we could correct the office visit diagnosis to reflect the right DM. Will forward to Dr. Eden Emms for help.

## 2020-03-16 NOTE — Telephone Encounter (Signed)
Patient calling back. He states that the diagnosis needs to be changed as soon as possible. He would like a call back when this is changed.

## 2020-03-17 NOTE — Telephone Encounter (Signed)
Patient following up. States he is still showing it says he has type 1 diabetes and he needs this changed ASAP. States he will call back later today to check on the status of it.

## 2020-03-20 NOTE — Telephone Encounter (Signed)
Diagnosis has been taken out of his office visit.

## 2020-03-23 NOTE — Telephone Encounter (Signed)
    Pt is upset, he said he still seeing the diagnosis on his mychart. Advised last Friday per Pam's notes diagnosis has been remove. Pt is adamant he said he still sees it on his mychart at [facility] 01/26. Pt wants to get a callback today. He is upset he said his been missing work because of this and will take legal action if he did not get any update today.

## 2020-03-23 NOTE — Telephone Encounter (Signed)
Called and spoke to patient. Made him aware that Dr. Eden Emms has removed Type I DM from his record. Patient states that it was still showing on his AVS from 1/26 in MyChart. I went in and reprinted AVS. Patient states that it is not showing anymore. Patient denies having any other concerns at this time and appreciated the call.

## 2020-06-01 NOTE — Progress Notes (Signed)
Cardiology Office Note   Date:  06/15/2020   ID:  Shawn Landry, DOB 01-Jun-1970, MRN [id number]  PCP:  Antony Contras, MD  Cardiologist: Dr. Johnsie Cancel, MD   No chief complaint on file.   History of Present Illness: Shawn Landry is a 50 y.o. male has a history of hyperlipidemia and GERD who presented to [facility] on 07/09/2019 for the evaluation of chest pain.Troponin levels found to be elevated from 23>> 155>> 420. LHC  on 07/09/2019 with proximal OM stenosis with DES/PCI placement.  Also noted to have 50% mid LAD, moderate to severe proximal diagonal disease in 1 and 2 and proximal to mid RCA disease at 50% all of which are to be treated medically for now. Follow-up echocardiogram 07/10/19  showing EF  at 50 to 55% with no valvular disease.  He has been compliant with his medications including ASA and Brilinta.  Denies chest pain, shortness of breath, LE edema, orthopnea, PND, dizziness or syncope. Some peripheral neuropathy from DM   LDL at goal 66 on statin BS control much better   Has iron deficiency anemia having colonoscopy Friday  Will d/c Brilinta as stent was a year ago and he needs colon  Past Medical History:  Diagnosis Date  . Anemia   . Anxiety   . Coronary artery disease   . Diabetes mellitus without complication ([place])    type 2  . GERD (gastroesophageal reflux disease)   . Heat intolerance   . High cholesterol   . History of heart attack   . Hyperlipidemia   . Neuropathy     Past Surgical History:  Procedure Laterality Date  . CORONARY STENT INTERVENTION  07/09/2019  . CORONARY STENT INTERVENTION N/A 07/09/2019   Procedure: CORONARY STENT INTERVENTION;  Surgeon: Belva Crome, MD;  Location: [place] [facility];  Service: Cardiovascular;  Laterality: N/A;  . LEFT HEART CATH AND CORONARY ANGIOGRAPHY N/A 07/09/2019   Procedure: LEFT HEART CATH AND CORONARY ANGIOGRAPHY;  Surgeon: Belva Crome, MD;  Location: [place] [facility];  Service: Cardiovascular;  Laterality:  N/A;  . ROTATOR CUFF REPAIR Right   . ROTATOR CUFF REPAIR Right 2010     Current Outpatient Medications  Medication Sig Dispense Refill  . aspirin EC 81 MG tablet Take 81 mg by mouth daily.    Marland Kitchen atorvastatin (LIPITOR) 80 MG tablet Take 1 tablet (80 mg total) by mouth daily. 90 tablet 3  . blood glucose meter kit and supplies Dispense based on patient and insurance preference. Use up to four times daily as directed. (FOR ICD-10 E10.9, E11.9). 1 each 0  . Continuous Blood Gluc Sensor (FREESTYLE LIBRE 2 SENSOR) MISC Use every 14 days 2 each 1  . empagliflozin (JARDIANCE) 10 MG TABS tablet Take 1 tablet (10 mg total) by mouth daily before breakfast. 30 tablet 3  . metFORMIN (GLUCOPHAGE) 500 MG tablet Take 1 tablet (500 mg total) by mouth 2 (two) times daily with a meal. 60 tablet 1  . metoprolol tartrate (LOPRESSOR) 50 MG tablet Take 1.5 tablets (75 mg total) by mouth 2 (two) times daily. 270 tablet 3  . nitroGLYCERIN (NITROSTAT) 0.4 MG SL tablet Place 1 tablet (0.4 mg total) under the tongue every 5 (five) minutes x 3 doses as needed for chest pain. 25 tablet 3  . pantoprazole (PROTONIX) 40 MG tablet Take 1 tablet (40 mg total) by mouth daily. 30 tablet 1  . ticagrelor (BRILINTA) 90 MG TABS tablet Take 1  tablet (90 mg total) by mouth 2 (two) times daily. 180 tablet 3   No current facility-administered medications for this visit.    Allergies:   Patient has no known allergies.    Social History:  The patient  reports that he quit smoking about 12 years ago. He has never used smokeless tobacco. He reports current alcohol use. He reports that he does not use drugs.   Family History:  The patient's family history includes Breast cancer in his mother; Sudden Cardiac Death (age of onset: 53) in his brother.    ROS:  Please see the history of present illness.   Otherwise, review of systems are positive for none. All other systems are reviewed and negative.    PHYSICAL EXAM: VS:  BP 104/82    Pulse 77   Ht 5' 5.5" (1.664 m)   Wt 78.5 kg   SpO2 98%   BMI 28.35 kg/m  , BMI Body mass index is 28.35 kg/m.   Affect appropriate Healthy:  appears stated age 75: normal Neck supple with no adenopathy JVP normal no bruits no thyromegaly Lungs clear with no wheezing and good diaphragmatic motion Heart:  S1/S2 no murmur, no rub, gallop or click PMI normal Abdomen: benighn, BS positve, no tenderness, no AAA no bruit.  No HSM or HJR Distal pulses intact with no bruits No edema Neuro non-focal Skin warm and dry No muscular weakness   EKG:   06/15/2020 SR rate 26 old IMI   Recent Labs: 07/11/2019: BUN 9; Creatinine, Ser 0.69; Hemoglobin 11.4; Platelets 291; Potassium 3.7; Sodium 135 08/30/2019: ALT 22    Lipid Panel    Component Value Date/Time   CHOL 128 08/30/2019 0907   TRIG 127 08/30/2019 0907   HDL 39 (L) 08/30/2019 0907   CHOLHDL 3.3 08/30/2019 0907   CHOLHDL 5.5 07/09/2019 0520   VLDL 15 07/09/2019 0520   LDLCALC 66 08/30/2019 0907    Wt Readings from Last 3 Encounters:  06/15/20 78.5 kg  03/11/20 76.2 kg  10/30/19 75 kg    Other studies Reviewed: Additional studies/ records that were reviewed today include: Review of the above records demonstrates:  [facility] 07/09/2019:   A stent was successfully placed.    Non-ST elevation myocardial infarction, late presenting, involving the proximal portion of the large obtuse marginal that supplies the lateral and inferoapical wall.  Successful PCI with stent implantation reducing 100% stenosis with TIMI grade 0 flow to 0% stenosis and TIMI grade III flow.  Patent left main   50% mid LAD disease and moderately severe proximal diagonal 1 and diagonal 2 disease (better treated with medication).  Diffuse luminal irregularities from proximal to distal RCA with proximal to mid 50 % narrowing.  Inferoapical moderate hypokinesis.  EF 45 to 50%.  LVEDP 17 mmHg.  RECOMMENDATIONS:   Aspirin and Brilinta x12  months  Aggressive risk factor modification including 50% reduction in LDL with target less than 70.  Aerobic exercise and consideration of cardiac rehab.  Hemoglobin A1c.  Continue to cycle cardiac markers.  Echocardiogram 07/09/2019:  1. Left ventricular ejection fraction, by estimation, is 50 to 55%. The  left ventricle has low normal function. The left ventricle demonstrates  regional wall motion abnormalities (see scoring diagram/findings for  description). Left ventricular diastolic  parameters were normal.  2. Right ventricular systolic function is normal. The right ventricular  size is normal. Tricuspid regurgitation signal is inadequate for assessing  PA pressure.  3. The mitral valve is grossly normal.  No evidence of mitral valve  regurgitation. No evidence of mitral stenosis.  4. The aortic valve is tricuspid. Aortic valve regurgitation is not  visualized. No aortic stenosis is present.  5. The inferior vena cava is dilated in size with >50% respiratory  variability, suggesting right atrial pressure of 8 mmHg.   ASSESSMENT AND PLAN:  1. CAD s/p NSTEMI: -LHC 07/09/19  with 100% stenosis in the proximal OM s/p DES/PCI.  Also noted to have 50% mid LAD stenosis, moderate to severe proximal diagonal disease in 1 and 2 as well as proximal to mid RCA stenosis at 50%>> to treat medically -Plan is for DAPT with ASA and Brilinta May 2022  tolerating well without signs or symptoms of hematoma or bleeding -Continue metoprolol, aspirin, Brilinta, statin -Echocardiogram EF 02-33%, LV diastolic parameters were normal -continue with cardiac rehab   2. New onset diabetes mellitus, type IIwith hyperglycemia: -Hemoglobin A1c 11.3, no prior for comparison -Patient discharged on Jardiance and Metformin>> needs follow-up with PCP Last A1c improved 7.2   3. Hyperlipidemia -Continue statin>> on RX LDL 66 labs done 08/30/19   4. GERD -Continue PPI  5. Neuropathy:  F/u neurology  related to diabetes    Current medicines are reviewed at length with the patient today.  The patient does not have concerns regarding medicines.  The following changes have been made:  no change  Labs/ tests ordered today include: none  No orders of the defined types were placed in this encounter.   Disposition:   FU with me in 6 months   Signed, Jenkins Rouge, MD  06/15/2020 9:41 AM    [facility] [place], [place], [place]  [address] Phone: [phone]; Fax: [phone]

## 2020-06-15 ENCOUNTER — Encounter: Payer: Self-pay | Admitting: Cardiovascular Disease

## 2020-06-15 ENCOUNTER — Other Ambulatory Visit: Payer: Self-pay

## 2020-06-15 ENCOUNTER — Ambulatory Visit: Payer: BC Managed Care – PPO | Admitting: Cardiovascular Disease

## 2020-06-15 VITALS — BP 104/82 | HR 77 | Ht 65.5 in | Wt 173.0 lb

## 2020-06-15 DIAGNOSIS — Z9861 Coronary angioplasty status: Secondary | ICD-10-CM | POA: Diagnosis not present

## 2020-06-15 DIAGNOSIS — I2510 Atherosclerotic heart disease of native coronary artery without angina pectoris: Secondary | ICD-10-CM | POA: Diagnosis not present

## 2020-06-15 DIAGNOSIS — I249 Acute ischemic heart disease, unspecified: Secondary | ICD-10-CM | POA: Diagnosis not present

## 2020-06-15 NOTE — Patient Instructions (Addendum)
Medication Instructions:  STOP BRIILINTA   *If you need a refill on your cardiac medications before your next appointment, please call your pharmacy*   Lab Work: NONE If you have labs (blood work) drawn today and your tests are completely normal, you will receive your results only by: Marland Kitchen MyChart Message (if you have MyChart) OR . A paper copy in the mail If you have any lab test that is abnormal or we need to change your treatment, we will call you to review the results.   Testing/Procedures: NONE   Follow-Up: At [facility], you and your health needs are our priority.  As part of our continuing mission to provide you with exceptional heart care, we have created designated Provider Care Teams.  These Care Teams include your primary Cardiologist (physician) and Advanced Practice Providers (APPs -  Physician Assistants and Nurse Practitioners) who all work together to provide you with the care you need, when you need it.  We recommend signing up for the patient portal called "MyChart".  Sign up information is provided on this After Visit Summary.  MyChart is used to connect with patients for Virtual Visits (Telemedicine).  Patients are able to view lab/test results, encounter notes, upcoming appointments, etc.  Non-urgent messages can be sent to your provider as well.   To learn more about what you can do with MyChart, go to [email or web address].    Your next appointment:   12 month(s)  The format for your next appointment:   In Person  Provider:   Charlton Haws, MD   Other Instructions NONE

## 2020-08-12 ENCOUNTER — Telehealth: Payer: Self-pay | Admitting: Physician Assistant

## 2020-08-12 NOTE — Telephone Encounter (Signed)
Received a new hem referral from Quentin Mulling for IDA. Pt has been cld and scheduled to see Karena Addison on 7/13 at 9am. Pt aware to arrive 20 minutes early.

## 2020-08-20 ENCOUNTER — Other Ambulatory Visit: Payer: Self-pay | Admitting: Cardiology

## 2020-08-26 ENCOUNTER — Inpatient Hospital Stay: Payer: BC Managed Care – PPO | Attending: Physician Assistant

## 2020-08-26 ENCOUNTER — Inpatient Hospital Stay: Payer: BC Managed Care – PPO | Admitting: Physician Assistant

## 2020-08-26 ENCOUNTER — Encounter: Payer: Self-pay | Admitting: Physician Assistant

## 2020-08-26 ENCOUNTER — Other Ambulatory Visit: Payer: Self-pay

## 2020-08-26 VITALS — BP 116/79 | HR 78 | Temp 98.0°F | Resp 18 | Ht 65.5 in | Wt 177.3 lb

## 2020-08-26 DIAGNOSIS — D508 Other iron deficiency anemias: Secondary | ICD-10-CM

## 2020-08-26 DIAGNOSIS — D509 Iron deficiency anemia, unspecified: Secondary | ICD-10-CM | POA: Insufficient documentation

## 2020-08-26 LAB — CBC WITH DIFFERENTIAL (CANCER CENTER ONLY)
Abs Immature Granulocytes: 0.02 10*3/uL (ref 0.00–0.07)
Basophils Absolute: 0 10*3/uL (ref 0.0–0.1)
Basophils Relative: 0 %
Eosinophils Absolute: 0.2 10*3/uL (ref 0.0–0.5)
Eosinophils Relative: 3 %
HCT: 43.1 % (ref 39.0–52.0)
Hemoglobin: 12.7 g/dL — ABNORMAL LOW (ref 13.0–17.0)
Immature Granulocytes: 0 %
Lymphocytes Relative: 21 %
Lymphs Abs: 1.6 10*3/uL (ref 0.7–4.0)
MCH: 20.7 pg — ABNORMAL LOW (ref 26.0–34.0)
MCHC: 29.5 g/dL — ABNORMAL LOW (ref 30.0–36.0)
MCV: 70.2 fL — ABNORMAL LOW (ref 80.0–100.0)
Monocytes Absolute: 0.7 10*3/uL (ref 0.1–1.0)
Monocytes Relative: 9 %
Neutro Abs: 5 10*3/uL (ref 1.7–7.7)
Neutrophils Relative %: 67 %
Platelet Count: 251 10*3/uL (ref 150–400)
RBC: 6.14 MIL/uL — ABNORMAL HIGH (ref 4.22–5.81)
RDW: 20.8 % — ABNORMAL HIGH (ref 11.5–15.5)
WBC Count: 7.5 10*3/uL (ref 4.0–10.5)
nRBC: 0 % (ref 0.0–0.2)

## 2020-08-26 LAB — CMP (CANCER CENTER ONLY)
ALT: 15 U/L (ref 0–44)
AST: 17 U/L (ref 15–41)
Albumin: 3.7 g/dL (ref 3.5–5.0)
Alkaline Phosphatase: 102 U/L (ref 38–126)
Anion gap: 9 (ref 5–15)
BUN: 12 mg/dL (ref 6–20)
CO2: 27 mmol/L (ref 22–32)
Calcium: 8.9 mg/dL (ref 8.9–10.3)
Chloride: 105 mmol/L (ref 98–111)
Creatinine: 0.8 mg/dL (ref 0.61–1.24)
GFR, Estimated: >60 mL/min (ref >60)
Glucose, Bld: 124 mg/dL — ABNORMAL HIGH (ref 70–99)
Potassium: 4.2 mmol/L (ref 3.5–5.1)
Sodium: 141 mmol/L (ref 135–145)
Total Bilirubin: 0.5 mg/dL (ref 0.3–1.2)
Total Protein: 7.4 g/dL (ref 6.5–8.1)

## 2020-08-26 LAB — IRON AND TIBC
Iron: 21 ug/dL — ABNORMAL LOW (ref 42–163)
Saturation Ratios: 5 % — ABNORMAL LOW (ref 20–55)
TIBC: 439 ug/dL — ABNORMAL HIGH (ref 202–409)
UIBC: 418 ug/dL — ABNORMAL HIGH (ref 117–376)

## 2020-08-26 LAB — RETIC PANEL
Immature Retic Fract: 24.2 % — ABNORMAL HIGH (ref 2.3–15.9)
RBC.: 6.24 MIL/uL — ABNORMAL HIGH (ref 4.22–5.81)
Retic Count, Absolute: 68.6 10*3/uL (ref 19.0–186.0)
Retic Ct Pct: 1.1 % (ref 0.4–3.1)
Reticulocyte Hemoglobin: 20.4 pg — ABNORMAL LOW (ref >27.9)

## 2020-08-26 LAB — FERRITIN: Ferritin: 34 ng/mL (ref 24–336)

## 2020-08-26 NOTE — Progress Notes (Signed)
Opened in error

## 2020-08-26 NOTE — Progress Notes (Signed)
Shawn [phone]   Fax:([phone]) [place] NOTE  Patient Care Team: Antony Contras, MD as PCP - General (Family Medicine)  Hematological/Oncological History 1) Labs from [facility] PA-[facility], [facility] -04/17/2020: WBC 11.2 (H), Hgb 9.8 (L), MCV 59.0, Plt 322. Started on iron supplementation.  -06/02/2020: WBC 7.2, Hgb 11.2 (L), MCV 61.5 (L), Plt 247,  -06/18/2020: WBC 9.8, Hgb 11.0 (L), MCV 62.0 (L), Plt 265, Ferritin 4.8 (L), TIBC 524 (H), Iron 17 (L), Iron saturation 3% (L), Transferrin 374 (H) -08/04/2020: WBC 7.4, Hgb 12.5 (L), MCV 66.4 (L), Plt 238, Ferritin 12.1 (L), TIBC 484 (H), Iron 20 (L), Iron saturation 4% (L), Transferrin 346 (H)  2) 08/26/2020: Establish care with Dede Query PA-C  CHIEF COMPLAINTS/PURPOSE OF CONSULTATION:  "Iron deficiency anemia "  HISTORY OF PRESENTING ILLNESS:  Shawn Landry 50 y.o. male with medical history significant for type 2 diabetes, GERD, hyperlipidemia and coronary artery disease status will history of MI in May 2021 status post stent placement.  Patient is unaccompanied for this visit.  Patient reports that his energy levels are overall stable.  He does have intermittent episodes all his ADLs on his own.  He has a good appetite without any dietary restrictions.  Patient notes that he eats meat into his diet and eats red meat 2-3 times a month.  He denies any nausea, vomiting or abdominal pain.  He has regular bowel movements although complains of intermittent episodes of blood in the stool.  Patient recently underwent colonoscopy and endoscopy last week.  He reports that he was told there is no evidence of malignancy but he does have internal hemorrhoids.  Patient denies easy bruising or other signs of bleeding.  Patient does not have any unusual cravings at this time.  He denies any fevers, chills, night sweats, shortness of breath, chest pain or cough.  He has no other complaints.  Rest of the 10 point ROS  is below.   MEDICAL HISTORY:  Past Medical History:  Diagnosis Date   Anemia    Anxiety    Coronary artery disease    Diabetes mellitus without complication (HCC)    type 2   GERD (gastroesophageal reflux disease)    Heat intolerance    High cholesterol    History of heart attack    Hyperlipidemia    Neuropathy     SURGICAL HISTORY: Past Surgical History:  Procedure Laterality Date   CORONARY STENT INTERVENTION  07/09/2019   CORONARY STENT INTERVENTION N/A 07/09/2019   Procedure: CORONARY STENT INTERVENTION;  Surgeon: Belva Crome, MD;  Location: [facility];  Service: Cardiovascular;  Laterality: N/A;   LEFT HEART CATH AND CORONARY ANGIOGRAPHY N/A 07/09/2019   Procedure: LEFT HEART CATH AND CORONARY ANGIOGRAPHY;  Surgeon: Belva Crome, MD;  Location: [facility];  Service: Cardiovascular;  Laterality: N/A;   ROTATOR CUFF REPAIR Right    ROTATOR CUFF REPAIR Right 2010    SOCIAL HISTORY: Social History   Socioeconomic History   Marital status: Single    Spouse name: Not on file   Number of children: 0   Years of education: Not on file   Highest education level: Bachelor's degree (e.g., BA, AB, BS)  Occupational History    Comment: Financial planner  Tobacco Use   Smoking status: Former    Pack years: 0.00    Types: Cigarettes    Quit date: 2010    Years since quitting: 12.5  Smokeless tobacco: Never  Vaping Use   Vaping Use: Never used  Substance and Sexual Activity   Alcohol use: Yes    Comment: rare   Drug use: Never   Sexual activity: Not on file  Other Topics Concern   Not on file  Social History Narrative   Lives alone   Caffeine 1-2 c daily   Social Determinants of Health   Financial Resource Strain: Not on file  Food Insecurity: Not on file  Transportation Needs: Not on file  Physical Activity: Not on file  Stress: Not on file  Social Connections: Not on file  Intimate Partner Violence: Not on file    FAMILY  HISTORY: Family History  Problem Relation Age of Onset   Sudden Cardiac Death Brother 76   Breast cancer Mother     ALLERGIES:  has No Known Allergies.  MEDICATIONS:  Current Outpatient Medications  Medication Sig Dispense Refill   aspirin EC 81 MG tablet Take 81 mg by mouth daily.     atorvastatin (LIPITOR) 80 MG tablet TAKE 1 TABLET BY MOUTH EVERY DAY 90 tablet 3   blood glucose meter kit and supplies Dispense based on patient and insurance preference. Use up to four times daily as directed. (FOR ICD-10 E10.9, E11.9). 1 each 0   Continuous Blood Gluc Sensor (FREESTYLE LIBRE 2 SENSOR) MISC Use every 14 days 2 each 1   empagliflozin (JARDIANCE) 10 MG TABS tablet Take 1 tablet (10 mg total) by mouth daily before breakfast. 30 tablet 3   metoprolol tartrate (LOPRESSOR) 50 MG tablet Take 1.5 tablets (75 mg total) by mouth 2 (two) times daily. 270 tablet 3   nitroGLYCERIN (NITROSTAT) 0.4 MG SL tablet Place 1 tablet (0.4 mg total) under the tongue every 5 (five) minutes x 3 doses as needed for chest pain. 25 tablet 3   metFORMIN (GLUCOPHAGE) 500 MG tablet Take 1 tablet (500 mg total) by mouth 2 (two) times daily with a meal. 60 tablet 1   No current facility-administered medications for this visit.    REVIEW OF SYSTEMS:   Constitutional: ( - ) fevers, ( - )  chills , ( - ) night sweats Eyes: ( - ) blurriness of vision, ( - ) double vision, ( - ) watery eyes Ears, nose, mouth, throat, and face: ( - ) mucositis, ( - ) sore throat Respiratory: ( - ) cough, ( - ) dyspnea, ( - ) wheezes Cardiovascular: ( - ) palpitation, ( - ) chest discomfort, ( - ) lower extremity swelling Gastrointestinal:  ( - ) nausea, ( - ) heartburn, ( + ) change in bowel habits Skin: ( - ) abnormal skin rashes Lymphatics: ( - ) new lymphadenopathy, ( - ) easy bruising Neurological: ( - ) numbness, ( - ) tingling, ( - ) new weaknesses Behavioral/Psych: ( - ) mood change, ( - ) new changes  All other systems were  reviewed with the patient and are negative.  PHYSICAL EXAMINATION: ECOG PERFORMANCE STATUS: 0 - Asymptomatic  Vitals:   08/26/20 0842  BP: 116/79  Pulse: 78  Resp: 18  Temp: 98 F (36.7 C)  SpO2: 99%   Filed Weights   08/26/20 0842  Weight: 177 lb 4.8 oz (80.4 kg)    GENERAL: well appearing male in NAD  SKIN: skin color, texture, turgor are normal, no rashes or significant lesions EYES: conjunctiva are pink and non-injected, sclera clear OROPHARYNX: no exudate, no erythema; lips, buccal mucosa, and tongue normal  NECK:  supple, non-tender LYMPH:  no palpable lymphadenopathy in the cervical, axillary or supraclavicular lymph nodes.  LUNGS: clear to auscultation and percussion with normal breathing effort HEART: regular rate & rhythm and no murmurs and no lower extremity edema ABDOMEN: soft, non-tender, non-distended, normal bowel sounds Musculoskeletal: no cyanosis of digits and no clubbing  PSYCH: alert & oriented x 3, fluent speech NEURO: no focal motor/sensory deficits  LABORATORY DATA:  I have reviewed the data as listed CBC Latest Ref Rng & Units 08/26/2020 07/11/2019 07/10/2019  WBC 4.0 - 10.5 K/uL 7.5 9.7 12.5(H)  Hemoglobin 13.0 - 17.0 g/dL 12.7(L) 11.4(L) 11.2(L)  Hematocrit 39.0 - 52.0 % 43.1 37.7(L) 36.7(L)  Platelets 150 - 400 K/uL 251 291 320    CMP Latest Ref Rng & Units 08/26/2020 08/30/2019 07/11/2019  Glucose 70 - 99 mg/dL 124(H) - 192(H)  BUN 6 - 20 mg/dL 12 - 9  Creatinine 0.61 - 1.24 mg/dL 0.80 - 0.69  Sodium 135 - 145 mmol/L 141 - 135  Potassium 3.5 - 5.1 mmol/L 4.2 - 3.7  Chloride 98 - 111 mmol/L 105 - 103  CO2 22 - 32 mmol/L 27 - 23  Calcium 8.9 - 10.3 mg/dL 8.9 - 8.4(L)  Total Protein 6.5 - 8.1 g/dL 7.4 6.8 -  Total Bilirubin 0.3 - 1.2 mg/dL 0.5 0.4 -  Alkaline Phos 38 - 126 U/L 102 139(H) -  AST 15 - 41 U/L 17 19 -  ALT 0 - 44 U/L 15 22 -   ASSESSMENT & PLAN Shawn Landry is a 50 y.o. male who presents to the clinic for evaluation for iron  deficiency anemia. Underlying cause could be secondary to internal hemorrhoids as patient notes intermittent episodes of hematochezia. After reviewing outside labs, there is some improvement of iron deficiency anemia with oral iron supplementation. I recommend to repeat labs today to check CBC, CMP, iron and TIBC, ferritin and retic panel. If there is persistent anemia, we will consider IV iron replacement.   #Iron deficiency anemia, suspect secondary to GI bleed with hemorrhoids --Patient is under the care of [facility]. EGD and colonoscopy were done last week on 08/18/2020. Reports not available for review but patients reports that findings were unremarkable except for internal hemorrhoids.  --Most recent outside labs from 08/04/2020 shows improvement of iron deficiency anemia.  --Plan for labs today to check CBC, CMP, iron and TIBC, ferritin and retic panel. If there is persistent anemia, we will recommend IV venofer x 3 doses --Provided list of iron rich foods to incorporate into his diet.  --Advised patient to continue to take oral iron with a source of vitamin C. Recommended to avoid taking iron with calcium or antiacids.  --RTC 4 weeks after IV venofer with labs.  Orders Placed This Encounter  Procedures   CBC with Differential ([place] Only)    Standing Status:   Future    Number of Occurrences:   1    Standing Expiration Date:   08/25/2021   CMP ([place] only)    Standing Status:   Future    Number of Occurrences:   1    Standing Expiration Date:   08/25/2021   Iron and TIBC    Standing Status:   Future    Number of Occurrences:   1    Standing Expiration Date:   08/25/2021   Ferritin    Standing Status:   Future    Number of Occurrences:   1    Standing Expiration  Date:   08/25/2021   Retic Panel    Standing Status:   Future    Number of Occurrences:   1    Standing Expiration Date:   08/25/2021    All questions were answered. The patient knows to call the clinic with  any problems, questions or concerns.  I have spent a total of 60 minutes minutes of face-to-face and non-face-to-face time, preparing to see the patient, obtaining and/or reviewing separately obtained history, performing a medically appropriate examination, counseling and educating the patient, ordering medications/tests, documenting clinical information in the electronic health record,and care coordination.   Dede Query, PA-C Department of Hematology/Oncology [place] at [facility] Phone: [phone]  Patient was seen with Dr. Lorenso Courier  I have read the above note and personally examined the patient. I agree with the assessment and plan as noted above.  Briefly Shawn Landry is a 50 year old male who presents for evaluation of iron deficiency anemia.  The etiology of this patient's anemia is not clear at this time.  EGD and colonoscopy were performed last week and reportedly normal, though we are waiting the results of this report from [facility].  Patient has been taking p.o. iron therapy though his iron stores and not improved.  At this time would recommend proceeding with IV iron therapy.  Recommend consideration of small bowel capsule endoscopy if hemoglobin drop persists.   Ledell Peoples, MD Department of Hematology/Oncology [place] at [facility] Phone: [phone] Pager: [phone] Email: [email or web address]

## 2020-08-29 ENCOUNTER — Encounter: Payer: Self-pay | Admitting: Physician Assistant

## 2020-08-31 ENCOUNTER — Telehealth: Payer: Self-pay | Admitting: Physician Assistant

## 2020-08-31 NOTE — Telephone Encounter (Signed)
Scheduled per los. Called and left msg. Mailed printout  °

## 2020-09-07 ENCOUNTER — Inpatient Hospital Stay: Payer: BC Managed Care – PPO

## 2020-09-07 ENCOUNTER — Other Ambulatory Visit: Payer: Self-pay

## 2020-09-07 VITALS — BP 101/77 | HR 72 | Temp 98.4°F | Resp 18

## 2020-09-07 DIAGNOSIS — D508 Other iron deficiency anemias: Secondary | ICD-10-CM | POA: Diagnosis not present

## 2020-09-07 MED ORDER — SODIUM CHLORIDE 0.9 % IV SOLN
Freq: Once | INTRAVENOUS | Status: AC
  Filled 2020-09-07: qty 250

## 2020-09-07 MED ORDER — SODIUM CHLORIDE 0.9 % IV SOLN
200.0000 mg | Freq: Once | INTRAVENOUS | Status: AC
  Administered 2020-09-07: 200 mg via INTRAVENOUS
  Filled 2020-09-07: qty 200

## 2020-09-07 NOTE — Patient Instructions (Signed)

## 2020-09-14 ENCOUNTER — Other Ambulatory Visit: Payer: Self-pay

## 2020-09-14 ENCOUNTER — Inpatient Hospital Stay: Payer: BC Managed Care – PPO | Attending: Physician Assistant

## 2020-09-14 VITALS — BP 111/68 | HR 82 | Temp 98.2°F | Resp 18

## 2020-09-14 DIAGNOSIS — D508 Other iron deficiency anemias: Secondary | ICD-10-CM | POA: Diagnosis not present

## 2020-09-14 MED ORDER — SODIUM CHLORIDE 0.9 % IV SOLN
Freq: Once | INTRAVENOUS | Status: AC
  Filled 2020-09-14: qty 250

## 2020-09-14 MED ORDER — SODIUM CHLORIDE 0.9 % IV SOLN
200.0000 mg | Freq: Once | INTRAVENOUS | Status: AC
  Administered 2020-09-14: 200 mg via INTRAVENOUS
  Filled 2020-09-14: qty 200

## 2020-09-14 NOTE — Patient Instructions (Signed)
[facility]  Discharge Instructions: Thank you for choosing [facility] to provide your oncology and hematology care.   If you have a lab appointment with the Cancer Center, please go directly to the Cancer Center and check in at the registration area.   Wear comfortable clothing and clothing appropriate for easy access to any Portacath or PICC line.   We strive to give you quality time with your provider. You may need to reschedule your appointment if you arrive late (15 or more minutes).  Arriving late affects you and other patients whose appointments are after yours.  Also, if you miss three or more appointments without notifying the office, you may be dismissed from the clinic at the provider's discretion.      For prescription refill requests, have your pharmacy contact our office and allow 72 hours for refills to be completed.    Today you received the following chemotherapy and/or immunotherapy agents Venofer      To help prevent nausea and vomiting after your treatment, we encourage you to take your nausea medication as directed.  BELOW ARE SYMPTOMS THAT SHOULD BE REPORTED IMMEDIATELY: *FEVER GREATER THAN 100.4 F (38 C) OR HIGHER *CHILLS OR SWEATING *NAUSEA AND VOMITING THAT IS NOT CONTROLLED WITH YOUR NAUSEA MEDICATION *UNUSUAL SHORTNESS OF BREATH *UNUSUAL BRUISING OR BLEEDING *URINARY PROBLEMS (pain or burning when urinating, or frequent urination) *BOWEL PROBLEMS (unusual diarrhea, constipation, pain near the anus) TENDERNESS IN MOUTH AND THROAT WITH OR WITHOUT PRESENCE OF ULCERS (sore throat, sores in mouth, or a toothache) UNUSUAL RASH, SWELLING OR PAIN  UNUSUAL VAGINAL DISCHARGE OR ITCHING   Items with * indicate a potential emergency and should be followed up as soon as possible or go to the Emergency Department if any problems should occur.  Please show the CHEMOTHERAPY ALERT CARD or IMMUNOTHERAPY ALERT CARD at check-in to the  Emergency Department and triage nurse.  Should you have questions after your visit or need to cancel or reschedule your appointment, please contact [facility]  Dept: [phone]  and follow the prompts.  Office hours are 8:00 a.m. to 4:30 p.m. Monday - Friday. Please note that voicemails left after 4:00 p.m. may not be returned until the following business day.  We are closed weekends and major holidays. You have access to a nurse at all times for urgent questions. Please call the main number to the clinic Dept: [phone] and follow the prompts.   For any non-urgent questions, you may also contact your provider using MyChart. We now offer e-Visits for anyone 18 and older to request care online for non-urgent symptoms. For details visit [email or web address].   Also download the MyChart app! Go to the app store, search "MyChart", open the app, select [facility], and log in with your MyChart username and password.  Due to Covid, a mask is required upon entering the hospital/clinic. If you do not have a mask, one will be given to you upon arrival. For doctor visits, patients may have 1 support person aged 18 or older with them. For treatment visits, patients cannot have anyone with them due to current Covid guidelines and our immunocompromised population.   

## 2020-09-21 ENCOUNTER — Inpatient Hospital Stay: Payer: BC Managed Care – PPO

## 2020-09-21 ENCOUNTER — Other Ambulatory Visit: Payer: Self-pay

## 2020-09-21 VITALS — BP 105/75 | HR 70 | Temp 98.4°F | Resp 17

## 2020-09-21 DIAGNOSIS — D508 Other iron deficiency anemias: Secondary | ICD-10-CM

## 2020-09-21 MED ORDER — SODIUM CHLORIDE 0.9 % IV SOLN
200.0000 mg | Freq: Once | INTRAVENOUS | Status: AC
  Administered 2020-09-21: 200 mg via INTRAVENOUS
  Filled 2020-09-21: qty 200

## 2020-09-21 MED ORDER — SODIUM CHLORIDE 0.9 % IV SOLN
Freq: Once | INTRAVENOUS | Status: AC
  Filled 2020-09-21: qty 250

## 2020-09-21 NOTE — Patient Instructions (Signed)

## 2020-10-16 ENCOUNTER — Other Ambulatory Visit: Payer: Self-pay | Admitting: Physician Assistant

## 2020-10-16 DIAGNOSIS — D508 Other iron deficiency anemias: Secondary | ICD-10-CM

## 2020-10-20 ENCOUNTER — Inpatient Hospital Stay: Payer: BC Managed Care – PPO

## 2020-10-20 ENCOUNTER — Other Ambulatory Visit: Payer: Self-pay

## 2020-10-20 ENCOUNTER — Inpatient Hospital Stay: Payer: BC Managed Care – PPO | Attending: Physician Assistant | Admitting: Physician Assistant

## 2020-10-20 VITALS — BP 136/80 | HR 76 | Temp 98.0°F | Resp 17 | Ht 65.5 in | Wt 177.6 lb

## 2020-10-20 DIAGNOSIS — K648 Other hemorrhoids: Secondary | ICD-10-CM | POA: Insufficient documentation

## 2020-10-20 DIAGNOSIS — D508 Other iron deficiency anemias: Secondary | ICD-10-CM

## 2020-10-20 DIAGNOSIS — D500 Iron deficiency anemia secondary to blood loss (chronic): Secondary | ICD-10-CM | POA: Diagnosis present

## 2020-10-20 DIAGNOSIS — Z87891 Personal history of nicotine dependence: Secondary | ICD-10-CM | POA: Diagnosis not present

## 2020-10-20 DIAGNOSIS — K922 Gastrointestinal hemorrhage, unspecified: Secondary | ICD-10-CM | POA: Diagnosis not present

## 2020-10-20 LAB — CBC WITH DIFFERENTIAL (CANCER CENTER ONLY)
Abs Immature Granulocytes: 0.02 K/uL (ref 0.00–0.07)
Basophils Absolute: 0 K/uL (ref 0.0–0.1)
Basophils Relative: 1 %
Eosinophils Absolute: 0.3 K/uL (ref 0.0–0.5)
Eosinophils Relative: 3 %
HCT: 49.5 % (ref 39.0–52.0)
Hemoglobin: 14.6 g/dL (ref 13.0–17.0)
Immature Granulocytes: 0 %
Lymphocytes Relative: 26 %
Lymphs Abs: 2.1 K/uL (ref 0.7–4.0)
MCH: 22.4 pg — ABNORMAL LOW (ref 26.0–34.0)
MCHC: 29.5 g/dL — ABNORMAL LOW (ref 30.0–36.0)
MCV: 75.9 fL — ABNORMAL LOW (ref 80.0–100.0)
Monocytes Absolute: 0.9 K/uL (ref 0.1–1.0)
Monocytes Relative: 11 %
Neutro Abs: 4.7 K/uL (ref 1.7–7.7)
Neutrophils Relative %: 59 %
Platelet Count: 243 K/uL (ref 150–400)
RBC: 6.52 MIL/uL — ABNORMAL HIGH (ref 4.22–5.81)
RDW: 23.4 % — ABNORMAL HIGH (ref 11.5–15.5)
WBC Count: 8 K/uL (ref 4.0–10.5)
nRBC: 0 % (ref 0.0–0.2)

## 2020-10-20 LAB — IRON AND TIBC
Iron: 36 ug/dL — ABNORMAL LOW (ref 42–163)
Saturation Ratios: 8 % — ABNORMAL LOW (ref 20–55)
TIBC: 435 ug/dL — ABNORMAL HIGH (ref 202–409)
UIBC: 399 ug/dL — ABNORMAL HIGH (ref 117–376)

## 2020-10-20 LAB — FERRITIN: Ferritin: 20 ng/mL — ABNORMAL LOW (ref 24–336)

## 2020-10-20 NOTE — Progress Notes (Signed)
[place] [phone]   [phone]  PROGRESS NOTE  Patient Care Team: Antony Contras, MD as PCP - General (Family Medicine)  Hematological/Oncological History 1) Labs from [facility] PA-C, [facility] -04/17/2020: WBC 11.2 (H), Hgb 9.8 (L), MCV 59.0, Plt 322. Started on iron supplementation.  -06/02/2020: WBC 7.2, Hgb 11.2 (L), MCV 61.5 (L), Plt 247,  -06/18/2020: WBC 9.8, Hgb 11.0 (L), MCV 62.0 (L), Plt 265, Ferritin 4.8 (L), TIBC 524 (H), Iron 17 (L), Iron saturation 3% (L), Transferrin 374 (H) -08/04/2020: WBC 7.4, Hgb 12.5 (L), MCV 66.4 (L), Plt 238, Ferritin 12.1 (L), TIBC 484 (H), Iron 20 (L), Iron saturation 4% (L), Transferrin 346 (H)  2) 08/26/2020: Establish care with Dede Query PA-C  3) 09/07/2020-8/08/222: Received IV venofer x 3 doses.   CHIEF COMPLAINTS/PURPOSE OF CONSULTATION:  "Iron deficiency anemia "  HISTORY OF PRESENTING ILLNESS:  Shawn Landry 50 y.o. male returns today for follow-up for iron deficiency anemia.  Patient is unaccompanied for this visit.  He reports his energy levels are overall stable.  He continues to complete his ADLs on his own.  His appetite and weight are stable.  He denies any nausea, vomiting or abdominal pain.  His bowel movements are regular but he continues to have hematochezia with nearly each bowel movement.  He has applied hemorrhoid cream with minimal improvement. He is scheduled for a follow up with his gastroenterologist later this month.   He denies any fevers, chills, night sweats, shortness of breath, chest pain or cough.  He has no other complaints.  Rest of the 10 point ROS is below.   MEDICAL HISTORY:  Past Medical History:  Diagnosis Date   Anemia    Anxiety    Coronary artery disease    Diabetes mellitus without complication (HCC)    type 2   GERD (gastroesophageal reflux disease)    Heat intolerance    High cholesterol    History of heart attack    Hyperlipidemia    Neuropathy      SURGICAL HISTORY: Past Surgical History:  Procedure Laterality Date   CORONARY STENT INTERVENTION  07/09/2019   CORONARY STENT INTERVENTION N/A 07/09/2019   Procedure: CORONARY STENT INTERVENTION;  Surgeon: Belva Crome, MD;  Location: [facility];  Service: Cardiovascular;  Laterality: N/A;   LEFT HEART CATH AND CORONARY ANGIOGRAPHY N/A 07/09/2019   Procedure: LEFT HEART CATH AND CORONARY ANGIOGRAPHY;  Surgeon: Belva Crome, MD;  Location: [facility];  Service: Cardiovascular;  Laterality: N/A;   ROTATOR CUFF REPAIR Right    ROTATOR CUFF REPAIR Right 2010    SOCIAL HISTORY: Social History   Socioeconomic History   Marital status: Single    Spouse name: Not on file   Number of children: 0   Years of education: Not on file   Highest education level: Bachelor's degree (e.g., BA, AB, BS)  Occupational History    Comment: Financial planner  Tobacco Use   Smoking status: Former    Types: Cigarettes    Quit date: 2010    Years since quitting: 12.6   Smokeless tobacco: Never  Vaping Use   Vaping Use: Never used  Substance and Sexual Activity   Alcohol use: Yes    Comment: rare   Drug use: Never   Sexual activity: Not on file  Other Topics Concern   Not on file  Social History Narrative   Lives alone   Caffeine 1-2 c daily  Social Determinants of Health   Financial Resource Strain: Not on file  Food Insecurity: Not on file  Transportation Needs: Not on file  Physical Activity: Not on file  Stress: Not on file  Social Connections: Not on file  Intimate Partner Violence: Not on file    FAMILY HISTORY: Family History  Problem Relation Age of Onset   Sudden Cardiac Death Brother 106   Breast cancer Mother     ALLERGIES:  has No Known Allergies.  MEDICATIONS:  Current Outpatient Medications  Medication Sig Dispense Refill   aspirin EC 81 MG tablet Take 81 mg by mouth daily.     atorvastatin (LIPITOR) 80 MG tablet TAKE 1 TABLET BY MOUTH EVERY  DAY 90 tablet 3   blood glucose meter kit and supplies Dispense based on patient and insurance preference. Use up to four times daily as directed. (FOR ICD-10 E10.9, E11.9). 1 each 0   Continuous Blood Gluc Sensor (FREESTYLE LIBRE 2 SENSOR) MISC Use every 14 days 2 each 1   empagliflozin (JARDIANCE) 10 MG TABS tablet Take 1 tablet (10 mg total) by mouth daily before breakfast. 30 tablet 3   metFORMIN (GLUCOPHAGE) 500 MG tablet Take 1 tablet (500 mg total) by mouth 2 (two) times daily with a meal. 60 tablet 1   metoprolol tartrate (LOPRESSOR) 50 MG tablet Take 1.5 tablets (75 mg total) by mouth 2 (two) times daily. 270 tablet 3   nitroGLYCERIN (NITROSTAT) 0.4 MG SL tablet Place 1 tablet (0.4 mg total) under the tongue every 5 (five) minutes x 3 doses as needed for chest pain. 25 tablet 3   No current facility-administered medications for this visit.    REVIEW OF SYSTEMS:   Constitutional: ( - ) fevers, ( - )  chills , ( - ) night sweats Eyes: ( - ) blurriness of vision, ( - ) double vision, ( - ) watery eyes Ears, nose, mouth, throat, and face: ( - ) mucositis, ( - ) sore throat Respiratory: ( - ) cough, ( - ) dyspnea, ( - ) wheezes Cardiovascular: ( - ) palpitation, ( - ) chest discomfort, ( - ) lower extremity swelling Gastrointestinal:  ( - ) nausea, ( - ) heartburn, ( + ) change in bowel habits Skin: ( - ) abnormal skin rashes Lymphatics: ( - ) new lymphadenopathy, ( - ) easy bruising Neurological: ( - ) numbness, ( - ) tingling, ( - ) new weaknesses Behavioral/Psych: ( - ) mood change, ( - ) new changes  All other systems were reviewed with the patient and are negative.  PHYSICAL EXAMINATION: ECOG PERFORMANCE STATUS: 1 - Symptomatic but completely ambulatory  Vitals:   10/20/20 1321  BP: 136/80  Pulse: 76  Resp: 17  Temp: 98 F (36.7 C)  SpO2: 100%   Filed Weights   10/20/20 1321  Weight: 177 lb 9.6 oz (80.6 kg)    GENERAL: well appearing male in NAD  SKIN: skin color,  texture, turgor are normal, no rashes or significant lesions EYES: conjunctiva are pink and non-injected, sclera clear OROPHARYNX: no exudate, no erythema; lips, buccal mucosa, and tongue normal  LUNGS: clear to auscultation and percussion with normal breathing effort HEART: regular rate & rhythm and no murmurs and no lower extremity edema ABDOMEN: soft, non-tender, non-distended, normal bowel sounds Musculoskeletal: no cyanosis of digits and no clubbing  PSYCH: alert & oriented x 3, fluent speech NEURO: no focal motor/sensory deficits  LABORATORY DATA:  I have reviewed the data as  listed CBC Latest Ref Rng & Units 10/20/2020 08/26/2020 07/11/2019  WBC 4.0 - 10.5 K/uL 8.0 7.5 9.7  Hemoglobin 13.0 - 17.0 g/dL 14.6 12.7(L) 11.4(L)  Hematocrit 39.0 - 52.0 % 49.5 43.1 37.7(L)  Platelets 150 - 400 K/uL 243 251 291    CMP Latest Ref Rng & Units 08/26/2020 08/30/2019 07/11/2019  Glucose 70 - 99 mg/dL 124(H) - 192(H)  BUN 6 - 20 mg/dL 12 - 9  Creatinine 0.61 - 1.24 mg/dL 0.80 - 0.69  Sodium 135 - 145 mmol/L 141 - 135  Potassium 3.5 - 5.1 mmol/L 4.2 - 3.7  Chloride 98 - 111 mmol/L 105 - 103  CO2 22 - 32 mmol/L 27 - 23  Calcium 8.9 - 10.3 mg/dL 8.9 - 8.4(L)  Total Protein 6.5 - 8.1 g/dL 7.4 6.8 -  Total Bilirubin 0.3 - 1.2 mg/dL 0.5 0.4 -  Alkaline Phos 38 - 126 U/L 102 139(H) -  AST 15 - 41 U/L 17 19 -  ALT 0 - 44 U/L 15 22 -   ASSESSMENT & PLAN Shawn Landry is a 50 y.o. male who returns for a follow up for iron deficiency anemia  #Iron deficiency anemia, suspect secondary to GI bleed with hemorrhoids --Patient is under the care of [facility]. EGD and colonoscopy were done last week on 08/18/2020. Findings included internal hemorrhoids, non-thrombosed external hemorrhoids. Due to persistent hematochezia, patient is scheduled for a follow up later this month.  --Patient received IV venofer x 3 doses from 09/07/2020-09/21/2020.  --Labs today show that anemia has resolved with hemoglobin of 14.6  however there is persistent iron deficiency with ferritin 20, serum iron 36 and iron saturation 8%.  --Recommend to continue to incorporate iron rich foods into his diet.  --Patient is not takig iron pills so recommend to start taking ferrous sulfate 325 mg once daily with a source of vitamin C.  --RTC 2 months with repeat labs.   No orders of the defined types were placed in this encounter.   All questions were answered. The patient knows to call the clinic with any problems, questions or concerns.  I have spent a total of 25 minutes minutes of face-to-face and non-face-to-face time, preparing to see the patient, performing a medically appropriate examination, counseling and educating the patient, documenting clinical information in the electronic health record, and care coordination.    Dede Query, PA-C Department of Hematology/Oncology [place] at [facility] Phone: [phone]

## 2020-12-22 ENCOUNTER — Other Ambulatory Visit: Payer: Self-pay | Admitting: Physician Assistant

## 2020-12-22 DIAGNOSIS — D508 Other iron deficiency anemias: Secondary | ICD-10-CM

## 2020-12-23 ENCOUNTER — Inpatient Hospital Stay: Payer: BC Managed Care – PPO | Admitting: Physician Assistant

## 2020-12-23 ENCOUNTER — Inpatient Hospital Stay: Payer: BC Managed Care – PPO | Attending: Physician Assistant

## 2020-12-23 ENCOUNTER — Other Ambulatory Visit: Payer: Self-pay

## 2020-12-23 VITALS — BP 106/82 | HR 77 | Temp 98.7°F | Resp 17 | Ht 65.5 in | Wt 179.6 lb

## 2020-12-23 DIAGNOSIS — I252 Old myocardial infarction: Secondary | ICD-10-CM | POA: Diagnosis not present

## 2020-12-23 DIAGNOSIS — E7800 Pure hypercholesterolemia, unspecified: Secondary | ICD-10-CM | POA: Insufficient documentation

## 2020-12-23 DIAGNOSIS — Z7982 Long term (current) use of aspirin: Secondary | ICD-10-CM | POA: Diagnosis not present

## 2020-12-23 DIAGNOSIS — Z79899 Other long term (current) drug therapy: Secondary | ICD-10-CM | POA: Diagnosis not present

## 2020-12-23 DIAGNOSIS — E119 Type 2 diabetes mellitus without complications: Secondary | ICD-10-CM | POA: Insufficient documentation

## 2020-12-23 DIAGNOSIS — I2510 Atherosclerotic heart disease of native coronary artery without angina pectoris: Secondary | ICD-10-CM | POA: Diagnosis not present

## 2020-12-23 DIAGNOSIS — D508 Other iron deficiency anemias: Secondary | ICD-10-CM

## 2020-12-23 DIAGNOSIS — D509 Iron deficiency anemia, unspecified: Secondary | ICD-10-CM | POA: Insufficient documentation

## 2020-12-23 DIAGNOSIS — Z7984 Long term (current) use of oral hypoglycemic drugs: Secondary | ICD-10-CM | POA: Insufficient documentation

## 2020-12-23 DIAGNOSIS — D500 Iron deficiency anemia secondary to blood loss (chronic): Secondary | ICD-10-CM | POA: Diagnosis present

## 2020-12-23 LAB — CBC WITH DIFFERENTIAL (CANCER CENTER ONLY)
Abs Immature Granulocytes: 0.02 10*3/uL (ref 0.00–0.07)
Basophils Absolute: 0 10*3/uL (ref 0.0–0.1)
Basophils Relative: 1 %
Eosinophils Absolute: 0.2 10*3/uL (ref 0.0–0.5)
Eosinophils Relative: 3 %
HCT: 48.2 % (ref 39.0–52.0)
Hemoglobin: 14.9 g/dL (ref 13.0–17.0)
Immature Granulocytes: 0 %
Lymphocytes Relative: 24 %
Lymphs Abs: 1.8 10*3/uL (ref 0.7–4.0)
MCH: 24.1 pg — ABNORMAL LOW (ref 26.0–34.0)
MCHC: 30.9 g/dL (ref 30.0–36.0)
MCV: 78.1 fL — ABNORMAL LOW (ref 80.0–100.0)
Monocytes Absolute: 0.7 10*3/uL (ref 0.1–1.0)
Monocytes Relative: 10 %
Neutro Abs: 4.5 10*3/uL (ref 1.7–7.7)
Neutrophils Relative %: 62 %
Platelet Count: 218 10*3/uL (ref 150–400)
RBC: 6.17 MIL/uL — ABNORMAL HIGH (ref 4.22–5.81)
RDW: 19 % — ABNORMAL HIGH (ref 11.5–15.5)
WBC Count: 7.2 10*3/uL (ref 4.0–10.5)
nRBC: 0 % (ref 0.0–0.2)

## 2020-12-23 LAB — IRON AND TIBC
Iron: 40 ug/dL — ABNORMAL LOW (ref 42–163)
Saturation Ratios: 10 % — ABNORMAL LOW (ref 20–55)
TIBC: 391 ug/dL (ref 202–409)
UIBC: 351 ug/dL (ref 117–376)

## 2020-12-23 LAB — FERRITIN: Ferritin: 26 ng/mL (ref 24–336)

## 2020-12-23 NOTE — Progress Notes (Signed)
[place] Telephone:([phone]   [phone]  PROGRESS NOTE  Patient Care Team: Antony Contras, MD as PCP - General (Family Medicine)  Hematological/Oncological History 1) Labs from [facility] PA-C, [facility] -04/17/2020: WBC 11.2 (H), Hgb 9.8 (L), MCV 59.0, Plt 322. Started on iron supplementation.  -06/02/2020: WBC 7.2, Hgb 11.2 (L), MCV 61.5 (L), Plt 247,  -06/18/2020: WBC 9.8, Hgb 11.0 (L), MCV 62.0 (L), Plt 265, Ferritin 4.8 (L), TIBC 524 (H), Iron 17 (L), Iron saturation 3% (L), Transferrin 374 (H) -08/04/2020: WBC 7.4, Hgb 12.5 (L), MCV 66.4 (L), Plt 238, Ferritin 12.1 (L), TIBC 484 (H), Iron 20 (L), Iron saturation 4% (L), Transferrin 346 (H)  2) 08/26/2020: Establish care with Dede Query PA-C  3) 09/07/2020-8/08/222: Received IV venofer x 3 doses.   CHIEF COMPLAINTS/PURPOSE OF CONSULTATION:  "Iron deficiency anemia "  HISTORY OF PRESENTING ILLNESS:  Shawn Landry 50 y.o. male returns today for follow-up for iron deficiency anemia.  Patient is unaccompanied for this visit.    On exam today, Shawn Landry reports that his energy and appetite are overall stable.  He continues to have some fatigue but complete all his ADLs on his own.  He denies any nausea, vomiting or abdominal pain.  His bowel habits are unchanged.  He has a bowel movement every 2 to 3 days.  He denies any constipation or diarrhea.  He continues to have hematochezia with nearly each bowel movement due to hemorrhoids.  He has tried topical hydrocortisone cream without any improvement so he discontinued.  He is scheduled for a follow-up with his gastroenterologist to discuss possible hemorrhoidectomy.  He denies any fevers, chills, night sweats, shortness of breath, chest pain or cough.  He has no other complaints.  Rest of the 10 point ROS is below.   MEDICAL HISTORY:  Past Medical History:  Diagnosis Date   Anemia    Anxiety    Coronary artery disease    Diabetes mellitus without  complication (HCC)    type 2   GERD (gastroesophageal reflux disease)    Heat intolerance    High cholesterol    History of heart attack    Hyperlipidemia    Neuropathy     SURGICAL HISTORY: Past Surgical History:  Procedure Laterality Date   CORONARY STENT INTERVENTION  07/09/2019   CORONARY STENT INTERVENTION N/A 07/09/2019   Procedure: CORONARY STENT INTERVENTION;  Surgeon: Belva Crome, MD;  Location: [facility];  Service: Cardiovascular;  Laterality: N/A;   LEFT HEART CATH AND CORONARY ANGIOGRAPHY N/A 07/09/2019   Procedure: LEFT HEART CATH AND CORONARY ANGIOGRAPHY;  Surgeon: Belva Crome, MD;  Location: [facility];  Service: Cardiovascular;  Laterality: N/A;   ROTATOR CUFF REPAIR Right    ROTATOR CUFF REPAIR Right 2010    SOCIAL HISTORY: Social History   Socioeconomic History   Marital status: Single    Spouse name: Not on file   Number of children: 0   Years of education: Not on file   Highest education level: Bachelor's degree (e.g., BA, AB, BS)  Occupational History    Comment: Financial planner  Tobacco Use   Smoking status: Former    Types: Cigarettes    Quit date: 2010    Years since quitting: 12.8   Smokeless tobacco: Never  Vaping Use   Vaping Use: Never used  Substance and Sexual Activity   Alcohol use: Yes    Comment: rare   Drug use: Never  Sexual activity: Not on file  Other Topics Concern   Not on file  Social History Narrative   Lives alone   Caffeine 1-2 c daily   Social Determinants of Health   Financial Resource Strain: Not on file  Food Insecurity: Not on file  Transportation Needs: Not on file  Physical Activity: Not on file  Stress: Not on file  Social Connections: Not on file  Intimate Partner Violence: Not on file    FAMILY HISTORY: Family History  Problem Relation Age of Onset   Sudden Cardiac Death Brother 37   Breast cancer Mother     ALLERGIES:  has No Known Allergies.  MEDICATIONS:  Current  Outpatient Medications  Medication Sig Dispense Refill   aspirin EC 81 MG tablet Take 81 mg by mouth daily.     atorvastatin (LIPITOR) 80 MG tablet TAKE 1 TABLET BY MOUTH EVERY DAY 90 tablet 3   blood glucose meter kit and supplies Dispense based on patient and insurance preference. Use up to four times daily as directed. (FOR ICD-10 E10.9, E11.9). 1 each 0   Continuous Blood Gluc Sensor (FREESTYLE LIBRE 2 SENSOR) MISC Use every 14 days 2 each 1   empagliflozin (JARDIANCE) 10 MG TABS tablet Take 1 tablet (10 mg total) by mouth daily before breakfast. 30 tablet 3   FERROUS SULFATE PO Take 325 mg by mouth daily.     metoprolol tartrate (LOPRESSOR) 50 MG tablet Take 1.5 tablets (75 mg total) by mouth 2 (two) times daily. 270 tablet 3   nitroGLYCERIN (NITROSTAT) 0.4 MG SL tablet Place 1 tablet (0.4 mg total) under the tongue every 5 (five) minutes x 3 doses as needed for chest pain. 25 tablet 3   pantoprazole (PROTONIX) 40 MG tablet Take 40 mg by mouth daily.     metFORMIN (GLUCOPHAGE) 500 MG tablet Take 1 tablet (500 mg total) by mouth 2 (two) times daily with a meal. 60 tablet 1   No current facility-administered medications for this visit.    REVIEW OF SYSTEMS:   Constitutional: ( - ) fevers, ( - )  chills , ( - ) night sweats Eyes: ( - ) blurriness of vision, ( - ) double vision, ( - ) watery eyes Ears, nose, mouth, throat, and face: ( - ) mucositis, ( - ) sore throat Respiratory: ( - ) cough, ( - ) dyspnea, ( - ) wheezes Cardiovascular: ( - ) palpitation, ( - ) chest discomfort, ( - ) lower extremity swelling Gastrointestinal:  ( - ) nausea, ( - ) heartburn, ( - ) change in bowel habits Skin: ( - ) abnormal skin rashes Lymphatics: ( - ) new lymphadenopathy, ( - ) easy bruising Neurological: ( - ) numbness, ( - ) tingling, ( - ) new weaknesses Behavioral/Psych: ( - ) mood change, ( - ) new changes  All other systems were reviewed with the patient and are negative.  PHYSICAL  EXAMINATION: ECOG PERFORMANCE STATUS: 1 - Symptomatic but completely ambulatory  Vitals:   12/23/20 0753  BP: 106/82  Pulse: 77  Resp: 17  Temp: 98.7 F (37.1 C)  SpO2: 99%   Filed Weights   12/23/20 0753  Weight: 179 lb 9.6 oz (81.5 kg)    GENERAL: well appearing male in NAD  SKIN: skin color, texture, turgor are normal, no rashes or significant lesions EYES: conjunctiva are pink and non-injected, sclera clear OROPHARYNX: no exudate, no erythema; lips, buccal mucosa, and tongue normal  LUNGS: clear to auscultation   and percussion with normal breathing effort HEART: regular rate & rhythm and no murmurs and no lower extremity edema ABDOMEN: soft, non-tender, non-distended, normal bowel sounds Musculoskeletal: no cyanosis of digits and no clubbing  PSYCH: alert & oriented x 3, fluent speech NEURO: no focal motor/sensory deficits  LABORATORY DATA:  I have reviewed the data as listed CBC Latest Ref Rng & Units 12/23/2020 10/20/2020 08/26/2020  WBC 4.0 - 10.5 K/uL 7.2 8.0 7.5  Hemoglobin 13.0 - 17.0 g/dL 14.9 14.6 12.7(L)  Hematocrit 39.0 - 52.0 % 48.2 49.5 43.1  Platelets 150 - 400 K/uL 218 243 251    CMP Latest Ref Rng & Units 08/26/2020 08/30/2019 07/11/2019  Glucose 70 - 99 mg/dL 124(H) - 192(H)  BUN 6 - 20 mg/dL 12 - 9  Creatinine 0.61 - 1.24 mg/dL 0.80 - 0.69  Sodium 135 - 145 mmol/L 141 - 135  Potassium 3.5 - 5.1 mmol/L 4.2 - 3.7  Chloride 98 - 111 mmol/L 105 - 103  CO2 22 - 32 mmol/L 27 - 23  Calcium 8.9 - 10.3 mg/dL 8.9 - 8.4(L)  Total Protein 6.5 - 8.1 g/dL 7.4 6.8 -  Total Bilirubin 0.3 - 1.2 mg/dL 0.5 0.4 -  Alkaline Phos 38 - 126 U/L 102 139(H) -  AST 15 - 41 U/L 17 19 -  ALT 0 - 44 U/L 15 22 -   ASSESSMENT & PLAN Shawn Landry is a 50 y.o. male who returns for a follow up for iron deficiency anemia  #Iron deficiency anemia, suspect secondary to GI bleed with hemorrhoids --Patient is under the care of [facility]. EGD and colonoscopy were done last week on  08/18/2020. Findings included internal hemorrhoids, non-thrombosed external hemorrhoids. Due to persistent hematochezia, patient is scheduled for a follow up next month to discuss surgical intervention.   --Patient received IV venofer x 3 doses from 09/07/2020-09/21/2020.  --Labs today show no evidence of anemia with a hemoglobin of 14.9, MCV 78.1. Iron deficiency has improved but still shows deficiency with serum iron 40, iron saturation 10% and ferritin 26. --Recommend to continue to incorporate iron rich foods into his diet.  --Recommend to continue to take ferrous sulfate 325 mg once daily with a source of vitamin C.  --RTC 6 months with labs.   No orders of the defined types were placed in this encounter.   All questions were answered. The patient knows to call the clinic with any problems, questions or concerns.  I have spent a total of 25 minutes minutes of face-to-face and non-face-to-face time, preparing to see the patient, performing a medically appropriate examination, counseling and educating the patient, documenting clinical information in the electronic health record, and care coordination.    Dede Query, PA-C Department of Hematology/Oncology [place] at [facility] Phone: [phone]

## 2021-01-19 ENCOUNTER — Other Ambulatory Visit: Payer: BC Managed Care – PPO

## 2021-01-19 ENCOUNTER — Ambulatory Visit: Payer: BC Managed Care – PPO | Admitting: Physician Assistant

## 2021-03-22 NOTE — Therapy (Deleted)
OUTPATIENT PHYSICAL THERAPY THORACOLUMBAR EVALUATION   Patient Name: Shawn Landry MRN: [id number] DOB:01/10/1971, 51 y.o., male Today's Date: 03/22/2021    Past Medical History:  Diagnosis Date   Anemia    Anxiety    Coronary artery disease    Diabetes mellitus without complication (HCC)    type 2   GERD (gastroesophageal reflux disease)    Heat intolerance    High cholesterol    History of heart attack    Hyperlipidemia    Neuropathy    Past Surgical History:  Procedure Laterality Date   CORONARY STENT INTERVENTION  07/09/2019   CORONARY STENT INTERVENTION N/A 07/09/2019   Procedure: CORONARY STENT INTERVENTION;  Surgeon: Lyn Records, MD;  [facility]: [facility];  Service: Cardiovascular;  Laterality: N/A;   LEFT HEART CATH AND CORONARY ANGIOGRAPHY N/A 07/09/2019   Procedure: LEFT HEART CATH AND CORONARY ANGIOGRAPHY;  Surgeon: Lyn Records, MD;  [facility]: [facility];  Service: Cardiovascular;  Laterality: N/A;   ROTATOR CUFF REPAIR Right    ROTATOR CUFF REPAIR Right 2010   Patient Active Problem List   Diagnosis Date Noted   IDA (iron deficiency anemia) 08/26/2020   ACS (acute coronary syndrome) (HCC) 07/09/2019   Elevated troponin 07/09/2019   GERD (gastroesophageal reflux disease) 07/09/2019   Hyperlipidemia 07/09/2019   Hyperglycemia 07/09/2019   Chest pain     PCP: Tally Joe, MD  REFERRING PROVIDER: Legrand Como, PA-C   REFERRING DIAG: K59.00 (ICD-10-CM) - Constipation, unspecified   THERAPY DIAG:  No diagnosis found.  ONSET DATE: 03/22/21   SUBJECTIVE:                                                                                                                                                                                           SUBJECTIVE STATEMENT: *** PERTINENT HISTORY:  ***  PAIN:  Are you having pain? No NPRS scale: 0/10 Pain location: dfs Pain orientation: Other: no pain   PAIN TYPE: none Pain  description:  no   Aggravating factors: none  Relieving factors: none  PRECAUTIONS: None  WEIGHT BEARING RESTRICTIONS No  FALLS:  Has patient fallen in last 6 months? No, Number of falls: 0  LIVING ENVIRONMENT: Lives with: {[facility] lives with:25569::"lives with their family"} Lives in: {Lives in:25570} Stairs: {yes/no:20286}; {Stairs:24000} Has following equipment at home: {Assistive devices:23999}  OCCUPATION: ***  PLOF: {PLOF:24004}  PATIENT GOALS ***   OBJECTIVE:   DIAGNOSTIC FINDINGS:  ***  PATIENT SURVEYS:  {rehab surveys:24030}  SCREENING FOR RED FLAGS: Bowel or bladder incontinence: {Yes/No:304960894} Spinal tumors: {Yes/No:304960894} Cauda equina syndrome: {Yes/No:304960894} Compression fracture: {Yes/No:304960894} Abdominal  aneurysm: {Yes/No:304960894}  COGNITION:  Overall cognitive status: Within functional limits for tasks assessed     SENSATION:  Light touch: Appears intact  Stereognosis: Appears intact  Hot/Cold: Appears intact  Proprioception: Appears intact  MUSCLE LENGTH: Hamstrings: Right *** deg; Left *** deg Thomas test: Right *** deg; Left *** deg  POSTURE:  ***  PALPATION: ***  LUMBARAROM/PROM  A/PROM A/PROM  03/22/2021  Flexion   Extension   Right lateral flexion   Left lateral flexion   Right rotation   Left rotation    (Blank rows = not tested)  LE AROM/PROM:  A/PROM Right 03/22/2021 Left 03/22/2021  Hip flexion    Hip extension    Hip abduction    Hip adduction    Hip internal rotation    Hip external rotation    Knee flexion    Knee extension    Ankle dorsiflexion    Ankle plantarflexion    Ankle inversion    Ankle eversion     (Blank rows = not tested)  LE MMT:  MMT Right 03/22/2021 Left 03/22/2021  Hip flexion    Hip extension    Hip abduction    Hip adduction    Hip internal rotation    Hip external rotation    Knee flexion    Knee extension    Ankle dorsiflexion    Ankle plantarflexion    Ankle  inversion    Ankle eversion     (Blank rows = not tested)  LUMBAR SPECIAL TESTS:  {lumbar special test:25242}  FUNCTIONAL TESTS:  {Functional tests:24029}  GAIT: Distance walked: *** Assistive device utilized: {Assistive devices:23999} Level of assistance: {Levels of assistance:24026} Comments: ***    TODAY'S TREATMENT  ***   PATIENT EDUCATION:  Education details: *** Person educated: {Person educated:25204} Education method: {Education Method:25205} Education comprehension: {Education Comprehension:25206}   HOME EXERCISE PROGRAM: ***  ASSESSMENT:  CLINICAL IMPRESSION: Patient is a *** y.o. *** who was seen today for physical therapy evaluation and treatment for ***. Objective impairments include {opptimpairments:25111}. These impairments are limiting patient from {activity limitations:25113}. Personal factors including {Personal factors:25162} are also affecting patient's functional outcome. Patient will benefit from skilled PT to address above impairments and improve overall function.  REHAB POTENTIAL: {rehabpotential:25112}  CLINICAL DECISION MAKING: {clinical decision making:25114}  EVALUATION COMPLEXITY: {Evaluation complexity:25115}   GOALS: Goals reviewed with patient? {yes/no:20286}  SHORT TERM GOALS:  STG Name Target Date Goal status  1 *** Baseline:  04/12/2021 {GOALSTATUS:25110}  2 *** Baseline:  {:PHR,WPLUS12} INITIAL  3 *** Baseline: 05/03/2021 {GOALSTATUS:25110}  4 *** Baseline: {follow up:25551} {GOALSTATUS:25110}  5 *** Baseline: {follow up:25551} {GOALSTATUS:25110}  6 *** Baseline: {follow up:25551} {GOALSTATUS:25110}  7 *** Baseline: {follow up:25551} {GOALSTATUS:25110}   LONG TERM GOALS:   LTG Name Target Date Goal status  1 *** Baseline: {follow up:25551} {GOALSTATUS:25110}  2 *** Baseline: {follow up:25551} {GOALSTATUS:25110}  3 *** Baseline: {follow up:25551} {GOALSTATUS:25110}  4 *** Baseline: {follow up:25551}  {GOALSTATUS:25110}  5 *** Baseline: {follow up:25551} {GOALSTATUS:25110}  6 *** Baseline: {follow up:25551} {GOALSTATUS:25110}  7 *** Baseline: {follow up:25551} {GOALSTATUS:25110}   PLAN: PT FREQUENCY: {rehab frequency:25116}  PT DURATION: {rehab duration:25117}  PLANNED INTERVENTIONS: {rehab planned interventions:25118::"Therapeutic exercises","Therapeutic activity","Neuro Muscular re-education","Balance training","Gait training","Patient/Family education","Joint mobilization"}  PLAN FOR NEXT SESSION: Barbaraann Faster, PT 03/22/2021, 9:02 AM

## 2021-03-23 ENCOUNTER — Encounter: Payer: Self-pay | Admitting: Physical Therapy

## 2021-03-23 ENCOUNTER — Ambulatory Visit: Payer: BC Managed Care – PPO | Attending: Gastroenterology | Admitting: Physical Therapy

## 2021-03-23 DIAGNOSIS — K5900 Constipation, unspecified: Secondary | ICD-10-CM | POA: Diagnosis not present

## 2021-03-23 DIAGNOSIS — R293 Abnormal posture: Secondary | ICD-10-CM

## 2021-03-23 DIAGNOSIS — R279 Unspecified lack of coordination: Secondary | ICD-10-CM

## 2021-03-23 NOTE — Therapy (Signed)
[facility] [address] [place], [place], [address] Phone: [phone]   Fax:  [phone]  Physical Therapy Evaluation  Patient Details  Name: Shawn Landry MRN: [id number] Date of Birth: [birth year] Referring Provider (PT): Legrand Como, [place]   Encounter Date: 03/23/2021   PT End of Session - 03/23/21 1146     Visit Number 1    Date for PT Re-Evaluation 05/21/21    Authorization Type BCBS    PT Start Time 0800    PT Stop Time 0838    PT Time Calculation (min) 38 min    Activity Tolerance Patient tolerated treatment well    Behavior During Therapy [facility] for tasks assessed/performed             Past Medical History:  Diagnosis Date   Anemia    Anxiety    Coronary artery disease    Diabetes mellitus without complication (HCC)    type 2   GERD (gastroesophageal reflux disease)    Heat intolerance    High cholesterol    History of heart attack    Hyperlipidemia    Neuropathy     Past Surgical History:  Procedure Laterality Date   CORONARY STENT INTERVENTION  07/09/2019   CORONARY STENT INTERVENTION N/A 07/09/2019   Procedure: CORONARY STENT INTERVENTION;  Surgeon: Lyn Records, MD;  [facility];  Service: Cardiovascular;  Laterality: N/A;   LEFT HEART CATH AND CORONARY ANGIOGRAPHY N/A 07/09/2019   Procedure: LEFT HEART CATH AND CORONARY ANGIOGRAPHY;  Surgeon: Lyn Records, MD;  [facility];  Service: Cardiovascular;  Laterality: N/A;   ROTATOR CUFF REPAIR Right    ROTATOR CUFF REPAIR Right 2010    There were no vitals filed for this visit.    Subjective Assessment - 03/23/21 0804     Subjective Pt reports he was unsure why the doctor recommend PT but has been having constipation his whole life usually having 2-3 BMs per week, doesn't feel like he needs to strain to empty but maybe has Bm ever 2-3 days and then will have 2-3 urges while in the bathroom to empty. Pt  reports he usually does not feel fully empty after first BM that day then will have a few back to back with urge. Pt does need to wipe many many times to get clean, does have bleeding from hemorrhoids. Does not leak stool. Usually type 4 stool.    Pertinent History n/a    How long can you sit comfortably? no limits    How long can you stand comfortably? no limits    How long can you walk comfortably? no limits    Patient Stated Goals to be more regular with BMs    Currently in Pain? No/denies                [facility] PT Assessment - 03/23/21 0001       Assessment   Medical Diagnosis K59.00 (ICD-10-CM) - Constipation, unspecified    Referring Provider (PT) McMichael, Bayley M, PA-C    Onset Date/Surgical Date --   life-long   Prior Therapy no      Precautions   Precautions None      Restrictions   Weight Bearing Restrictions No      Balance Screen   Has the patient fallen in the past 6 months No    Has the patient had a decrease in activity level because of  a fear of falling?  No    Is the patient reluctant to leave their home because of a fear of falling?  No      Home [employer] information [employer] residence    Living Arrangements Alone      Prior Function   Level of Independence Independent    Vocation Full time employment    Therapist, occupational      Cognition   Overall Cognitive Status Within Functional Limits for tasks assessed      Sensation   Light Touch Appears Intact      Coordination   Gross Motor Movements are Fluid and Coordinated Yes    Fine Motor Movements are Fluid and Coordinated Yes      Posture/Postural Control   Posture/Postural Control Postural limitations    Postural Limitations Rounded Shoulders;Posterior pelvic tilt      ROM / Strength   AROM / PROM / Strength AROM;Strength      AROM   Overall AROM Comments WFL      Strength   Overall Strength Comments [facility]      Flexibility   Soft Tissue Assessment  /Muscle Length yes   bil hamstrings and adductors limited by 25%     Palpation   Palpation comment fasical restrictions in all quadrants of abdomen, though not painful.                        Objective measurements completed on examination: See above findings.     Pelvic Floor Special Questions - 03/23/21 0001     Prior Pelvic/Prostate Exam Yes   normal   History of sexually transmitted disease No    Marinoff Scale no problems    Urinary Leakage No    Urinary urgency No   sometimes will wake 1-2x per night to urinate   Urinary frequency no    Fecal incontinence No   Type 4 on average, no leakage, no straining per pt, does wipe "a lot" to get clean.   Fluid intake usually 24oz filled 2x.    Caffeine beverages coffee 2 cups in am and one in afternoon    Falling out feeling (prolapse) No    Pelvic Floor Internal Exam pt deferred at this time                       PT Education - 03/23/21 0841     Education Details Pt educated on exam findings, POC, voiding mechanics, fiber types, and abdominal massage    Person(s) Educated Patient    Methods Explanation;Demonstration;Tactile cues;Verbal cues;Handout    Comprehension Verbalized understanding;Returned demonstration              PT Short Term Goals - 03/23/21 1144       PT SHORT TERM GOAL #1   Title pt to be I with HEP    Time 4    Period Weeks    Status New    Target Date 04/20/21      PT SHORT TERM GOAL #2   Title pt to recall proper voiding mechanics and breathing mechanics and technique for adbominal massage without cues with I for improved bowel regularity.    Time 4    Period Weeks    Status New    Target Date 04/20/21               PT Long Term Goals - 03/23/21 1145  PT LONG TERM GOAL #1   Title Pt to be I with advanced HEP    Time 2    Period Months    Status New    Target Date 05/21/21      PT LONG TERM GOAL #2   Title pt to report improved bowel regularity  to at least 3-4x per week with type 4 stool type and spending no more than 20 mins attempting BM    Time 2    Period Months    Status New    Target Date 05/21/21                    Plan - 03/23/21 0841     Clinical Impression Statement Pt is 51yo male presenting to clinic with referral for constipation which pt has had throughout his life. Pt reports he has hemorrhoids and will see bleeding with wiping and sometimes in toliet with BMs, does not leak stool or urine, and will only have urge to have BM every 2-3 days without straining. Pt does report once he has the urge to have BM he doesn't strain but will sit at [facility] for at least 30 mins sometimes 45 mins and then doesn't feel fully empty the first time he evacuates, will stand to wash hands and feel the urge to empty again, and will do this 2-3x before fully empty. Pt also reports he needs to wipe "a lot" after BM to get fully clean. Pt drinks about 24oz of water twice per day and 3 cups of coffee per day. Pt found to have fascial restrictions in all quadrants of abdomen though not painful. Pt educated on voiding mechanics, breathing mechanics, fiber types, and abdominal massage to improve bowel regularity.    Personal Factors and Comorbidities Time since onset of injury/illness/exacerbation    Examination-Activity Limitations Toileting    Stability/Clinical Decision Making Stable/Uncomplicated    Clinical Decision Making Low    Rehab Potential Good    PT Frequency 1x / week    PT Duration 8 weeks    PT Treatment/Interventions ADLs/Self Care Home Management;Functional mobility training;Therapeutic activities;Therapeutic exercise;Neuromuscular re-education;Manual techniques;Patient/family education;Passive range of motion;Energy conservation    PT Next Visit Plan go over handouts, HEP    PT Home Exercise Plan abdominal massage, voiding mechanics    Consulted and Agree with Plan of Care Patient             Patient will benefit  from skilled therapeutic intervention in order to improve the following deficits and impairments:  Decreased coordination, Increased fascial restricitons, Decreased mobility, Postural dysfunction, Improper body mechanics, Impaired flexibility  Visit Diagnosis: Abnormal posture - Plan: PT plan of care cert/re-cert  Unspecified lack of coordination - Plan: PT plan of care cert/re-cert     Problem List Patient Active Problem List   Diagnosis Date Noted   IDA (iron deficiency anemia) 08/26/2020   ACS (acute coronary syndrome) (HCC) 07/09/2019   Elevated troponin 07/09/2019   GERD (gastroesophageal reflux disease) 07/09/2019   Hyperlipidemia 07/09/2019   Hyperglycemia 07/09/2019   Chest pain     Otelia Sergeant, PT, DPT 03/23/2309:49 AM   [facility] @ [facility] [address] [place], [place], [address] Phone: [phone]   Fax:  [phone]  Name: KAICEN DESENA MRN: [id number] Date of Birth: 1970/05/19

## 2021-03-23 NOTE — Patient Instructions (Addendum)

## 2021-04-05 ENCOUNTER — Other Ambulatory Visit: Payer: Self-pay

## 2021-04-05 ENCOUNTER — Ambulatory Visit: Payer: BC Managed Care – PPO | Admitting: Physical Therapy

## 2021-04-05 DIAGNOSIS — R279 Unspecified lack of coordination: Secondary | ICD-10-CM

## 2021-04-05 DIAGNOSIS — R293 Abnormal posture: Secondary | ICD-10-CM

## 2021-04-05 DIAGNOSIS — K5900 Constipation, unspecified: Secondary | ICD-10-CM | POA: Diagnosis not present

## 2021-04-05 NOTE — Therapy (Signed)
[facility] @ [facility] [address] [place], [place], [address] Phone: [phone]   Fax:  [phone]  Physical Therapy Treatment  Patient Details  Name: Shawn Landry MRN: [id number] Date of Birth: [birth year] Referring Provider (PT): Legrand Como, [place]   Encounter Date: 04/05/2021   PT End of Session - 04/05/21 0758     Visit Number 2    Date for PT Re-Evaluation 05/21/21    Authorization Type BCBS    PT Start Time 0759    PT Stop Time 0837    PT Time Calculation (min) 38 min    Activity Tolerance Patient tolerated treatment well    Behavior During Therapy [facility] for tasks assessed/performed             Past Medical History:  Diagnosis Date   Anemia    Anxiety    Coronary artery disease    Diabetes mellitus without complication (HCC)    type 2   GERD (gastroesophageal reflux disease)    Heat intolerance    High cholesterol    History of heart attack    Hyperlipidemia    Neuropathy     Past Surgical History:  Procedure Laterality Date   CORONARY STENT INTERVENTION  07/09/2019   CORONARY STENT INTERVENTION N/A 07/09/2019   Procedure: CORONARY STENT INTERVENTION;  Surgeon: Lyn Records, MD;  [facility]: [facility];  Service: Cardiovascular;  Laterality: N/A;   LEFT HEART CATH AND CORONARY ANGIOGRAPHY N/A 07/09/2019   Procedure: LEFT HEART CATH AND CORONARY ANGIOGRAPHY;  Surgeon: Lyn Records, MD;  [facility]: [facility];  Service: Cardiovascular;  Laterality: N/A;   ROTATOR CUFF REPAIR Right    ROTATOR CUFF REPAIR Right 2010    There were no vitals filed for this visit.   Subjective Assessment - 04/05/21 0802     Subjective Pt reports he has been using squatty potty and now reports he is only needing to have a BM only once now without straining and feels empty.    Pertinent History n/a    How long can you sit comfortably? no limits    How long can you stand comfortably? no limits    How long  can you walk comfortably? no limits    Patient Stated Goals to be more regular with BMs    Currently in Pain? No/denies                               [facility] Adult PT Treatment/Exercise - 04/05/21 0001       Self-Care   Self-Care Other Self-Care Comments    Other Self-Care Comments  Pt educated on how to complete abdominal massage with CW/CCW and ILU technique and pt then demonstrated good technique.      Manual Therapy   Manual Therapy Soft tissue mobilization    Manual therapy comments Manual abdominal massage in ILU method and CW/CWW completed x5 each. Pt reported improved understanding and motivated to attempt.                     PT Education - 04/05/21 0839     Education Details Pt educated on additional abdominal massage technique    Person(s) Educated Patient    Methods Explanation;Demonstration;Tactile cues;Verbal cues    Comprehension Returned demonstration;Verbalized understanding              PT Short Term Goals -  03/23/21 1144       PT SHORT TERM GOAL #1   Title pt to be I with HEP    Time 4    Period Weeks    Status New    Target Date 04/20/21      PT SHORT TERM GOAL #2   Title pt to recall proper voiding mechanics and breathing mechanics and technique for adbominal massage without cues with I for improved bowel regularity.    Time 4    Period Weeks    Status New    Target Date 04/20/21               PT Long Term Goals - 03/23/21 1145       PT LONG TERM GOAL #1   Title Pt to be I with advanced HEP    Time 2    Period Months    Status New    Target Date 05/21/21      PT LONG TERM GOAL #2   Title pt to report improved bowel regularity to at least 3-4x per week with type 4 stool type and spending no more than 20 mins attempting BM    Time 2    Period Months    Status New    Target Date 05/21/21                   Plan - 04/05/21 0839     Clinical Impression Statement Pt reports he has noticed  an improvement in BMs overall with now fully emptying with one BM with use of squatty potty and no longer needing to strain. Pt does report he is still only having BMs 2-3x per week but pleased with progress so far. Pt session focused on educated and manual work for abdominal massage and techniques at home for improved bowel regularity. Pt reported understanding and motivated to attempt. Pt educated on voiding mechanics, breathing mechanics, fiber types, and abdominal massage to improve bowel regularity.    Personal Factors and Comorbidities Time since onset of injury/illness/exacerbation    Examination-Activity Limitations Toileting    Stability/Clinical Decision Making Stable/Uncomplicated    Rehab Potential Good    PT Frequency 1x / week    PT Duration 8 weeks    PT Treatment/Interventions ADLs/Self Care Home Management;Functional mobility training;Therapeutic activities;Therapeutic exercise;Neuromuscular re-education;Manual techniques;Patient/family education;Passive range of motion;Energy conservation    PT Next Visit Plan go over handouts, HEP    PT Home Exercise Plan abdominal massage, voiding mechanics    Consulted and Agree with Plan of Care Patient             Patient will benefit from skilled therapeutic intervention in order to improve the following deficits and impairments:  Decreased coordination, Increased fascial restricitons, Decreased mobility, Postural dysfunction, Improper body mechanics, Impaired flexibility  Visit Diagnosis: Abnormal posture  Unspecified lack of coordination     Problem List Patient Active Problem List   Diagnosis Date Noted   IDA (iron deficiency anemia) 08/26/2020   ACS (acute coronary syndrome) (HCC) 07/09/2019   Elevated troponin 07/09/2019   GERD (gastroesophageal reflux disease) 07/09/2019   Hyperlipidemia 07/09/2019   Hyperglycemia 07/09/2019   Chest pain     Otelia Sergeant, PT, DPT 02/20/238:46 AM   [facility]  Outpatient & Specialty Rehab @ [facility] [address] [place], [place], [address] Phone: [phone]   Fax:  [phone]  Name: Shawn Landry MRN: [id number] Date of Birth: 03-24-1970

## 2021-04-06 ENCOUNTER — Other Ambulatory Visit: Payer: Self-pay | Admitting: Cardiovascular Disease

## 2021-04-22 ENCOUNTER — Other Ambulatory Visit: Payer: Self-pay

## 2021-04-22 ENCOUNTER — Ambulatory Visit: Payer: BC Managed Care – PPO | Attending: Gastroenterology | Admitting: Physical Therapy

## 2021-04-22 DIAGNOSIS — R293 Abnormal posture: Secondary | ICD-10-CM | POA: Insufficient documentation

## 2021-04-22 DIAGNOSIS — R279 Unspecified lack of coordination: Secondary | ICD-10-CM | POA: Diagnosis present

## 2021-04-22 NOTE — Therapy (Signed)
[facility] ?[facility] ?[place], [place], [address] ?Phone: [phone]   Fax:  [phone] ? ?Physical Therapy Treatment ? ?Patient Details  ?Name: Shawn Landry ?MRN: [id number] ?Date of Birth: 12/25/1970 ?Referring Provider (PT): [place], Addison Bailey, [place] ? ? ?Encounter Date: 04/22/2021 ? ? PT End of Session - 04/22/21 0746   ? ? Visit Number 3   ? Date for PT Re-Evaluation 05/21/21   ? Authorization Type BCBS   ? PT Start Time [phone]   ? PT Stop Time 0810   pt denied additional needs and additional treatment once gone over discharge plan  ? PT Time Calculation (min) 17 min   ? Activity Tolerance Patient tolerated treatment well   ? Behavior During Therapy [facility] for tasks assessed/performed   ? ?  ?  ? ?  ? ? ?Past Medical History:  ?Diagnosis Date  ? Anemia   ? Anxiety   ? Coronary artery disease   ? Diabetes mellitus without complication ([place])   ? type 2  ? GERD (gastroesophageal reflux disease)   ? Heat intolerance   ? High cholesterol   ? History of heart attack   ? Hyperlipidemia   ? Neuropathy   ? ? ?Past Surgical History:  ?Procedure Laterality Date  ? CORONARY STENT INTERVENTION  07/09/2019  ? CORONARY STENT INTERVENTION N/A 07/09/2019  ? Procedure: CORONARY STENT INTERVENTION;  Surgeon: Belva Crome, MD;  Location: [facility];  Service: Cardiovascular;  Laterality: N/A;  ? LEFT HEART CATH AND CORONARY ANGIOGRAPHY N/A 07/09/2019  ? Procedure: LEFT HEART CATH AND CORONARY ANGIOGRAPHY;  Surgeon: Belva Crome, MD;  Location: [facility];  Service: Cardiovascular;  Laterality: N/A;  ? ROTATOR CUFF REPAIR Right   ? ROTATOR CUFF REPAIR Right 2010  ? ? ?There were no vitals filed for this visit. ? ? Subjective Assessment - 04/22/21 0747   ? ? Subjective Pt reports "I've been doing great. I feel so much better."   ? Pertinent History n/a   ? How long can you sit comfortably? no limits   ? How long can you stand comfortably? no limits   ? How  long can you walk comfortably? no limits   ? Patient Stated Goals to be more regular with BMs   ? Currently in Pain? No/denies   ? ?  ?  ? ?  ? ? ? ? ? [facility] PT Assessment - 04/22/21 0001   ? ?  ? Assessment  ? Medical Diagnosis K59.00 (ICD-10-CM) - Constipation, unspecified   ? Referring Provider (PT) Garnette Scheuermann, PA-C   ? Onset Date/Surgical Date --   life-long  ? ?  ?  ? ?  ? ? ? ? ? ? ? ? ? ? ? ? ? ? ? ? [facility] Adult PT Treatment/Exercise - 04/22/21 0001   ? ?  ? Self-Care  ? Self-Care Other Self-Care Comments   ? Other Self-Care Comments  pt educated on continued use of step stool for BM, breathing  and voiding mechanics, breathing mechanics, and given HEP for continued mobility post PT and went over   ? ?  ?  ? ?  ? ? ? ? ? ? ? ? ? ? ? ? PT Short Term Goals - 04/22/21 0839   ? ?  ? PT SHORT TERM GOAL #1  ? Title pt to be I with HEP   ? Time 4   ?  Period Weeks   ? Status Achieved   ? Target Date 04/20/21   ?  ? PT SHORT TERM GOAL #2  ? Title pt to recall proper voiding mechanics and breathing mechanics and technique for adbominal massage without cues with I for improved bowel regularity.   ? Time 4   ? Period Weeks   ? Status Achieved   ? Target Date 04/20/21   ? ?  ?  ? ?  ? ? ? ? PT Long Term Goals - 04/22/21 0839   ? ?  ? PT LONG TERM GOAL #1  ? Title Pt to be I with advanced HEP   ? Time 2   ? Period Months   ? Status Achieved   ? Target Date 05/21/21   ?  ? PT LONG TERM GOAL #2  ? Title pt to report improved bowel regularity to at least 3-4x per week with type 4 stool type and spending no more than 20 mins attempting BM   ? Time 2   ? Period Months   ? Status Achieved   ? Target Date 05/21/21   ? ?  ?  ? ?  ? ? ? ? ? ? ? ? Plan - 04/22/21 0833   ? ? Clinical Impression Statement Pt presents to clinic reporting he has had a complete resolution of symptoms and feels much better. Pt no longer needing to strain, has had no bleeding with BMs, and has regular BMs in frequency and consistency. Pt very  pleased with results and denied additional need for PT. Pt session focused on discharge recommendations of continued mobility, reviewing educated topics for carry over of voiding and breathing mechanics and given HEP for continued mobility. Pt denied additional concerns for DC, PT encouraged pt to attempt HEP and do exercises in clinic however pt declined and denied additional needs or questions reporting he needed to get back to work. Pt very pleased with progress and PT services. Pt will be DC'd after this session.   ? Personal Factors and Comorbidities Time since onset of injury/illness/exacerbation   ? Examination-Activity Limitations Toileting   ? Stability/Clinical Decision Making Stable/Uncomplicated   ? Rehab Potential Good   ? PT Frequency 1x / week   ? PT Duration 8 weeks   ? PT Treatment/Interventions ADLs/Self Care Home Management;Functional mobility training;Therapeutic activities;Therapeutic exercise;Neuromuscular re-education;Manual techniques;Patient/family education;Passive range of motion;Energy conservation   ? PT Home Exercise Plan abdominal massage, voiding mechanics   ? Consulted and Agree with Plan of Care Patient   ? ?  ?  ? ?  ? ? ?Patient will benefit from skilled therapeutic intervention in order to improve the following deficits and impairments:  Decreased coordination, Increased fascial restricitons, Decreased mobility, Postural dysfunction, Improper body mechanics, Impaired flexibility ? ?Visit Diagnosis: ?Abnormal posture ? ?Unspecified lack of coordination ? ? ? ? ?Problem List ?Patient Active Problem List  ? Diagnosis Date Noted  ? IDA (iron deficiency anemia) 08/26/2020  ? ACS (acute coronary syndrome) ([place]) 07/09/2019  ? Elevated troponin 07/09/2019  ? GERD (gastroesophageal reflux disease) 07/09/2019  ? Hyperlipidemia 07/09/2019  ? Hyperglycemia 07/09/2019  ? Chest pain   ? ?PHYSICAL THERAPY DISCHARGE SUMMARY ? ?Visits from Start of Care: 3 ? ?Current functional level related to  goals / functional outcomes: ?All goals met ?  ?Remaining deficits: ?All goals met ?  ?Education / Equipment: ?HEP  ? ?Patient agrees to discharge. Patient goals were met. Patient is being discharged due to meeting the stated  rehab goals. ? ? ?Stacy Gardner, PT, DPT ?03/09/238:40 AM  ? ?[facility] ?[facility] ?[place], [place], [address] ?Phone: [phone]   Fax:  [phone] ? ?Name: KENYAN KARNES ?MRN: [id number] ?Date of Birth: Aug 03, 1970 ? ? ? ?

## 2021-05-03 ENCOUNTER — Encounter: Payer: BC Managed Care – PPO | Admitting: Physical Therapy

## 2021-05-17 ENCOUNTER — Encounter: Payer: BC Managed Care – PPO | Admitting: Physical Therapy

## 2021-06-08 ENCOUNTER — Telehealth: Payer: Self-pay | Admitting: Physician Assistant

## 2021-06-08 NOTE — Telephone Encounter (Signed)
Called patient regarding upcoming appointment, patient is notified. °

## 2021-06-22 ENCOUNTER — Other Ambulatory Visit: Payer: Self-pay | Admitting: Physician Assistant

## 2021-06-22 DIAGNOSIS — D508 Other iron deficiency anemias: Secondary | ICD-10-CM

## 2021-06-23 ENCOUNTER — Inpatient Hospital Stay (HOSPITAL_BASED_OUTPATIENT_CLINIC_OR_DEPARTMENT_OTHER): Payer: BC Managed Care – PPO | Admitting: Physician Assistant

## 2021-06-23 ENCOUNTER — Other Ambulatory Visit: Payer: Self-pay

## 2021-06-23 ENCOUNTER — Inpatient Hospital Stay: Payer: BC Managed Care – PPO | Attending: Physician Assistant

## 2021-06-23 VITALS — BP 104/76 | HR 80 | Temp 97.7°F | Resp 20 | Wt 178.2 lb

## 2021-06-23 DIAGNOSIS — Z79899 Other long term (current) drug therapy: Secondary | ICD-10-CM | POA: Diagnosis not present

## 2021-06-23 DIAGNOSIS — D509 Iron deficiency anemia, unspecified: Secondary | ICD-10-CM | POA: Insufficient documentation

## 2021-06-23 DIAGNOSIS — I252 Old myocardial infarction: Secondary | ICD-10-CM | POA: Insufficient documentation

## 2021-06-23 DIAGNOSIS — Z7982 Long term (current) use of aspirin: Secondary | ICD-10-CM | POA: Insufficient documentation

## 2021-06-23 DIAGNOSIS — Z7984 Long term (current) use of oral hypoglycemic drugs: Secondary | ICD-10-CM | POA: Insufficient documentation

## 2021-06-23 DIAGNOSIS — E119 Type 2 diabetes mellitus without complications: Secondary | ICD-10-CM | POA: Insufficient documentation

## 2021-06-23 DIAGNOSIS — E7800 Pure hypercholesterolemia, unspecified: Secondary | ICD-10-CM | POA: Diagnosis not present

## 2021-06-23 DIAGNOSIS — D508 Other iron deficiency anemias: Secondary | ICD-10-CM

## 2021-06-23 DIAGNOSIS — I2510 Atherosclerotic heart disease of native coronary artery without angina pectoris: Secondary | ICD-10-CM | POA: Insufficient documentation

## 2021-06-23 LAB — CBC WITH DIFFERENTIAL (CANCER CENTER ONLY)
Abs Immature Granulocytes: 0.02 10*3/uL (ref 0.00–0.07)
Basophils Absolute: 0 10*3/uL (ref 0.0–0.1)
Basophils Relative: 1 %
Eosinophils Absolute: 0.2 10*3/uL (ref 0.0–0.5)
Eosinophils Relative: 3 %
HCT: 49.6 % (ref 39.0–52.0)
Hemoglobin: 15.7 g/dL (ref 13.0–17.0)
Immature Granulocytes: 0 %
Lymphocytes Relative: 23 %
Lymphs Abs: 1.7 10*3/uL (ref 0.7–4.0)
MCH: 26.6 pg (ref 26.0–34.0)
MCHC: 31.7 g/dL (ref 30.0–36.0)
MCV: 83.9 fL (ref 80.0–100.0)
Monocytes Absolute: 0.6 10*3/uL (ref 0.1–1.0)
Monocytes Relative: 8 %
Neutro Abs: 4.9 10*3/uL (ref 1.7–7.7)
Neutrophils Relative %: 65 %
Platelet Count: 197 10*3/uL (ref 150–400)
RBC: 5.91 MIL/uL — ABNORMAL HIGH (ref 4.22–5.81)
RDW: 14.6 % (ref 11.5–15.5)
WBC Count: 7.5 10*3/uL (ref 4.0–10.5)
nRBC: 0 % (ref 0.0–0.2)

## 2021-06-23 LAB — RETIC PANEL
Immature Retic Fract: 23.1 % — ABNORMAL HIGH (ref 2.3–15.9)
RBC.: 5.84 MIL/uL — ABNORMAL HIGH (ref 4.22–5.81)
Retic Count, Absolute: 92.3 10*3/uL (ref 19.0–186.0)
Retic Ct Pct: 1.6 % (ref 0.4–3.1)
Reticulocyte Hemoglobin: 29.7 pg (ref >27.9)

## 2021-06-23 LAB — FERRITIN: Ferritin: 24 ng/mL (ref 24–336)

## 2021-06-23 LAB — IRON AND IRON BINDING CAPACITY (CC-WL,HP ONLY)
Iron: 46 ug/dL (ref 45–182)
Saturation Ratios: 10 % — ABNORMAL LOW (ref 17.9–39.5)
TIBC: 448 ug/dL (ref 250–450)
UIBC: 402 ug/dL — ABNORMAL HIGH (ref 117–376)

## 2021-06-23 NOTE — Progress Notes (Signed)
?[facility] ?[phone]   [phone] ? ?PROGRESS NOTE ? ?Patient Care Team: ?Antony Contras, MD as PCP - General (Family Medicine) ?Wilford Corner, MD as Consulting Physician (Gastroenterology) ?Michael [place], MD as Consulting Physician (General Surgery) ?Josue Hector, MD as Consulting Physician (Cardiology) ? ?Hematological/Oncological History ?1) Labs from [facility] ?-04/17/2020: WBC 11.2 (H), Hgb 9.8 (L), MCV 59.0, Plt 322. Started on iron supplementation.  ?-06/02/2020: WBC 7.2, Hgb 11.2 (L), MCV 61.5 (L), Plt 247,  ?-06/18/2020: WBC 9.8, Hgb 11.0 (L), MCV 62.0 (L), Plt 265, Ferritin 4.8 (L), TIBC 524 (H), Iron 17 (L), Iron saturation 3% (L), Transferrin 374 (H) ?-08/04/2020: WBC 7.4, Hgb 12.5 (L), MCV 66.4 (L), Plt 238, Ferritin 12.1 (L), TIBC 484 (H), Iron 20 (L), Iron saturation 4% (L), Transferrin 346 (H) ? ?2) 08/26/2020: Establish care with Dede Query PA-C ? ?3) 09/07/2020-8/08/222: Received IV venofer x 3 doses.  ? ?CHIEF COMPLAINTS/PURPOSE OF CONSULTATION:  ?"Iron deficiency anemia " ? ?HISTORY OF PRESENTING ILLNESS:  ?Shawn Landry 51 y.o. male returns today for follow-up for iron deficiency anemia.  Patient is unaccompanied for this visit.   ? ?On exam today, Mr. Paff reports his energy levels are fairly stable.  He continues to complete all his daily activities on his own.  He denies any dietary changes but adds that he is eating more fiber.  He has noticed improvement of hematochezia with less frequent steroids and decreased volume of blood in the stools.  He adds that he is no longer straining with a bowel movement.  He denies nausea, vomiting, abdominal pain, diarrhea or constipation.  He denies any fevers, chills, night sweats, shortness of breath, chest pain or cough.  He has no other complaints.  Rest of the 10 point ROS is below. ? ? ?MEDICAL HISTORY:  ?Past Medical History:  ?Diagnosis Date  ? Anemia   ? Anxiety   ? Coronary artery  disease   ? Diabetes mellitus without complication ([place])   ? type 2  ? GERD (gastroesophageal reflux disease)   ? Heat intolerance   ? High cholesterol   ? History of heart attack   ? Hyperlipidemia   ? Neuropathy   ? ? ?SURGICAL HISTORY: ?Past Surgical History:  ?Procedure Laterality Date  ? CORONARY STENT INTERVENTION  07/09/2019  ? CORONARY STENT INTERVENTION N/A 07/09/2019  ? Procedure: CORONARY STENT INTERVENTION;  Surgeon: Belva Crome, MD;  Location: [facility];  Service: Cardiovascular;  Laterality: N/A;  ? LEFT HEART CATH AND CORONARY ANGIOGRAPHY N/A 07/09/2019  ? Procedure: LEFT HEART CATH AND CORONARY ANGIOGRAPHY;  Surgeon: Belva Crome, MD;  Location: [facility];  Service: Cardiovascular;  Laterality: N/A;  ? ROTATOR CUFF REPAIR Right   ? ROTATOR CUFF REPAIR Right 2010  ? ? ?SOCIAL HISTORY: ?Social History  ? ?Socioeconomic History  ? Marital status: Single  ?  Spouse name: Not on file  ? Number of children: 0  ? Years of education: Not on file  ? Highest education level: Bachelor's degree (e.g., BA, AB, BS)  ?Occupational History  ?  Comment: Financial planner  ?Tobacco Use  ? Smoking status: Former  ?  Types: Cigarettes  ?  Quit date: 2010  ?  Years since quitting: 13.3  ? Smokeless tobacco: Never  ?Vaping Use  ? Vaping Use: Never used  ?Substance and Sexual Activity  ? Alcohol use: Yes  ?  Comment: rare  ? Drug use: Never  ?  Sexual activity: Not on file  ?Other Topics Concern  ? Not on file  ?Social History Narrative  ? Lives alone  ? Caffeine 1-2 c daily  ? ?Social Determinants of Health  ? ?Financial Resource Strain: Not on file  ?Food Insecurity: Not on file  ?Transportation Needs: Not on file  ?Physical Activity: Not on file  ?Stress: Not on file  ?Social Connections: Not on file  ?Intimate Partner Violence: Not on file  ? ? ?FAMILY HISTORY: ?Family History  ?Problem Relation Age of Onset  ? Sudden Cardiac Death Brother 63  ? Breast cancer Mother   ? ? ?ALLERGIES:  has No Known  Allergies. ? ?MEDICATIONS:  ?Current Outpatient Medications  ?Medication Sig Dispense Refill  ? aspirin EC 81 MG tablet Take 81 mg by mouth daily.    ? atorvastatin (LIPITOR) 80 MG tablet TAKE 1 TABLET BY MOUTH EVERY DAY 90 tablet 3  ? blood glucose meter kit and supplies Dispense based on patient and insurance preference. Use up to four times daily as directed. (FOR ICD-10 E10.9, E11.9). 1 each 0  ? Continuous Blood Gluc Sensor (FREESTYLE LIBRE 2 SENSOR) MISC Use every 14 days 2 each 1  ? empagliflozin (JARDIANCE) 10 MG TABS tablet Take 1 tablet (10 mg total) by mouth daily before breakfast. 30 tablet 3  ? FERROUS SULFATE PO Take 325 mg by mouth daily.    ? metoprolol tartrate (LOPRESSOR) 50 MG tablet Take 1.5 tablets (75 mg total) by mouth 2 (two) times daily. 270 tablet 3  ? nitroGLYCERIN (NITROSTAT) 0.4 MG SL tablet Place 1 tablet (0.4 mg total) under the tongue every 5 (five) minutes as needed for chest pain. Please schedule yearly appointment for future refills. Thank you 25 tablet 2  ? pantoprazole (PROTONIX) 40 MG tablet Take 40 mg by mouth daily.    ? metFORMIN (GLUCOPHAGE) 500 MG tablet Take 1 tablet (500 mg total) by mouth 2 (two) times daily with a meal. 60 tablet 1  ? ?No current facility-administered medications for this visit.  ? ? ?REVIEW OF SYSTEMS:   ?Constitutional: ( - ) fevers, ( - )  chills , ( - ) night sweats ?Eyes: ( - ) blurriness of vision, ( - ) double vision, ( - ) watery eyes ?Ears, nose, mouth, throat, and face: ( - ) mucositis, ( - ) sore throat ?Respiratory: ( - ) cough, ( - ) dyspnea, ( - ) wheezes ?Cardiovascular: ( - ) palpitation, ( - ) chest discomfort, ( - ) lower extremity swelling ?Gastrointestinal:  ( - ) nausea, ( - ) heartburn, ( - ) change in bowel habits ?Skin: ( - ) abnormal skin rashes ?Lymphatics: ( - ) new lymphadenopathy, ( - ) easy bruising ?Neurological: ( - ) numbness, ( - ) tingling, ( - ) new weaknesses ?Behavioral/Psych: ( - ) mood change, ( - ) new changes   ?All other systems were reviewed with the patient and are negative. ? ?PHYSICAL EXAMINATION: ?ECOG PERFORMANCE STATUS: 0 - Asymptomatic ? ?Vitals:  ? 06/23/21 1024  ?BP: 104/76  ?Pulse: 80  ?Resp: 20  ?Temp: 97.7 ?F (36.5 ?C)  ?SpO2: 99%  ? ?Filed Weights  ? 06/23/21 1024  ?Weight: 178 lb 3.2 oz (80.8 kg)  ? ? ?GENERAL: well appearing male in NAD  ?SKIN: skin color, texture, turgor are normal, no rashes or significant lesions ?EYES: conjunctiva are pink and non-injected, sclera clear ?OROPHARYNX: no exudate, no erythema; lips, buccal mucosa, and tongue normal  ?LUNGS:  clear to auscultation and percussion with normal breathing effort ?HEART: regular rate & rhythm and no murmurs and no lower extremity edema ?Musculoskeletal: no cyanosis of digits and no clubbing  ?PSYCH: alert & oriented x 3, fluent speech ?NEURO: no focal motor/sensory deficits ? ?LABORATORY DATA:  ?I have reviewed the data as listed ? ?  Latest Ref Rng & Units 06/23/2021  ?  9:28 AM 12/23/2020  ?  7:45 AM 10/20/2020  ?  1:01 PM  ?CBC  ?WBC 4.0 - 10.5 K/uL 7.5   7.2   8.0    ?Hemoglobin 13.0 - 17.0 g/dL 15.7   14.9   14.6    ?Hematocrit 39.0 - 52.0 % 49.6   48.2   49.5    ?Platelets 150 - 400 K/uL 197   218   243    ? ? ? ?  Latest Ref Rng & Units 08/26/2020  ?  9:46 AM 08/30/2019  ?  9:07 AM 07/11/2019  ?  7:10 AM  ?CMP  ?Glucose 70 - 99 mg/dL 124    192    ?BUN 6 - 20 mg/dL 12    9    ?Creatinine 0.61 - 1.24 mg/dL 0.80    0.69    ?Sodium 135 - 145 mmol/L 141    135    ?Potassium 3.5 - 5.1 mmol/L 4.2    3.7    ?Chloride 98 - 111 mmol/L 105    103    ?CO2 22 - 32 mmol/L 27    23    ?Calcium 8.9 - 10.3 mg/dL 8.9    8.4    ?Total Protein 6.5 - 8.1 g/dL 7.4   6.8     ?Total Bilirubin 0.3 - 1.2 mg/dL 0.5   0.4     ?Alkaline Phos 38 - 126 U/L 102   139     ?AST 15 - 41 U/L 17   19     ?ALT 0 - 44 U/L 15   22     ? ?ASSESSMENT & PLAN ?ABB GOBERT is a 51 y.o. male who returns for a follow up for iron deficiency anemia ? ?#Iron deficiency anemia, suspect  secondary to GI bleed with hemorrhoids ?--Patient is under the care of [facility]. EGD and colonoscopy were done last week on 08/18/2020. Findings included internal hemorrhoids, non-thrombosed external hemorrhoids.

## 2021-08-05 ENCOUNTER — Other Ambulatory Visit: Payer: Self-pay | Admitting: Cardiology

## 2021-08-27 ENCOUNTER — Other Ambulatory Visit: Payer: Self-pay | Admitting: Cardiovascular Disease

## 2021-08-30 ENCOUNTER — Telehealth: Payer: Self-pay | Admitting: Cardiovascular Disease

## 2021-08-30 MED ORDER — ATORVASTATIN CALCIUM 80 MG PO TABS: 80.0000 mg | ORAL_TABLET | Freq: Every day | ORAL | 0 refills | Status: DC

## 2021-08-30 NOTE — Telephone Encounter (Signed)
Pt's medication was sent to pt's pharmacy as requested. Confirmation received.  °

## 2021-08-30 NOTE — Telephone Encounter (Signed)
 *  STAT* If patient is at the pharmacy, call can be transferred to refill team.   1. Which medications need to be refilled? (please list name of each medication and dose if known)   atorvastatin (LIPITOR) 80 MG tablet  2. Which pharmacy/location (including street and city if local pharmacy) is medication to be sent to?  [facility] - [place], [place] - [address]  3. Do they need a 30 day or 90 day supply?  90  Patient has an appointment with Dr Eden Emms on 11/19/21

## 2021-10-11 ENCOUNTER — Encounter: Payer: Self-pay | Admitting: Cardiovascular Disease

## 2021-10-11 ENCOUNTER — Ambulatory Visit: Payer: BC Managed Care – PPO | Attending: Cardiovascular Disease | Admitting: Cardiovascular Disease

## 2021-10-11 VITALS — BP 100/68 | HR 71 | Ht 65.5 in | Wt 174.0 lb

## 2021-10-11 DIAGNOSIS — Z9861 Coronary angioplasty status: Secondary | ICD-10-CM | POA: Diagnosis not present

## 2021-10-11 DIAGNOSIS — E1140 Type 2 diabetes mellitus with diabetic neuropathy, unspecified: Secondary | ICD-10-CM

## 2021-10-11 DIAGNOSIS — I2510 Atherosclerotic heart disease of native coronary artery without angina pectoris: Secondary | ICD-10-CM

## 2021-10-11 DIAGNOSIS — E782 Mixed hyperlipidemia: Secondary | ICD-10-CM

## 2021-10-11 MED ORDER — ATORVASTATIN CALCIUM 80 MG PO TABS: 80.0000 mg | ORAL_TABLET | Freq: Every day | ORAL | 3 refills | Status: DC

## 2021-10-11 NOTE — Patient Instructions (Signed)
Medication Instructions:  Your physician recommends that you continue on your current medications as directed. Please refer to the Current Medication list given to you today.  *If you need a refill on your cardiac medications before your next appointment, please call your pharmacy*  Lab Work: If you have labs (blood work) drawn today and your tests are completely normal, you will receive your results only by: MyChart Message (if you have MyChart) OR A paper copy in the mail If you have any lab test that is abnormal or we need to change your treatment, we will call you to review the results.  Testing/Procedures: None ordered today.  Follow-Up: At [facility], you and your health needs are our priority.  As part of our continuing mission to provide you with exceptional heart care, we have created designated Provider Care Teams.  These Care Teams include your primary Cardiologist (physician) and Advanced Practice Providers (APPs -  Physician Assistants and Nurse Practitioners) who all work together to provide you with the care you need, when you need it.  We recommend signing up for the patient portal called "MyChart".  Sign up information is provided on this After Visit Summary.  MyChart is used to connect with patients for Virtual Visits (Telemedicine).  Patients are able to view lab/test results, encounter notes, upcoming appointments, etc.  Non-urgent messages can be sent to your provider as well.   To learn more about what you can do with MyChart, go to [email or web address].    Your next appointment:   1 year(s)  The format for your next appointment:   In Person  Provider:   Peter Nishan, MD     Important Information About Sugar       

## 2021-10-11 NOTE — Progress Notes (Signed)
Cardiology Office Note   Date:  10/11/2021   ID:  ACEL Shawn Landry, DOB 1971-02-11, MRN [id number]  PCP:  Shawn Contras, MD  Cardiologist: Dr. Johnsie Cancel, MD   No chief complaint on file.   History of Present Illness: Shawn Landry is a 51 y.o. male has a history of hyperlipidemia and GERD who presented to [facility] on 07/09/2019 for the evaluation of chest pain.Troponin levels found to be elevated from 23>> 155>> 420. LHC  on 07/09/2019 with proximal OM stenosis with DES/PCI placement.  Also noted to have 50% mid LAD, moderate to severe proximal diagonal disease in 1 and 2 and proximal to mid RCA disease at 50% all of which are to be treated medically for now. Follow-up echocardiogram 07/10/19  showing EF  at 50 to 55% with no valvular disease.  He has been compliant with his medications including ASA   Denies chest pain, shortness of breath, LE edema, orthopnea, PND, dizziness or syncope. 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[employer] Just got back from [place] Lives alone  Recent kayaking trip    Past Medical History:  Diagnosis Date   Anemia    Anxiety    Coronary artery disease    Diabetes mellitus without complication (HCC)    type 2   GERD (gastroesophageal reflux disease)    Heat intolerance    High cholesterol    History of heart attack    Hyperlipidemia    Neuropathy     Past Surgical History:  Procedure Laterality Date   CORONARY STENT INTERVENTION  07/09/2019   CORONARY STENT INTERVENTION N/A 07/09/2019   Procedure: CORONARY STENT INTERVENTION;  Surgeon: Shawn Crome, MD;  Location: [place] [facility];  Service: Cardiovascular;  Laterality: N/A;   LEFT HEART CATH AND CORONARY ANGIOGRAPHY N/A 07/09/2019   Procedure: LEFT HEART CATH AND CORONARY ANGIOGRAPHY;  Surgeon: Shawn Crome, MD;  Location: [place] [facility];  Service:  Cardiovascular;  Laterality: N/A;   ROTATOR CUFF REPAIR Right    ROTATOR CUFF REPAIR Right 2010     Current Outpatient Medications  Medication Sig Dispense Refill   aspirin EC 81 MG tablet Take 81 mg by mouth daily.     blood glucose meter kit and supplies Dispense based on patient and insurance preference. Use up to four times daily as directed. (FOR ICD-10 E10.9, E11.9). 1 each 0   Continuous Blood Gluc Sensor (FREESTYLE LIBRE 2 SENSOR) MISC Use every 14 days 2 each 1   empagliflozin (JARDIANCE) 10 MG TABS tablet Take 1 tablet (10 mg total) by mouth daily before breakfast. 30 tablet 3   FERROUS SULFATE PO Take 325 mg by mouth daily.     metFORMIN (GLUCOPHAGE) 500 MG tablet Take 1 tablet (500 mg total) by mouth 2 (two) times daily with a meal. 60 tablet 1   Metoprolol Tartrate 75 MG TABS Take 1 tablet by mouth 2 (two) times daily.     nitroGLYCERIN (NITROSTAT) 0.4 MG SL tablet Place 1 tablet (0.4 mg total) under the tongue every 5 (five) minutes as needed for chest pain. Please schedule yearly appointment for future refills. Thank you 25 tablet 2   pantoprazole (PROTONIX) 40 MG tablet Take 40 mg by mouth daily.     atorvastatin (LIPITOR) 80 MG tablet Take 1 tablet (80  mg total) by mouth daily. 90 tablet 3   No current facility-administered medications for this visit.    Allergies:   Patient has no known allergies.    Social History:  The patient  reports that he quit smoking about 13 years ago. His smoking use included cigarettes. He has never used smokeless tobacco. He reports current alcohol use. He reports that he does not use drugs.   Family History:  The patient's family history includes Breast cancer in his mother; Sudden Cardiac Death (age of onset: 31) in his brother.    ROS:  Please see the history of present illness.   Otherwise, review of systems are positive for none. All other systems are reviewed and negative.    PHYSICAL EXAM: VS:  BP 100/68   Pulse 71   Ht 5' 5.5"  (1.664 m)   Wt 174 lb (78.9 kg)   SpO2 94%   BMI 28.51 kg/m  , BMI Body mass index is 28.51 kg/m.   Affect appropriate Healthy:  appears stated age 54: normal Neck supple with no adenopathy JVP normal no bruits no thyromegaly Lungs clear with no wheezing and good diaphragmatic motion Heart:  S1/S2 no murmur, no rub, gallop or click PMI normal Abdomen: benighn, BS positve, no tenderness, no AAA no bruit.  No HSM or HJR Distal pulses intact with no bruits No edema Neuro non-focal Eczema on LE / shins  No muscular weakness   EKG:   10/11/2021 SR rate 108 old IMI   Recent Labs: 06/23/2021: Hemoglobin 15.7; Platelet Count 197    Lipid Panel    Component Value Date/Time   CHOL 128 08/30/2019 0907   TRIG 127 08/30/2019 0907   HDL 39 (L) 08/30/2019 0907   CHOLHDL 3.3 08/30/2019 0907   CHOLHDL 5.5 07/09/2019 0520   VLDL 15 07/09/2019 0520   LDLCALC 66 08/30/2019 0907    Wt Readings from Last 3 Encounters:  10/11/21 174 lb (78.9 kg)  06/23/21 178 lb 3.2 oz (80.8 kg)  12/23/20 179 lb 9.6 oz (81.5 kg)    Other studies Reviewed: Additional studies/ records that were reviewed today include: Review of the above records demonstrates:  Shawn Landry - [place] 07/09/2019:  A stent was successfully placed.   Non-ST elevation myocardial infarction, late presenting, involving the proximal portion of the large obtuse marginal that supplies the lateral and inferoapical wall. Successful PCI with stent implantation reducing 100% stenosis with TIMI grade 0 flow to 0% stenosis and TIMI grade III flow. Patent left main  50% mid LAD disease and moderately severe proximal diagonal 1 and diagonal 2 disease (better treated with medication). Diffuse luminal irregularities from proximal to distal RCA with proximal to mid 50 % narrowing. Inferoapical moderate hypokinesis.  EF 45 to 50%.  LVEDP 17 mmHg.   RECOMMENDATIONS:   Aspirin and Brilinta x12 months Aggressive risk factor modification including 50%  reduction in LDL with target less than 70.  Aerobic exercise and consideration of cardiac rehab. Hemoglobin A1c. Continue to cycle cardiac markers.   Echocardiogram 07/09/2019:   1. Left ventricular ejection fraction, by estimation, is 50 to 55%. The  left ventricle has low normal function. The left ventricle demonstrates  regional wall motion abnormalities (see scoring diagram/findings for  description). Left ventricular diastolic   parameters were normal.   2. Right ventricular systolic function is normal. The right ventricular  size is normal. Tricuspid regurgitation signal is inadequate for assessing  PA pressure.   3. The mitral valve is grossly normal. No  evidence of mitral valve  regurgitation. No evidence of mitral stenosis.   4. The aortic valve is tricuspid. Aortic valve regurgitation is not  visualized. No aortic stenosis is present.   5. The inferior vena cava is dilated in size with >50% respiratory  variability, suggesting right atrial pressure of 8 mmHg.   ASSESSMENT AND PLAN:  1. CAD s/p NSTEMI: -LHC 07/09/19  with 100% stenosis in the proximal OM s/p DES/PCI.  Also noted to have 50% mid LAD stenosis, moderate to severe proximal diagonal disease in 1 and 2 as well as proximal to mid RCA stenosis at 50%>> to treat medically -Plan is for DAPT with ASA and Brilinta May 2022  tolerating well without signs or symptoms of hematoma or bleeding -Continue metoprolol, aspirin, Brilinta, statin -Echocardiogram EF 58-59%, LV diastolic parameters were normal -continue with cardiac rehab    2. New onset diabetes mellitus, type II with hyperglycemia: -Hemoglobin A1c 11.3, no prior for comparison -Patient discharged on Jardiance and Metformin>> needs follow-up with PCP Last A1c improved 7.2    3. Hyperlipidemia -Continue statin>> on RX LDL at goal labs with primary   4. GERD -no longer needing PPI  5. Neuropathy:  F/u neurology related to diabetes   6. Derm:  ? Eczema on LEls  suggested he see dermatology    Current medicines are reviewed at length with the patient today.  The patient does not have concerns regarding medicines.  The following changes have been made:  no change  Labs/ tests ordered today include: none   Orders Placed This Encounter  Procedures   EKG 12-Lead    Disposition:   FU with me in 6 months   Signed, Jenkins Rouge, MD  10/11/2021 9:33 AM    [facility] [place], [place], [place]  [address] Phone: [phone]; Fax: [phone]

## 2021-11-19 ENCOUNTER — Ambulatory Visit: Payer: BC Managed Care – PPO | Admitting: Cardiovascular Disease

## 2021-12-09 ENCOUNTER — Encounter: Payer: Self-pay | Admitting: Cardiovascular Disease

## 2021-12-09 MED ORDER — NITROGLYCERIN 0.4 MG SL SUBL: 0.4000 mg | SUBLINGUAL_TABLET | SUBLINGUAL | 11 refills | Status: AC | PRN

## 2021-12-24 ENCOUNTER — Telehealth: Payer: Self-pay

## 2021-12-24 ENCOUNTER — Inpatient Hospital Stay: Payer: BC Managed Care – PPO | Attending: Physician Assistant

## 2021-12-24 ENCOUNTER — Inpatient Hospital Stay (HOSPITAL_BASED_OUTPATIENT_CLINIC_OR_DEPARTMENT_OTHER): Payer: BC Managed Care – PPO | Admitting: Physician Assistant

## 2021-12-24 VITALS — BP 107/85 | HR 70 | Temp 97.6°F | Resp 15 | Wt 175.9 lb

## 2021-12-24 DIAGNOSIS — Z862 Personal history of diseases of the blood and blood-forming organs and certain disorders involving the immune mechanism: Secondary | ICD-10-CM | POA: Insufficient documentation

## 2021-12-24 DIAGNOSIS — D508 Other iron deficiency anemias: Secondary | ICD-10-CM | POA: Diagnosis not present

## 2021-12-24 DIAGNOSIS — D509 Iron deficiency anemia, unspecified: Secondary | ICD-10-CM | POA: Diagnosis present

## 2021-12-24 DIAGNOSIS — Z87891 Personal history of nicotine dependence: Secondary | ICD-10-CM | POA: Insufficient documentation

## 2021-12-24 DIAGNOSIS — E611 Iron deficiency: Secondary | ICD-10-CM | POA: Diagnosis present

## 2021-12-24 DIAGNOSIS — Z7982 Long term (current) use of aspirin: Secondary | ICD-10-CM | POA: Insufficient documentation

## 2021-12-24 DIAGNOSIS — E119 Type 2 diabetes mellitus without complications: Secondary | ICD-10-CM | POA: Diagnosis not present

## 2021-12-24 DIAGNOSIS — Z79899 Other long term (current) drug therapy: Secondary | ICD-10-CM | POA: Diagnosis not present

## 2021-12-24 DIAGNOSIS — I2510 Atherosclerotic heart disease of native coronary artery without angina pectoris: Secondary | ICD-10-CM | POA: Diagnosis not present

## 2021-12-24 DIAGNOSIS — I252 Old myocardial infarction: Secondary | ICD-10-CM | POA: Diagnosis not present

## 2021-12-24 DIAGNOSIS — R195 Other fecal abnormalities: Secondary | ICD-10-CM | POA: Diagnosis not present

## 2021-12-24 DIAGNOSIS — R197 Diarrhea, unspecified: Secondary | ICD-10-CM | POA: Insufficient documentation

## 2021-12-24 DIAGNOSIS — Z7984 Long term (current) use of oral hypoglycemic drugs: Secondary | ICD-10-CM | POA: Insufficient documentation

## 2021-12-24 LAB — CBC WITH DIFFERENTIAL (CANCER CENTER ONLY)
Abs Immature Granulocytes: 0.02 10*3/uL (ref 0.00–0.07)
Basophils Absolute: 0 10*3/uL (ref 0.0–0.1)
Basophils Relative: 0 %
Eosinophils Absolute: 0.3 10*3/uL (ref 0.0–0.5)
Eosinophils Relative: 3 %
HCT: 49.4 % (ref 39.0–52.0)
Hemoglobin: 15.7 g/dL (ref 13.0–17.0)
Immature Granulocytes: 0 %
Lymphocytes Relative: 22 %
Lymphs Abs: 2.1 10*3/uL (ref 0.7–4.0)
MCH: 27.4 pg (ref 26.0–34.0)
MCHC: 31.8 g/dL (ref 30.0–36.0)
MCV: 86.2 fL (ref 80.0–100.0)
Monocytes Absolute: 0.9 10*3/uL (ref 0.1–1.0)
Monocytes Relative: 9 %
Neutro Abs: 6.1 10*3/uL (ref 1.7–7.7)
Neutrophils Relative %: 66 %
Platelet Count: 235 10*3/uL (ref 150–400)
RBC: 5.73 MIL/uL (ref 4.22–5.81)
RDW: 13.7 % (ref 11.5–15.5)
WBC Count: 9.4 10*3/uL (ref 4.0–10.5)
nRBC: 0 % (ref 0.0–0.2)

## 2021-12-24 LAB — FERRITIN: Ferritin: 18 ng/mL — ABNORMAL LOW (ref 24–336)

## 2021-12-24 LAB — IRON AND IRON BINDING CAPACITY (CC-WL,HP ONLY)
Iron: 37 ug/dL — ABNORMAL LOW (ref 45–182)
Saturation Ratios: 9 % — ABNORMAL LOW (ref 17.9–39.5)
TIBC: 434 ug/dL (ref 250–450)
UIBC: 397 ug/dL — ABNORMAL HIGH (ref 117–376)

## 2021-12-24 NOTE — Telephone Encounter (Signed)
-----   Message from Briant Cedar, PA-C sent at 12/24/2021  2:30 PM EST ----- Please notify patient iron levels declined so recommend IV iron to help improve levels. Continue iron pills. See back in 3 months for lab check and f/u visit in 6 months. Please recommend he follow up with GI team.

## 2021-12-24 NOTE — Telephone Encounter (Signed)
Pt advised of lab results and recommendations.  He is in agreement with this plan.  He will contact his GI Dr for a f/up appt.

## 2021-12-24 NOTE — Progress Notes (Signed)
[place] [phone]   [phone]  PROGRESS NOTE  Patient Care Team: Antony Contras, MD as PCP - General (Family Medicine) Josue Hector, MD as PCP - Cardiology (Cardiology) Wilford Corner, MD as Consulting Physician (Gastroenterology) Michael Boston, MD as Consulting Physician (General Surgery) Josue Hector, MD as Consulting Physician (Cardiology)  Hematological/Oncological History 1) Labs from [facility], [facility] -04/17/2020: WBC 11.2 (H), Hgb 9.8 (L), MCV 59.0, Plt 322. Started on iron supplementation.  -06/02/2020: WBC 7.2, Hgb 11.2 (L), MCV 61.5 (L), Plt 247,  -06/18/2020: WBC 9.8, Hgb 11.0 (L), MCV 62.0 (L), Plt 265, Ferritin 4.8 (L), TIBC 524 (H), Iron 17 (L), Iron saturation 3% (L), Transferrin 374 (H) -08/04/2020: WBC 7.4, Hgb 12.5 (L), MCV 66.4 (L), Plt 238, Ferritin 12.1 (L), TIBC 484 (H), Iron 20 (L), Iron saturation 4% (L), Transferrin 346 (H)  2) 08/26/2020: Establish care with Dede Query PA-C  3) 09/07/2020-8/08/222: Received IV venofer x 3 doses.   CHIEF COMPLAINTS/PURPOSE OF CONSULTATION:  "Iron deficiency anemia "  HISTORY OF PRESENTING ILLNESS:  Shawn Landry 51 y.o. male returns today for follow-up for iron deficiency anemia. He was last seen on 06/23/2021. In the interim, he denies any changes to his health. Patient is unaccompanied for this visit.    On exam today, Shawn Landry reports stable energy levels and appetite.  He continues to complete all his daily activities on his own but would like to incorporate exercise into his routine.  He has a sedentary job as a Financial planner.  He denies any nausea, vomiting or abdominal pain.  He reports pain from his hemorrhoids have resolved.  He adds that in the last 2 months he has noticed more watery stools.  He denies any diet changes or new medications.  He denies easy bruising or signs of active bleeding.  He denies any fevers, chills, night sweats, shortness of breath,  chest pain or cough.  He has no other complaints.  Rest of the 10 point ROS is below.   MEDICAL HISTORY:  Past Medical History:  Diagnosis Date   Anemia    Anxiety    Coronary artery disease    Diabetes mellitus without complication (HCC)    type 2   GERD (gastroesophageal reflux disease)    Heat intolerance    High cholesterol    History of heart attack    Hyperlipidemia    Neuropathy     SURGICAL HISTORY: Past Surgical History:  Procedure Laterality Date   CORONARY STENT INTERVENTION  07/09/2019   CORONARY STENT INTERVENTION N/A 07/09/2019   Procedure: CORONARY STENT INTERVENTION;  Surgeon: Belva Crome, MD;  Location: [place] [facility];  Service: Cardiovascular;  Laterality: N/A;   LEFT HEART CATH AND CORONARY ANGIOGRAPHY N/A 07/09/2019   Procedure: LEFT HEART CATH AND CORONARY ANGIOGRAPHY;  Surgeon: Belva Crome, MD;  Location: [place] [facility];  Service: Cardiovascular;  Laterality: N/A;   ROTATOR CUFF REPAIR Right    ROTATOR CUFF REPAIR Right 2010    SOCIAL HISTORY: Social History   Socioeconomic History   Marital status: Single    Spouse name: Not on file   Number of children: 0   Years of education: Not on file   Highest education level: Bachelor's degree (e.g., BA, AB, BS)  Occupational History    Comment: Financial planner  Tobacco Use   Smoking status: Former    Types: Cigarettes    Quit date: 2010  Years since quitting: 13.8   Smokeless tobacco: Never  Vaping Use   Vaping Use: Never used  Substance and Sexual Activity   Alcohol use: Yes    Comment: rare   Drug use: Never   Sexual activity: Not on file  Other Topics Concern   Not on file  Social History Narrative   Lives alone   Caffeine 1-2 c daily   Social Determinants of Health   Financial Resource Strain: Not on file  Food Insecurity: Not on file  Transportation Needs: Not on file  Physical Activity: Not on file  Stress: Not on file  Social Connections: Not on file  Intimate  Partner Violence: Not on file    FAMILY HISTORY: Family History  Problem Relation Age of Onset   Sudden Cardiac Death Brother 25   Breast cancer Mother     ALLERGIES:  has No Known Allergies.  MEDICATIONS:  Current Outpatient Medications  Medication Sig Dispense Refill   aspirin EC 81 MG tablet Take 81 mg by mouth daily.     atorvastatin (LIPITOR) 80 MG tablet Take 1 tablet (80 mg total) by mouth daily. 90 tablet 3   blood glucose meter kit and supplies Dispense based on patient and insurance preference. Use up to four times daily as directed. (FOR ICD-10 E10.9, E11.9). 1 each 0   Continuous Blood Gluc Sensor (FREESTYLE LIBRE 2 SENSOR) MISC Use every 14 days 2 each 1   empagliflozin (JARDIANCE) 10 MG TABS tablet Take 1 tablet (10 mg total) by mouth daily before breakfast. 30 tablet 3   FERROUS SULFATE PO Take 325 mg by mouth daily.     levothyroxine (SYNTHROID) 88 MCG tablet Take 88 mcg by mouth daily before breakfast.     Metoprolol Tartrate 75 MG TABS Take 1 tablet by mouth 2 (two) times daily.     nitroGLYCERIN (NITROSTAT) 0.4 MG SL tablet Place 1 tablet (0.4 mg total) under the tongue every 5 (five) minutes as needed for chest pain. 25 tablet 11   pantoprazole (PROTONIX) 40 MG tablet Take 40 mg by mouth daily.     metFORMIN (GLUCOPHAGE) 500 MG tablet Take 1 tablet (500 mg total) by mouth 2 (two) times daily with a meal. 60 tablet 1   No current facility-administered medications for this visit.    REVIEW OF SYSTEMS:   Constitutional: ( - ) fevers, ( - )  chills , ( - ) night sweats Eyes: ( - ) blurriness of vision, ( - ) double vision, ( - ) watery eyes Ears, nose, mouth, throat, and face: ( - ) mucositis, ( - ) sore throat Respiratory: ( - ) cough, ( - ) dyspnea, ( - ) wheezes Cardiovascular: ( - ) palpitation, ( - ) chest discomfort, ( - ) lower extremity swelling Gastrointestinal:  ( - ) nausea, ( - ) heartburn, ( + ) change in bowel habits Skin: ( - ) abnormal skin  rashes Lymphatics: ( - ) new lymphadenopathy, ( - ) easy bruising Neurological: ( - ) numbness, ( - ) tingling, ( - ) new weaknesses Behavioral/Psych: ( - ) mood change, ( - ) new changes  All other systems were reviewed with the patient and are negative.  PHYSICAL EXAMINATION: ECOG PERFORMANCE STATUS: 0 - Asymptomatic  Vitals:   12/24/21 0819  BP: 107/85  Pulse: 70  Resp: 15  Temp: 97.6 F (36.4 C)  SpO2: 98%   Filed Weights   12/24/21 0819  Weight: 175 lb 14.4 oz (  79.8 kg)    GENERAL: well appearing male in NAD  SKIN: skin color, texture, turgor are normal, no rashes or significant lesions EYES: conjunctiva are pink and non-injected, sclera clear OROPHARYNX: no exudate, no erythema; lips, buccal mucosa, and tongue normal  LUNGS: clear to auscultation and percussion with normal breathing effort HEART: regular rate & rhythm and no murmurs and no lower extremity edema Musculoskeletal: no cyanosis of digits and no clubbing  PSYCH: alert & oriented x 3, fluent speech NEURO: no focal motor/sensory deficits  LABORATORY DATA:  I have reviewed the data as listed    Latest Ref Rng & Units 12/24/2021    7:44 AM 06/23/2021    9:28 AM 12/23/2020    7:45 AM  CBC  WBC 4.0 - 10.5 K/uL 9.4  7.5  7.2   Hemoglobin 13.0 - 17.0 g/dL 15.7  15.7  14.9   Hematocrit 39.0 - 52.0 % 49.4  49.6  48.2   Platelets 150 - 400 K/uL 235  197  218        Latest Ref Rng & Units 08/26/2020    9:46 AM 08/30/2019    9:07 AM 07/11/2019    7:10 AM  CMP  Glucose 70 - 99 mg/dL 124   192   BUN 6 - 20 mg/dL 12   9   Creatinine 0.61 - 1.24 mg/dL 0.80   0.69   Sodium 135 - 145 mmol/L 141   135   Potassium 3.5 - 5.1 mmol/L 4.2   3.7   Chloride 98 - 111 mmol/L 105   103   CO2 22 - 32 mmol/L 27   23   Calcium 8.9 - 10.3 mg/dL 8.9   8.4   Total Protein 6.5 - 8.1 g/dL 7.4  6.8    Total Bilirubin 0.3 - 1.2 mg/dL 0.5  0.4    Alkaline Phos 38 - 126 U/L 102  139    AST 15 - 41 U/L 17  19    ALT 0 - 44 U/L 15   22     ASSESSMENT & PLAN ZURIEL ROSKOS is a 51 y.o. male who returns for a follow up for iron deficiency anemia  #Iron deficiency anemia, suspect secondary to GI bleed with hemorrhoids --Patient is under the care of [facility]. EGD and colonoscopy were done on 08/18/2020. Findings included internal hemorrhoids, non-thrombosed external hemorrhoids.  --Patient was evaluated by general surgery (Dr.Steven Gross from [facility]) on 01/11/2021 to discuss hemorrhoidectomy. Patient has elected to hold off on surgical intervention.  --Patient received IV venofer x 3 doses from 09/07/2020-09/21/2020.  --Labs today show no evidence of anemia with a hemoglobin of 15.7, MCV 86.2. Iron panel shows persistent iron deficiency with serum iron 37, saturation 9%, ferritin 18.  --Recommend IV iron to help bolster iron levels.  --Recommend to continue to incorporate iron rich foods into his diet.  --Recommend to continue to take ferrous sulfate 325 mg once daily with a source of vitamin C.  --RTC 3 months for lab only check and 6 months for a follow up visit.    #Watery stools: --Encouraged patient to follow up with GI team for further evaluation including possible stool studies.  --Advised to stay hydrated.   No orders of the defined types were placed in this encounter.   All questions were answered. The patient knows to call the clinic with any problems, questions or concerns.  I have spent a total of 30 minutes minutes of face-to-face and non-face-to-face  time, preparing to see the patient, performing a medically appropriate examination, counseling and educating the patient, documenting clinical information in the electronic health record, and care coordination.    Dede Query, PA-C Department of Hematology/Oncology [place] at [facility] Phone: [phone]

## 2021-12-27 ENCOUNTER — Telehealth: Payer: Self-pay | Admitting: Physician Assistant

## 2021-12-27 NOTE — Telephone Encounter (Signed)
Per 11/10 los called and spoke to pt about appointments  

## 2022-01-07 ENCOUNTER — Other Ambulatory Visit: Payer: Self-pay

## 2022-01-07 ENCOUNTER — Inpatient Hospital Stay: Payer: BC Managed Care – PPO

## 2022-01-07 VITALS — BP 108/78 | HR 72 | Temp 98.2°F | Resp 18

## 2022-01-07 DIAGNOSIS — D508 Other iron deficiency anemias: Secondary | ICD-10-CM

## 2022-01-07 DIAGNOSIS — D509 Iron deficiency anemia, unspecified: Secondary | ICD-10-CM | POA: Diagnosis not present

## 2022-01-07 DIAGNOSIS — E611 Iron deficiency: Secondary | ICD-10-CM | POA: Diagnosis not present

## 2022-01-07 MED ORDER — SODIUM CHLORIDE 0.9 % IV SOLN
200.0000 mg | Freq: Once | INTRAVENOUS | Status: AC
  Administered 2022-01-07: 200 mg via INTRAVENOUS
  Filled 2022-01-07: qty 200

## 2022-01-07 MED ORDER — SODIUM CHLORIDE 0.9 % IV SOLN: Freq: Once | INTRAVENOUS | Status: DC

## 2022-01-07 NOTE — Progress Notes (Signed)
Patient declined 30 minute post iron infusion wait. Stated he has had it several times. No issues VSS upon discharge.

## 2022-01-07 NOTE — Patient Instructions (Signed)

## 2022-01-14 ENCOUNTER — Inpatient Hospital Stay: Payer: BC Managed Care – PPO | Attending: Physician Assistant

## 2022-01-14 VITALS — BP 110/80 | HR 75 | Temp 97.7°F | Resp 16

## 2022-01-14 DIAGNOSIS — D509 Iron deficiency anemia, unspecified: Secondary | ICD-10-CM | POA: Insufficient documentation

## 2022-01-14 DIAGNOSIS — E119 Type 2 diabetes mellitus without complications: Secondary | ICD-10-CM | POA: Diagnosis not present

## 2022-01-14 DIAGNOSIS — Z7984 Long term (current) use of oral hypoglycemic drugs: Secondary | ICD-10-CM | POA: Diagnosis not present

## 2022-01-14 DIAGNOSIS — Z79899 Other long term (current) drug therapy: Secondary | ICD-10-CM | POA: Diagnosis not present

## 2022-01-14 DIAGNOSIS — I252 Old myocardial infarction: Secondary | ICD-10-CM | POA: Insufficient documentation

## 2022-01-14 DIAGNOSIS — E7800 Pure hypercholesterolemia, unspecified: Secondary | ICD-10-CM | POA: Diagnosis not present

## 2022-01-14 DIAGNOSIS — D508 Other iron deficiency anemias: Secondary | ICD-10-CM

## 2022-01-14 DIAGNOSIS — I2510 Atherosclerotic heart disease of native coronary artery without angina pectoris: Secondary | ICD-10-CM | POA: Insufficient documentation

## 2022-01-14 DIAGNOSIS — Z7982 Long term (current) use of aspirin: Secondary | ICD-10-CM | POA: Insufficient documentation

## 2022-01-14 MED ORDER — SODIUM CHLORIDE 0.9 % IV SOLN: Freq: Once | INTRAVENOUS | Status: AC

## 2022-01-14 MED ORDER — SODIUM CHLORIDE 0.9 % IV SOLN
200.0000 mg | Freq: Once | INTRAVENOUS | Status: AC
  Administered 2022-01-14: 200 mg via INTRAVENOUS
  Filled 2022-01-14: qty 200

## 2022-01-14 NOTE — Patient Instructions (Signed)

## 2022-01-21 ENCOUNTER — Inpatient Hospital Stay: Payer: BC Managed Care – PPO

## 2022-01-21 VITALS — BP 109/79 | HR 73 | Resp 14

## 2022-01-21 DIAGNOSIS — D508 Other iron deficiency anemias: Secondary | ICD-10-CM

## 2022-01-21 DIAGNOSIS — D509 Iron deficiency anemia, unspecified: Secondary | ICD-10-CM | POA: Diagnosis not present

## 2022-01-21 MED ORDER — SODIUM CHLORIDE 0.9 % IV SOLN: Freq: Once | INTRAVENOUS | Status: AC

## 2022-01-21 MED ORDER — SODIUM CHLORIDE 0.9 % IV SOLN
200.0000 mg | Freq: Once | INTRAVENOUS | Status: AC
  Administered 2022-01-21: 200 mg via INTRAVENOUS
  Filled 2022-01-21: qty 200

## 2022-01-21 NOTE — Patient Instructions (Signed)

## 2022-02-14 IMAGING — CT CT ANGIO CHEST-ABD-PELV FOR DISSECTION W/ AND WO/W CM
2 of 7 series · 12 of 46 positions shown, 14 images · IV contrast (OMNI 350)
Comparison: Radiograph earlier today.

CLINICAL DATA: Chest pain radiating to left arm with nausea. Aortic
dissection suspected.

EXAM:
CT ANGIOGRAPHY CHEST, ABDOMEN AND PELVIS
TECHNIQUE: Non-contrast CT of the chest was initially obtained. Multidetector
CT imaging through the chest, abdomen and pelvis was performed using
the standard protocol during bolus administration of intravenous
contrast. Multiplanar reconstructed images and MIPs were obtained
and reviewed to evaluate the vascular anatomy.
CONTRAST:  100mL OMNIPAQUE IOHEXOL 350 MG/ML SOLN

[Series 7: dissection 2mm · axial · 0.79mm/px · z∈[+860,+1430]mm · 9 of 339 slices shown, 11 images]
[im 36/339  soft-tissue]
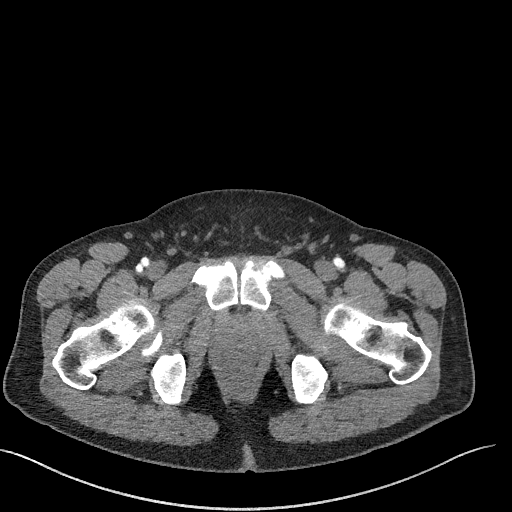
[im 36/339  bone]
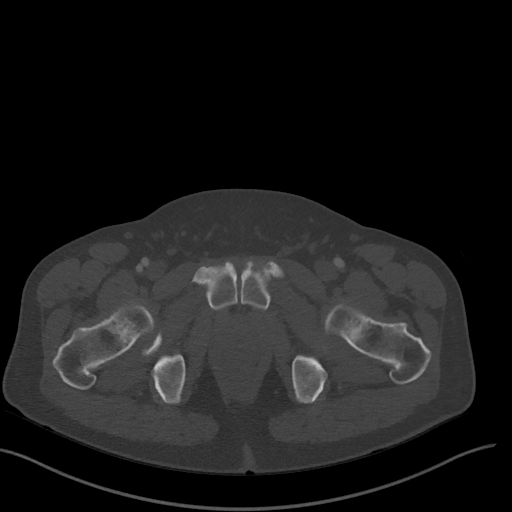
[im 72/339  soft-tissue]
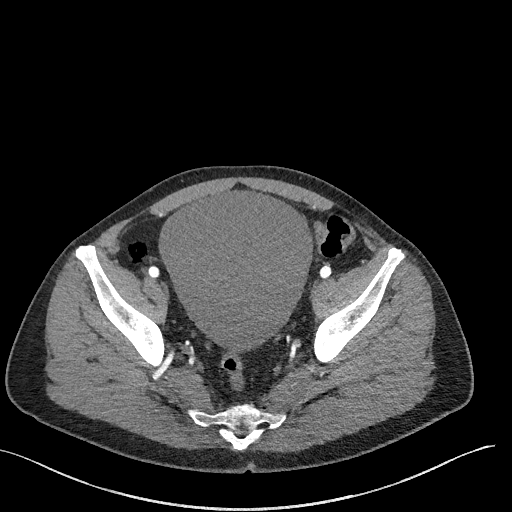
[im 107/339  soft-tissue]
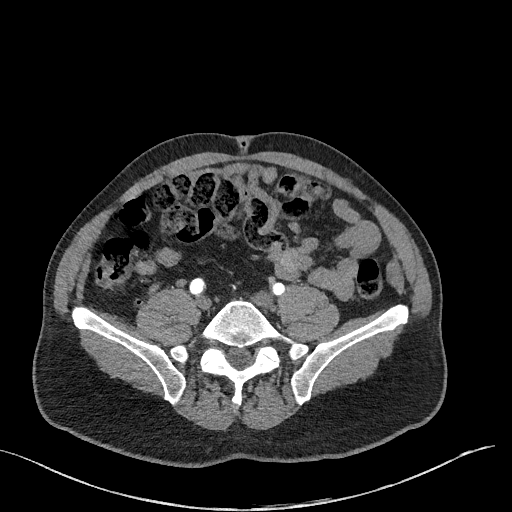
[im 143/339  soft-tissue]
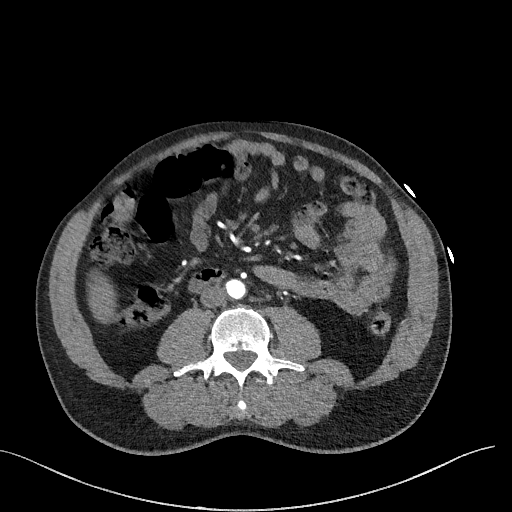
[im 178/339  soft-tissue]
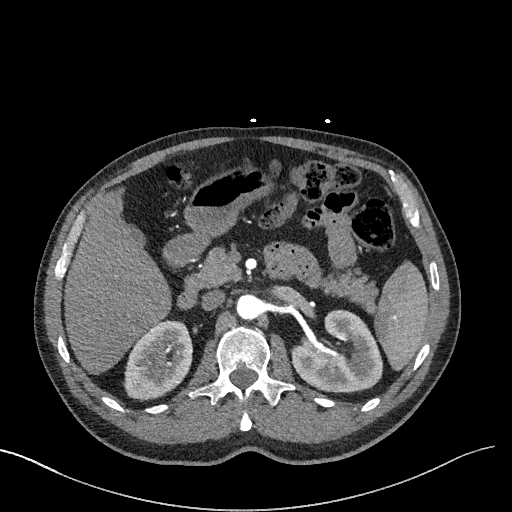
[im 214/339  soft-tissue]
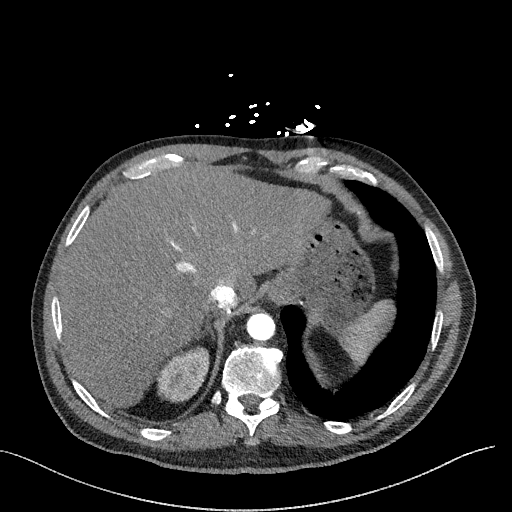
[im 250/339  soft-tissue]
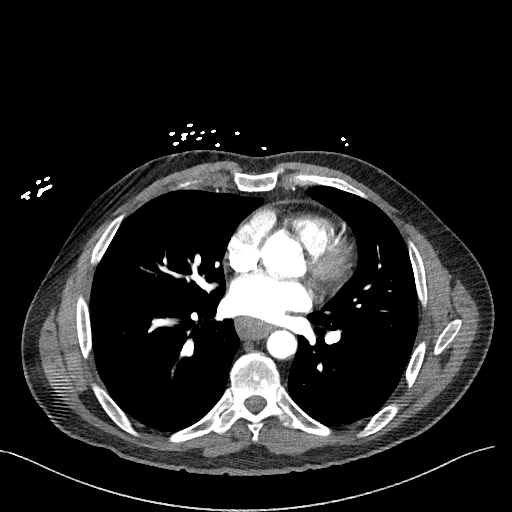
[im 285/339  soft-tissue]
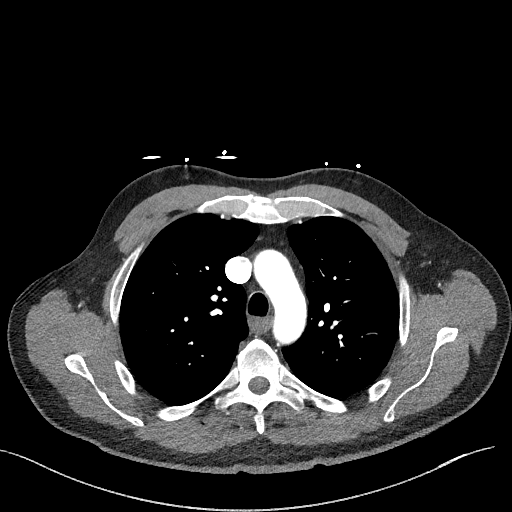
[im 321/339  soft-tissue]
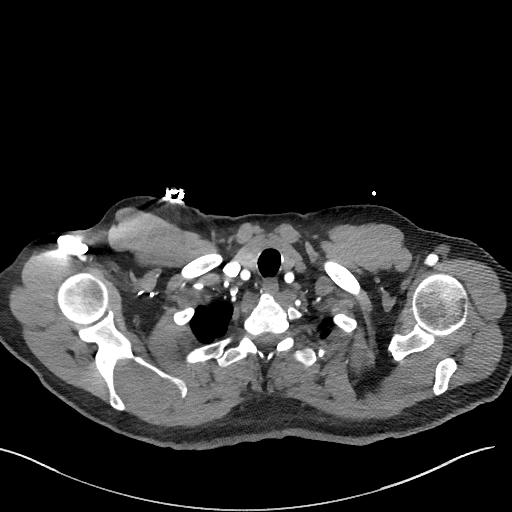
[im 321/339  bone]
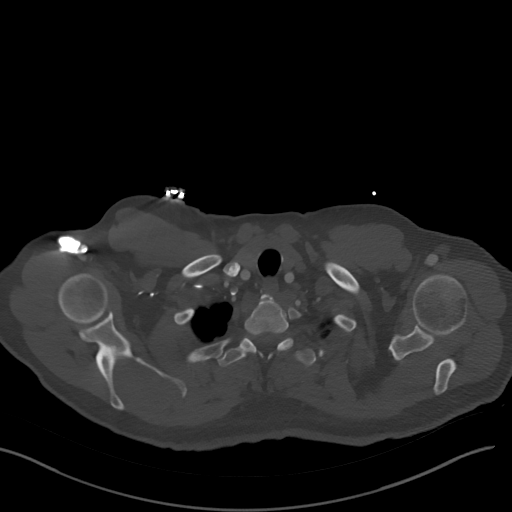

[Series 10: dissection 2mm cor · coronal · 0.72mm/px · 3 of 144 slices shown]
[im 36/144  soft-tissue]
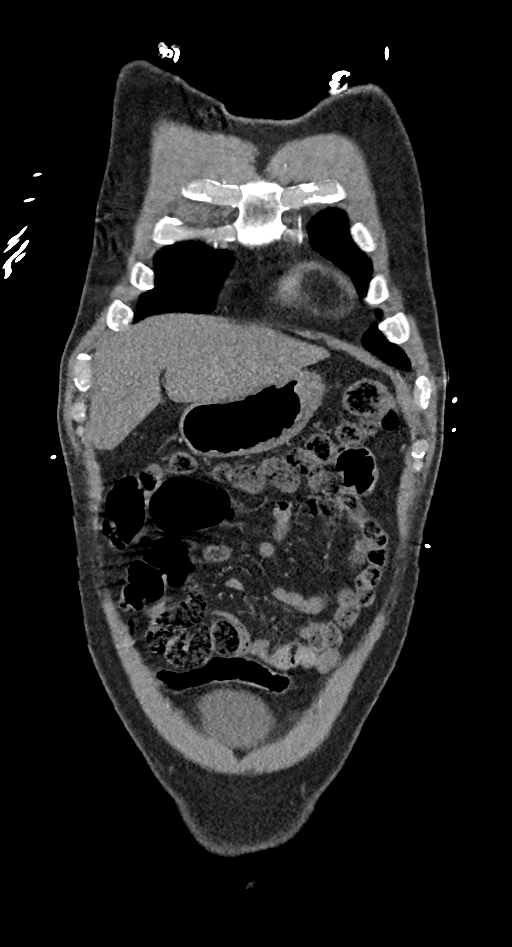
[im 72/144  soft-tissue]
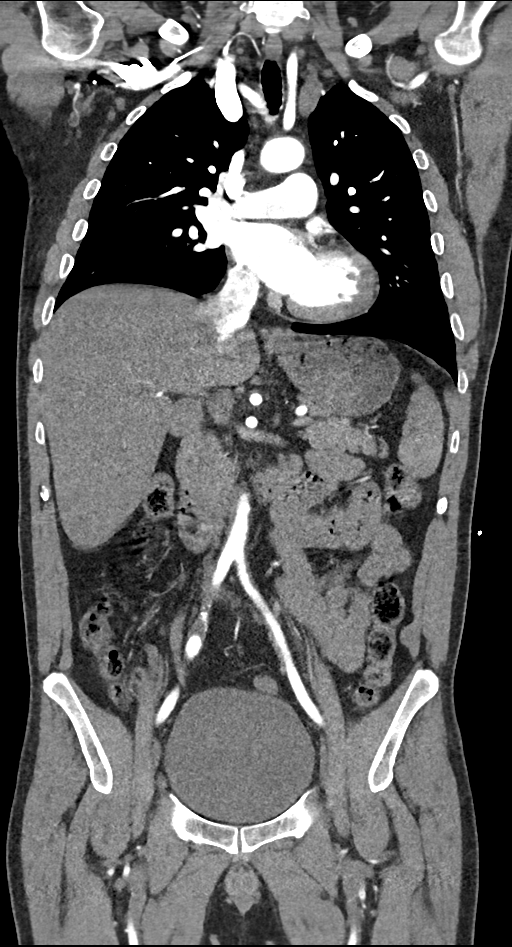
[im 108/144  soft-tissue]
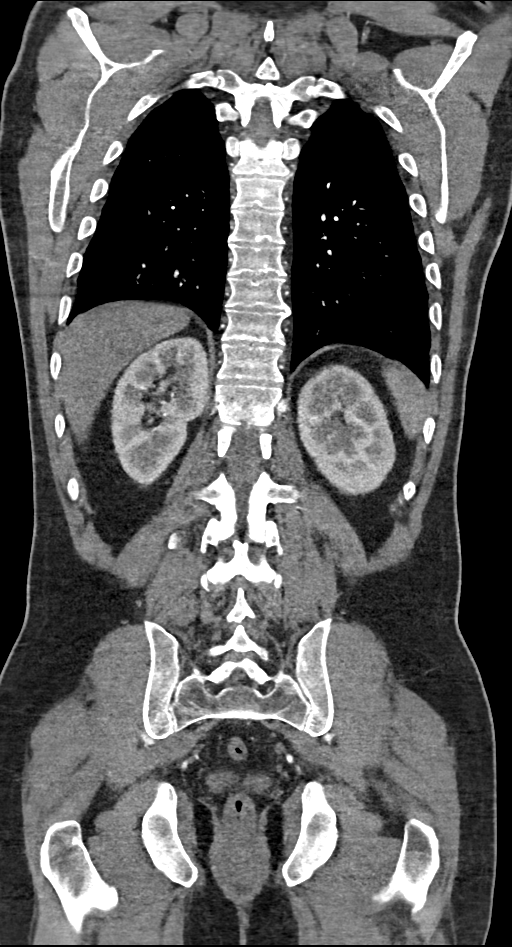

[12 of 46 positions shown; findings below may reference images not displayed]

FINDINGS: CTA CHEST FINDINGS

Cardiovascular: Thoracic aorta is normal in caliber. No dissection,
aortic hematoma, or evidence of acute aortic syndrome. No
significant atherosclerosis. Conventional branching pattern from the
aortic arch. Heart is normal in size without pericardial effusion.
No filling defects in the central pulmonary arteries to the
segmental level.

Mediastinum/Nodes: Diffuse esophageal wall thickening extending from
the carinal level to the gastroesophageal junction. Small
mediastinal lymph nodes not enlarged by size criteria. No hilar
adenopathy. No suspicious thyroid nodule.

Lungs/Pleura: Clear lungs. No consolidation. No pleural fluid. No
pulmonary edema. Trachea and mainstem bronchi are patent.

Musculoskeletal: Mild degenerative change in the spine with
Schmorl's nodes. There are no acute or suspicious osseous
abnormalities.

Review of the MIP images confirms the above findings.

CTA ABDOMEN AND PELVIS FINDINGS

VASCULAR

Aorta: Normal caliber aorta without aneurysm, dissection, vasculitis
or significant stenosis.

Celiac: Patent without evidence of aneurysm, dissection, vasculitis
or significant stenosis.

SMA: Patent without evidence of aneurysm, dissection, vasculitis or
significant stenosis.

Renals: Both renal arteries are patent without evidence of aneurysm,
dissection, vasculitis, fibromuscular dysplasia or significant
stenosis.

IMA: Patent without evidence of aneurysm, dissection, vasculitis or
significant stenosis.

Inflow: Predominantly noncalcified plaque involving the common iliac
arteries, right greater than left. There is approximately 50%
luminal narrowing of the right common iliac artery. No dissection or
acute findings.

Veins: No obvious venous abnormality within the limitations of this
arterial phase study.

Review of the MIP images confirms the above findings.

NON-VASCULAR

Hepatobiliary: No focal hepatic lesion on arterial phase exam.
Possible noncalcified gallstones. No pericholecystic inflammation or
biliary dilatation.

Pancreas: No ductal dilatation or inflammation.

Spleen: Normal in size without focal abnormality.

Adrenals/Urinary Tract: Normal adrenal glands. No hydronephrosis or
perinephric edema. No visualized renal calculi. Distended urinary
bladder without wall thickening.

Stomach/Bowel: Ingested material within the stomach. No evidence of
gastric wall thickening. No small bowel obstruction or inflammatory
change. There is fecalization of small bowel contents. Normal
appendix. Moderate volume of stool throughout the colon. The sigmoid
colon is redundant. No colonic wall thickening.

Lymphatic: No adenopathy.

Reproductive: Prominent prostate gland spans 5 cm transverse.

Other: Fat within both inguinal canals. No free air, free fluid, or
intra-abdominal fluid collection.

Musculoskeletal: There are no acute or suspicious osseous
abnormalities.

Review of the MIP images confirms the above findings.
IMPRESSION: 1. No aortic dissection or acute aortic abnormality.
2. Diffuse esophageal wall thickening extending from the mid
esophagus to the gastroesophageal junction. Findings may represent
reflux or esophagitis. Length of involvement favors against
neoplastic process.
3. Constipation bowel gas pattern with fecalization of small bowel
contents, suggesting slow transit.
4. Distended urinary bladder without wall thickening.
5. Possible noncalcified gallstones. No gallbladder inflammation or
biliary dilatation.

## 2022-02-14 IMAGING — DX DG CHEST 1V PORT
1 series · 1 of 1 positions shown · non-contrast
Comparison: Radiograph 01/03/2003

CLINICAL DATA: Chest pain

EXAM:
PORTABLE CHEST 1 VIEW

[chest ap]
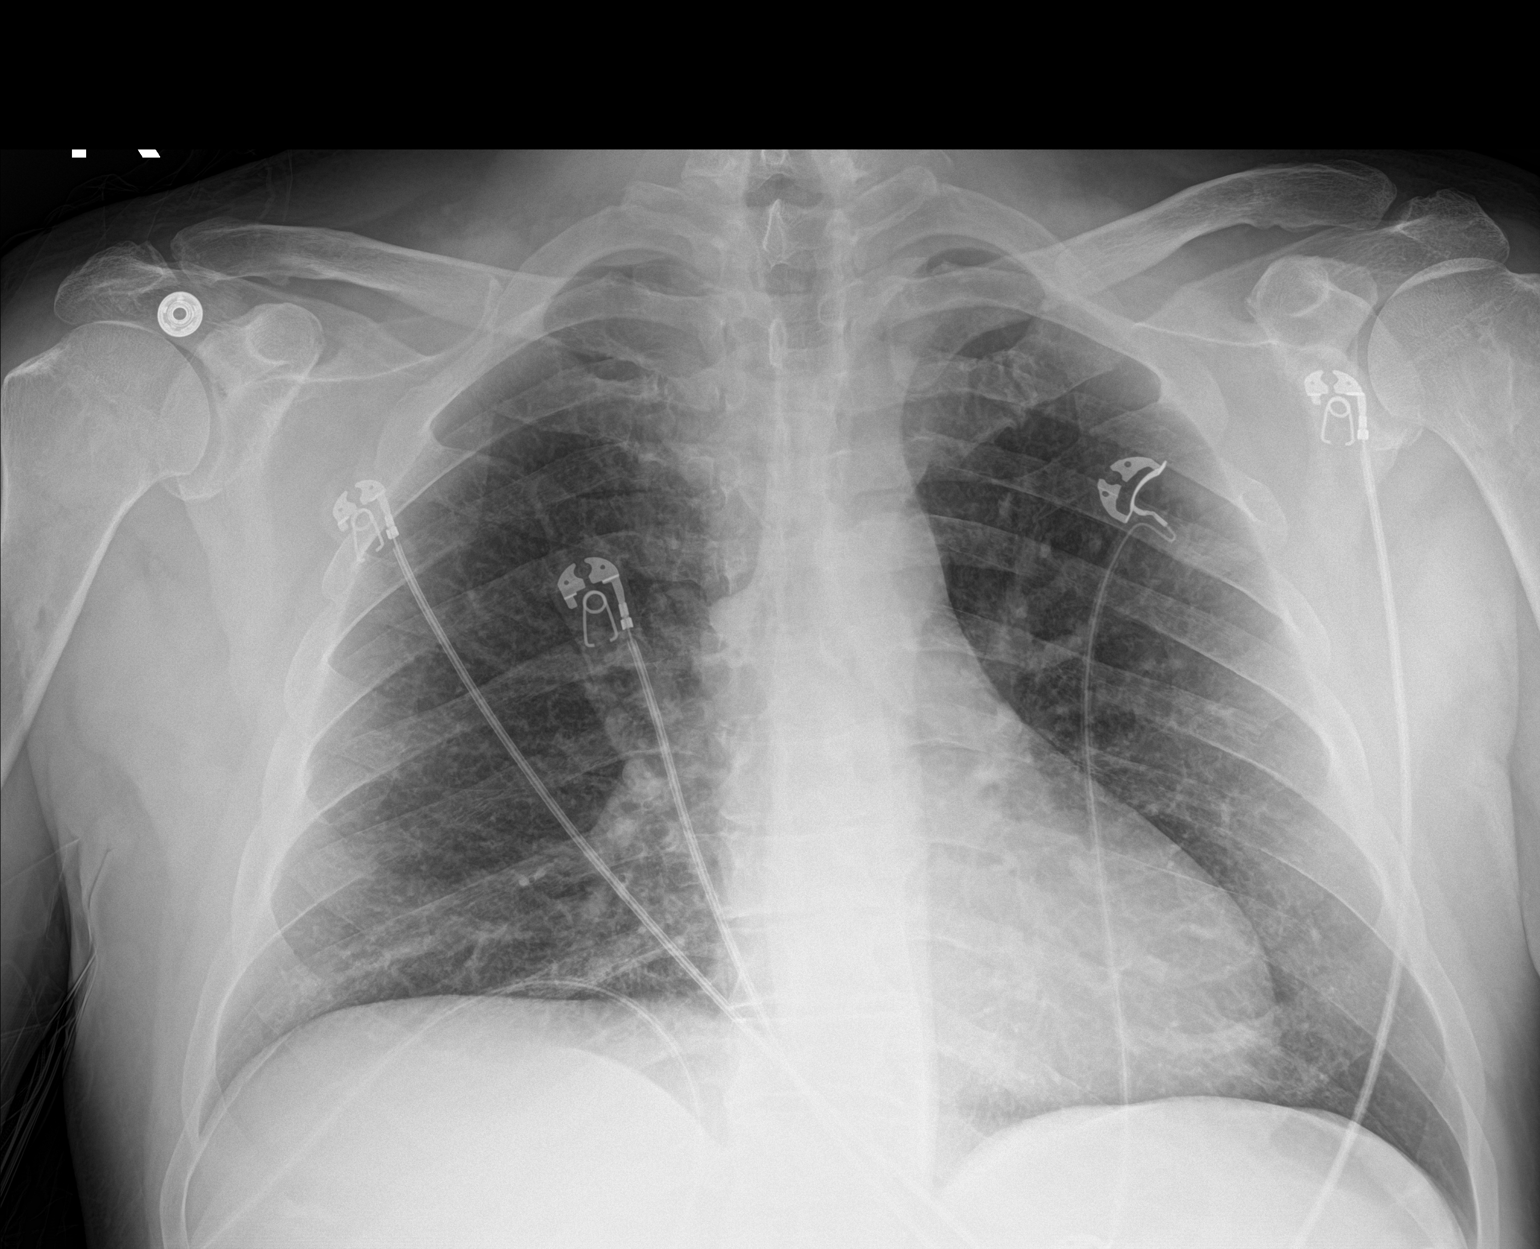

[1 of 1 positions shown; findings below may reference images not displayed]

FINDINGS: Streaky opacities in the bases likely reflect atelectasis with low
volumes and central vascular crowding. Some more coarse reticular
opacities are more indeterminate but could reflect chronic
interstitial change versus less likely acute interstitial
inflammation. No pneumothorax or effusion. The cardiomediastinal
contours are unremarkable. No acute osseous or soft tissue
abnormality. Degenerative changes are present in the imaged spine
and shoulders. Telemetry leads overlie the chest.
IMPRESSION: 1. Streaky opacities in the bases likely reflect atelectasis with
low volumes and central vascular crowding.
2. Coarse reticular opacities are indeterminate favor chronic
interstitial change.

## 2022-03-23 ENCOUNTER — Encounter: Payer: Self-pay | Admitting: Physician Assistant

## 2022-03-30 ENCOUNTER — Other Ambulatory Visit: Payer: BC Managed Care – PPO

## 2022-03-30 ENCOUNTER — Ambulatory Visit: Payer: BC Managed Care – PPO | Admitting: Physician Assistant

## 2022-06-27 ENCOUNTER — Other Ambulatory Visit: Payer: Self-pay | Admitting: Physician Assistant

## 2022-06-27 DIAGNOSIS — D508 Other iron deficiency anemias: Secondary | ICD-10-CM

## 2022-06-28 ENCOUNTER — Other Ambulatory Visit: Payer: Self-pay

## 2022-06-28 ENCOUNTER — Encounter: Payer: Self-pay | Admitting: Physician Assistant

## 2022-06-28 ENCOUNTER — Inpatient Hospital Stay (HOSPITAL_BASED_OUTPATIENT_CLINIC_OR_DEPARTMENT_OTHER): Payer: 59 | Admitting: Physician Assistant

## 2022-06-28 ENCOUNTER — Inpatient Hospital Stay: Payer: 59 | Attending: Physician Assistant

## 2022-06-28 ENCOUNTER — Telehealth: Payer: Self-pay

## 2022-06-28 VITALS — BP 110/81 | HR 66 | Temp 97.9°F | Resp 17 | Wt 178.1 lb

## 2022-06-28 DIAGNOSIS — D509 Iron deficiency anemia, unspecified: Secondary | ICD-10-CM | POA: Diagnosis present

## 2022-06-28 DIAGNOSIS — D508 Other iron deficiency anemias: Secondary | ICD-10-CM

## 2022-06-28 LAB — CMP (CANCER CENTER ONLY)
ALT: 17 U/L (ref 0–44)
AST: 17 U/L (ref 15–41)
Albumin: 4.4 g/dL (ref 3.5–5.0)
Alkaline Phosphatase: 103 U/L (ref 38–126)
Anion gap: 8 (ref 5–15)
BUN: 17 mg/dL (ref 6–20)
CO2: 27 mmol/L (ref 22–32)
Calcium: 8.7 mg/dL — ABNORMAL LOW (ref 8.9–10.3)
Chloride: 103 mmol/L (ref 98–111)
Creatinine: 0.74 mg/dL (ref 0.61–1.24)
GFR, Estimated: >60 mL/min (ref >60)
Glucose, Bld: 135 mg/dL — ABNORMAL HIGH (ref 70–99)
Potassium: 4.1 mmol/L (ref 3.5–5.1)
Sodium: 138 mmol/L (ref 135–145)
Total Bilirubin: 0.8 mg/dL (ref 0.3–1.2)
Total Protein: 7.3 g/dL (ref 6.5–8.1)

## 2022-06-28 LAB — CBC WITH DIFFERENTIAL (CANCER CENTER ONLY)
Abs Immature Granulocytes: 0.01 10*3/uL (ref 0.00–0.07)
Basophils Absolute: 0.1 10*3/uL (ref 0.0–0.1)
Basophils Relative: 1 %
Eosinophils Absolute: 0.2 10*3/uL (ref 0.0–0.5)
Eosinophils Relative: 3 %
HCT: 48 % (ref 39.0–52.0)
Hemoglobin: 15.6 g/dL (ref 13.0–17.0)
Immature Granulocytes: 0 %
Lymphocytes Relative: 23 %
Lymphs Abs: 1.7 10*3/uL (ref 0.7–4.0)
MCH: 27.8 pg (ref 26.0–34.0)
MCHC: 32.5 g/dL (ref 30.0–36.0)
MCV: 85.4 fL (ref 80.0–100.0)
Monocytes Absolute: 0.7 10*3/uL (ref 0.1–1.0)
Monocytes Relative: 9 %
Neutro Abs: 4.6 10*3/uL (ref 1.7–7.7)
Neutrophils Relative %: 64 %
Platelet Count: 256 10*3/uL (ref 150–400)
RBC: 5.62 MIL/uL (ref 4.22–5.81)
RDW: 14 % (ref 11.5–15.5)
WBC Count: 7.2 10*3/uL (ref 4.0–10.5)
nRBC: 0 % (ref 0.0–0.2)

## 2022-06-28 LAB — FERRITIN: Ferritin: 23 ng/mL — ABNORMAL LOW (ref 24–336)

## 2022-06-28 LAB — IRON AND IRON BINDING CAPACITY (CC-WL,HP ONLY)
Iron: 46 ug/dL (ref 45–182)
Saturation Ratios: 11 % — ABNORMAL LOW (ref 17.9–39.5)
TIBC: 433 ug/dL (ref 250–450)
UIBC: 387 ug/dL — ABNORMAL HIGH (ref 117–376)

## 2022-06-28 NOTE — Progress Notes (Signed)
[facility] [phone]   [phone]  PROGRESS NOTE  Patient Care Team: Tally Joe, MD as PCP - General (Family Medicine) Wendall Stade, MD as PCP - Cardiology (Cardiology) Charlott Rakes, MD as Consulting Physician (Gastroenterology) Karie Soda, MD as Consulting Physician (General Surgery) Wendall Stade, MD as Consulting Physician (Cardiology)  Hematological/Oncological History 1) Labs from [facility] PA-C, [facility] -04/17/2020: WBC 11.2 (H), Hgb 9.8 (L), MCV 59.0, Plt 322. Started on iron supplementation.  -06/02/2020: WBC 7.2, Hgb 11.2 (L), MCV 61.5 (L), Plt 247,  -06/18/2020: WBC 9.8, Hgb 11.0 (L), MCV 62.0 (L), Plt 265, Ferritin 4.8 (L), TIBC 524 (H), Iron 17 (L), Iron saturation 3% (L), Transferrin 374 (H) -08/04/2020: WBC 7.4, Hgb 12.5 (L), MCV 66.4 (L), Plt 238, Ferritin 12.1 (L), TIBC 484 (H), Iron 20 (L), Iron saturation 4% (L), Transferrin 346 (H)  2) 08/26/2020: Establish care with Georga Kaufmann PA-C  3) 09/07/2020-8/08/222: Received IV venofer x 3 doses.   4) 01/07/2022-01/21/2022: Received IV venofer x 3 doses  CHIEF COMPLAINTS/PURPOSE OF CONSULTATION:  "Iron deficiency anemia "  HISTORY OF PRESENTING ILLNESS:  Shawn Landry 52 y.o. male returns today for follow-up for iron deficiency anemia. He was last seen on 12/24/2021. In the interim, he received IV venofer 200 mg x 3 doses. Patient is unaccompanied for this visit.    On exam today, Shawn Landry reports no changes to his energy or appetite. He is able to complete his ADLs independently.He denies any nausea, vomiting or abdominal pain. He denies any overt signs of bleeding including hematochezia or melena. He denies any diet changes or new medications.  He denies any fevers, chills, night sweats, shortness of breath, chest pain or cough.  He has no other complaints.  Rest of the 10 point ROS is below.   MEDICAL HISTORY:  Past Medical History:  Diagnosis Date   Anemia     Anxiety    Coronary artery disease    Diabetes mellitus without complication (HCC)    type 2   GERD (gastroesophageal reflux disease)    Heat intolerance    High cholesterol    History of heart attack    Hyperlipidemia    Neuropathy     SURGICAL HISTORY: Past Surgical History:  Procedure Laterality Date   CORONARY STENT INTERVENTION  07/09/2019   CORONARY STENT INTERVENTION N/A 07/09/2019   Procedure: CORONARY STENT INTERVENTION;  Surgeon: Lyn Records, MD;  [facility];  Service: Cardiovascular;  Laterality: N/A;   LEFT HEART CATH AND CORONARY ANGIOGRAPHY N/A 07/09/2019   Procedure: LEFT HEART CATH AND CORONARY ANGIOGRAPHY;  Surgeon: Lyn Records, MD;  Location: [facility];  Service: Cardiovascular;  Laterality: N/A;   ROTATOR CUFF REPAIR Right    ROTATOR CUFF REPAIR Right 2010    SOCIAL HISTORY: Social History   Socioeconomic History   Marital status: Single    Spouse name: Not on file   Number of children: 0   Years of education: Not on file   Highest education level: Bachelor's degree (e.g., BA, AB, BS)  Occupational History    Comment: Sport and exercise psychologist  Tobacco Use   Smoking status: Former    Types: Cigarettes    Quit date: 2010    Years since quitting: 14.3   Smokeless tobacco: Never  Vaping Use   Vaping Use: Never used  Substance and Sexual Activity   Alcohol use: Yes    Comment: rare   Drug  use: Never   Sexual activity: Not on file  Other Topics Concern   Not on file  Social History Narrative   Lives alone   Caffeine 1-2 c daily   Social Determinants of Health   Financial Resource Strain: Not on file  Food Insecurity: Not on file  Transportation Needs: Not on file  Physical Activity: Not on file  Stress: Not on file  Social Connections: Not on file  Intimate Partner Violence: Not on file    FAMILY HISTORY: Family History  Problem Relation Age of Onset   Sudden Cardiac Death Brother 94   Breast cancer Mother      ALLERGIES:  has No Known Allergies.  MEDICATIONS:  Current Outpatient Medications  Medication Sig Dispense Refill   aspirin EC 81 MG tablet Take 81 mg by mouth daily.     atorvastatin (LIPITOR) 80 MG tablet Take 1 tablet (80 mg total) by mouth daily. 90 tablet 3   blood glucose meter kit and supplies Dispense based on patient and insurance preference. Use up to four times daily as directed. (FOR ICD-10 E10.9, E11.9). 1 each 0   Continuous Blood Gluc Sensor (FREESTYLE LIBRE 2 SENSOR) MISC Use every 14 days 2 each 1   empagliflozin (JARDIANCE) 10 MG TABS tablet Take 1 tablet (10 mg total) by mouth daily before breakfast. 30 tablet 3   FERROUS SULFATE PO Take 325 mg by mouth daily.     levothyroxine (SYNTHROID) 88 MCG tablet Take 88 mcg by mouth daily before breakfast.     Metoprolol Tartrate 75 MG TABS Take 1 tablet by mouth 2 (two) times daily.     nitroGLYCERIN (NITROSTAT) 0.4 MG SL tablet Place 1 tablet (0.4 mg total) under the tongue every 5 (five) minutes as needed for chest pain. 25 tablet 11   pantoprazole (PROTONIX) 40 MG tablet Take 40 mg by mouth daily.     metFORMIN (GLUCOPHAGE) 500 MG tablet Take 1 tablet (500 mg total) by mouth 2 (two) times daily with a meal. 60 tablet 1   No current facility-administered medications for this visit.    REVIEW OF SYSTEMS:   Constitutional: ( - ) fevers, ( - )  chills , ( - ) night sweats Eyes: ( - ) blurriness of vision, ( - ) double vision, ( - ) watery eyes Ears, nose, mouth, throat, and face: ( - ) mucositis, ( - ) sore throat Respiratory: ( - ) cough, ( - ) dyspnea, ( - ) wheezes Cardiovascular: ( - ) palpitation, ( - ) chest discomfort, ( - ) lower extremity swelling Gastrointestinal:  ( - ) nausea, ( - ) heartburn, ( + ) change in bowel habits Skin: ( - ) abnormal skin rashes Lymphatics: ( - ) new lymphadenopathy, ( - ) easy bruising Neurological: ( - ) numbness, ( - ) tingling, ( - ) new weaknesses Behavioral/Psych: ( - ) mood  change, ( - ) new changes  All other systems were reviewed with the patient and are negative.  PHYSICAL EXAMINATION: ECOG PERFORMANCE STATUS: 0 - Asymptomatic  Vitals:   06/28/22 1050  BP: 110/81  Pulse: 66  Resp: 17  Temp: 97.9 F (36.6 C)  SpO2: 100%   Filed Weights   06/28/22 1050  Weight: 178 lb 1.6 oz (80.8 kg)    GENERAL: well appearing male in NAD  SKIN: skin color, texture, turgor are normal, no rashes or significant lesions EYES: conjunctiva are pink and non-injected, sclera clear OROPHARYNX: no exudate, no  erythema; lips, buccal mucosa, and tongue normal  LUNGS: clear to auscultation and percussion with normal breathing effort HEART: regular rate & rhythm and no murmurs and no lower extremity edema Musculoskeletal: no cyanosis of digits and no clubbing  PSYCH: alert & oriented x 3, fluent speech NEURO: no focal motor/sensory deficits  LABORATORY DATA:  I have reviewed the data as listed    Latest Ref Rng & Units 06/28/2022    9:52 AM 12/24/2021    7:44 AM 06/23/2021    9:28 AM  CBC  WBC 4.0 - 10.5 K/uL 7.2  9.4  7.5   Hemoglobin 13.0 - 17.0 g/dL 95.6  21.3  08.6   Hematocrit 39.0 - 52.0 % 48.0  49.4  49.6   Platelets 150 - 400 K/uL 256  235  197        Latest Ref Rng & Units 06/28/2022    9:52 AM 08/26/2020    9:46 AM 08/30/2019    9:07 AM  CMP  Glucose 70 - 99 mg/dL 578  469    BUN 6 - 20 mg/dL 17  12    Creatinine 6.29 - 1.24 mg/dL 5.28  4.13    Sodium 244 - 145 mmol/L 138  141    Potassium 3.5 - 5.1 mmol/L 4.1  4.2    Chloride 98 - 111 mmol/L 103  105    CO2 22 - 32 mmol/L 27  27    Calcium 8.9 - 10.3 mg/dL 8.7  8.9    Total Protein 6.5 - 8.1 g/dL 7.3  7.4  6.8   Total Bilirubin 0.3 - 1.2 mg/dL 0.8  0.5  0.4   Alkaline Phos 38 - 126 U/L 103  102  139   AST 15 - 41 U/L 17  17  19    ALT 0 - 44 U/L 17  15  22     ASSESSMENT & PLAN Shawn Landry is a 52 y.o. male who returns for a follow up for iron deficiency anemia  #Iron deficiency anemia,  suspect secondary to GI bleed with hemorrhoids --Patient is under the care of [facility]. EGD and colonoscopy were done on 08/18/2020. Findings included internal hemorrhoids, non-thrombosed external hemorrhoids.  --Patient was evaluated by general surgery (Dr.Steven Gross from [facility]) on 01/11/2021 to discuss hemorrhoidectomy. Patient has elected to hold off on surgical intervention.  --Last received IV venofer x 3 doses from 01/07/2022-01/21/2022.  --Labs today show no evidence of anemia with a hemoglobin of 15.6, MCV 85.4. Iron panel shows improvement with serum iron 46, saturation 11%, ferritin 23. --Recommend IV venofer 200 mg x 2 doses to bolster iron levels.  --Recommend to continue to take ferrous sulfate 325 mg once daily with a source of vitamin C.  --RTC 3 months for lab only check and 6 months for a follow up visit.    Orders Placed This Encounter  Procedures   CBC with Differential (Cancer Center Only)    Standing Status:   Standing    Number of Occurrences:   2    Standing Expiration Date:   06/28/2023   Ferritin    Standing Status:   Standing    Number of Occurrences:   2    Standing Expiration Date:   06/28/2023   Iron and Iron Binding Capacity ([facility] only)    Standing Status:   Standing    Number of Occurrences:   2    Standing Expiration Date:   06/28/2023    All questions were  answered. The patient knows to call the clinic with any problems, questions or concerns.  I have spent a total of 25 minutes minutes of face-to-face and non-face-to-face time, preparing to see the patient, performing a medically appropriate examination, counseling and educating the patient, documenting clinical information in the electronic health record, and care coordination.    Georga Kaufmann, PA-C Department of Hematology/Oncology [facility] at [facility] Phone: [phone]

## 2022-06-28 NOTE — Telephone Encounter (Signed)
Shawn Landry: Can you notify patient that his iron levels have improved but still below normal. I would recommend 2 doses of IV iron to bolster levels   IT Lanora Manis: Once [name] speaks to patient, can you schedule those IV venofer 200 mg once weekly x 2 doses   pt advised but wants to make sure it is approved by his insurance before scheduling

## 2022-06-29 ENCOUNTER — Encounter: Payer: Self-pay | Admitting: Cardiovascular Disease

## 2022-06-29 MED ORDER — METOPROLOL TARTRATE 75 MG PO TABS: 1.0000 | ORAL_TABLET | Freq: Two times a day (BID) | ORAL | 1 refills | Status: DC

## 2022-06-30 ENCOUNTER — Encounter: Payer: Self-pay | Admitting: Physician Assistant

## 2022-07-06 ENCOUNTER — Encounter: Payer: Self-pay | Admitting: Physician Assistant

## 2022-09-28 ENCOUNTER — Inpatient Hospital Stay: Payer: 59 | Attending: Physician Assistant

## 2022-09-28 ENCOUNTER — Other Ambulatory Visit: Payer: Self-pay

## 2022-09-28 DIAGNOSIS — D509 Iron deficiency anemia, unspecified: Secondary | ICD-10-CM | POA: Insufficient documentation

## 2022-09-28 DIAGNOSIS — D508 Other iron deficiency anemias: Secondary | ICD-10-CM

## 2022-09-28 LAB — IRON AND IRON BINDING CAPACITY (CC-WL,HP ONLY)
Iron: 34 ug/dL — ABNORMAL LOW (ref 45–182)
Saturation Ratios: 8 % — ABNORMAL LOW (ref 17.9–39.5)
TIBC: 428 ug/dL (ref 250–450)
UIBC: 394 ug/dL — ABNORMAL HIGH (ref 117–376)

## 2022-09-28 LAB — CMP (CANCER CENTER ONLY)
ALT: 20 U/L (ref 0–44)
AST: 21 U/L (ref 15–41)
Albumin: 4.4 g/dL (ref 3.5–5.0)
Alkaline Phosphatase: 118 U/L (ref 38–126)
Anion gap: 9 (ref 5–15)
BUN: 10 mg/dL (ref 6–20)
CO2: 31 mmol/L (ref 22–32)
Calcium: 8.8 mg/dL — ABNORMAL LOW (ref 8.9–10.3)
Chloride: 103 mmol/L (ref 98–111)
Creatinine: 0.73 mg/dL (ref 0.61–1.24)
GFR, Estimated: >60 mL/min (ref >60)
Glucose, Bld: 141 mg/dL — ABNORMAL HIGH (ref 70–99)
Potassium: 3.9 mmol/L (ref 3.5–5.1)
Sodium: 143 mmol/L (ref 135–145)
Total Bilirubin: 0.7 mg/dL (ref 0.3–1.2)
Total Protein: 7.4 g/dL (ref 6.5–8.1)

## 2022-09-28 LAB — CBC WITH DIFFERENTIAL (CANCER CENTER ONLY)
Abs Immature Granulocytes: 0.02 10*3/uL (ref 0.00–0.07)
Basophils Absolute: 0.1 10*3/uL (ref 0.0–0.1)
Basophils Relative: 1 %
Eosinophils Absolute: 0.2 10*3/uL (ref 0.0–0.5)
Eosinophils Relative: 2 %
HCT: 44.7 % (ref 39.0–52.0)
Hemoglobin: 14.4 g/dL (ref 13.0–17.0)
Immature Granulocytes: 0 %
Lymphocytes Relative: 22 %
Lymphs Abs: 1.8 10*3/uL (ref 0.7–4.0)
MCH: 28 pg (ref 26.0–34.0)
MCHC: 32.2 g/dL (ref 30.0–36.0)
MCV: 86.8 fL (ref 80.0–100.0)
Monocytes Absolute: 0.8 10*3/uL (ref 0.1–1.0)
Monocytes Relative: 9 %
Neutro Abs: 5.4 10*3/uL (ref 1.7–7.7)
Neutrophils Relative %: 66 %
Platelet Count: 226 10*3/uL (ref 150–400)
RBC: 5.15 MIL/uL (ref 4.22–5.81)
RDW: 14.9 % (ref 11.5–15.5)
WBC Count: 8.2 10*3/uL (ref 4.0–10.5)
nRBC: 0 % (ref 0.0–0.2)

## 2022-09-28 LAB — FERRITIN: Ferritin: 21 ng/mL — ABNORMAL LOW (ref 24–336)

## 2022-10-03 ENCOUNTER — Other Ambulatory Visit: Payer: Self-pay | Admitting: Physician Assistant

## 2022-10-04 ENCOUNTER — Telehealth: Payer: Self-pay

## 2022-10-04 NOTE — Telephone Encounter (Signed)
T/C to pt with lab results and the need for IV iron. Explained to him that there is no guarantee of payment, even with meds/procedures that require and receive an [place].   Pt has decided to continue with oral iron at this time

## 2022-10-04 NOTE — Telephone Encounter (Signed)
-----   Message from Briant Cedar sent at 10/04/2022  2:25 PM EDT ----- Please check with patient if he is willing to proceed with IV iron since iron levels are low. ----- Message ----- From: Interface, Lab In [facility] Sent: 09/28/2022   9:51 AM EDT To: Briant Cedar, PA-C

## 2022-11-14 ENCOUNTER — Telehealth: Payer: Self-pay | Admitting: Physician Assistant

## 2022-11-16 ENCOUNTER — Other Ambulatory Visit: Payer: Self-pay | Admitting: Cardiovascular Disease

## 2022-12-07 ENCOUNTER — Inpatient Hospital Stay: Payer: 59 | Admitting: Physician Assistant

## 2022-12-07 ENCOUNTER — Inpatient Hospital Stay: Payer: 59 | Attending: Physician Assistant

## 2022-12-07 ENCOUNTER — Other Ambulatory Visit: Payer: Self-pay

## 2022-12-07 ENCOUNTER — Other Ambulatory Visit: Payer: Self-pay | Admitting: Physician Assistant

## 2022-12-07 VITALS — BP 105/73 | HR 72 | Temp 98.1°F | Resp 18 | Ht 65.5 in | Wt 178.9 lb

## 2022-12-07 DIAGNOSIS — Z87891 Personal history of nicotine dependence: Secondary | ICD-10-CM | POA: Diagnosis not present

## 2022-12-07 DIAGNOSIS — K644 Residual hemorrhoidal skin tags: Secondary | ICD-10-CM | POA: Diagnosis not present

## 2022-12-07 DIAGNOSIS — D508 Other iron deficiency anemias: Secondary | ICD-10-CM

## 2022-12-07 DIAGNOSIS — D509 Iron deficiency anemia, unspecified: Secondary | ICD-10-CM | POA: Insufficient documentation

## 2022-12-07 DIAGNOSIS — K648 Other hemorrhoids: Secondary | ICD-10-CM | POA: Diagnosis not present

## 2022-12-07 LAB — IRON AND IRON BINDING CAPACITY (CC-WL,HP ONLY)
Iron: 90 ug/dL (ref 45–182)
Saturation Ratios: 22 % (ref 17.9–39.5)
TIBC: 416 ug/dL (ref 250–450)
UIBC: 326 ug/dL (ref 117–376)

## 2022-12-07 LAB — CBC WITH DIFFERENTIAL (CANCER CENTER ONLY)
Abs Immature Granulocytes: 0.02 10*3/uL (ref 0.00–0.07)
Basophils Absolute: 0 10*3/uL (ref 0.0–0.1)
Basophils Relative: 1 %
Eosinophils Absolute: 0.2 10*3/uL (ref 0.0–0.5)
Eosinophils Relative: 3 %
HCT: 49.4 % (ref 39.0–52.0)
Hemoglobin: 16 g/dL (ref 13.0–17.0)
Immature Granulocytes: 0 %
Lymphocytes Relative: 26 %
Lymphs Abs: 1.7 10*3/uL (ref 0.7–4.0)
MCH: 27.5 pg (ref 26.0–34.0)
MCHC: 32.4 g/dL (ref 30.0–36.0)
MCV: 85 fL (ref 80.0–100.0)
Monocytes Absolute: 0.7 10*3/uL (ref 0.1–1.0)
Monocytes Relative: 10 %
Neutro Abs: 4.2 10*3/uL (ref 1.7–7.7)
Neutrophils Relative %: 60 %
Platelet Count: 179 10*3/uL (ref 150–400)
RBC: 5.81 MIL/uL (ref 4.22–5.81)
RDW: 13.5 % (ref 11.5–15.5)
WBC Count: 6.8 10*3/uL (ref 4.0–10.5)
nRBC: 0 % (ref 0.0–0.2)

## 2022-12-07 LAB — FERRITIN: Ferritin: 28 ng/mL (ref 24–336)

## 2022-12-07 NOTE — Progress Notes (Signed)
[facility] [phone]   [phone]  PROGRESS NOTE  Patient Care Team: Tally Joe, MD as PCP - General (Family Medicine) Wendall Stade, MD as PCP - Cardiology (Cardiology) Charlott Rakes, MD as Consulting Physician (Gastroenterology) Karie Soda, MD as Consulting Physician (General Surgery) Wendall Stade, MD as Consulting Physician (Cardiology)  Hematological/Oncological History 1) Labs from [facility] PA-C, [facility] -04/17/2020: WBC 11.2 (H), Hgb 9.8 (L), MCV 59.0, Plt 322. Started on iron supplementation.  -06/02/2020: WBC 7.2, Hgb 11.2 (L), MCV 61.5 (L), Plt 247,  -06/18/2020: WBC 9.8, Hgb 11.0 (L), MCV 62.0 (L), Plt 265, Ferritin 4.8 (L), TIBC 524 (H), Iron 17 (L), Iron saturation 3% (L), Transferrin 374 (H) -08/04/2020: WBC 7.4, Hgb 12.5 (L), MCV 66.4 (L), Plt 238, Ferritin 12.1 (L), TIBC 484 (H), Iron 20 (L), Iron saturation 4% (L), Transferrin 346 (H)  2) 08/26/2020: Establish care with Georga Kaufmann PA-C  3) 09/07/2020-8/08/222: Received IV venofer x 3 doses.   4) 01/07/2022-01/21/2022: Received IV venofer x 3 doses  CHIEF COMPLAINTS/PURPOSE OF CONSULTATION:  "Iron deficiency anemia "  HISTORY OF PRESENTING ILLNESS:  Shawn Landry 52 y.o. male returns today for follow-up for iron deficiency anemia. He was last seen on 06/28/2022. In the interim, he continues on PO iron therapy. Patient is unaccompanied for this visit.    On exam today, Shawn Landry reports he is feeling well without any changes to his health. His energy and appetite are stable. He denies any bruising or signs of bleeding including hematochezia or melena. He denies nausea, vomiting or bowel habit changes. He denies any fevers, chills, night sweats, shortness of breath, chest pain or cough.  He has no other complaints.  Rest of the 10 point ROS is below.   MEDICAL HISTORY:  Past Medical History:  Diagnosis Date   Anemia    Anxiety    Coronary artery disease     Diabetes mellitus without complication (HCC)    type 2   GERD (gastroesophageal reflux disease)    Heat intolerance    High cholesterol    History of heart attack    Hyperlipidemia    Neuropathy     SURGICAL HISTORY: Past Surgical History:  Procedure Laterality Date   CORONARY STENT INTERVENTION  07/09/2019   CORONARY STENT INTERVENTION N/A 07/09/2019   Procedure: CORONARY STENT INTERVENTION;  Surgeon: Lyn Records, MD;  [facility]: [facility];  Service: Cardiovascular;  Laterality: N/A;   LEFT HEART CATH AND CORONARY ANGIOGRAPHY N/A 07/09/2019   Procedure: LEFT HEART CATH AND CORONARY ANGIOGRAPHY;  Surgeon: Lyn Records, MD;  Location: [facility];  Service: Cardiovascular;  Laterality: N/A;   ROTATOR CUFF REPAIR Right    ROTATOR CUFF REPAIR Right 2010    SOCIAL HISTORY: Social History   Socioeconomic History   Marital status: Single    Spouse name: Not on file   Number of children: 0   Years of education: Not on file   Highest education level: Bachelor's degree (e.g., BA, AB, BS)  Occupational History    Comment: Sport and exercise psychologist  Tobacco Use   Smoking status: Former    Current packs/day: 0.00    Types: Cigarettes    Quit date: 2010    Years since quitting: 14.8   Smokeless tobacco: Never  Vaping Use   Vaping status: Never Used  Substance and Sexual Activity   Alcohol use: Yes    Comment: rare   Drug use: Never  Sexual activity: Not on file  Other Topics Concern   Not on file  Social History Narrative   Lives alone   Caffeine 1-2 c daily   Social Determinants of Health   Financial Resource Strain: Not on file  Food Insecurity: Not on file  Transportation Needs: Not on file  Physical Activity: Not on file  Stress: Not on file  Social Connections: Not on file  Intimate Partner Violence: Not on file    FAMILY HISTORY: Family History  Problem Relation Age of Onset   Sudden Cardiac Death Brother 69   Breast cancer Mother      ALLERGIES:  has No Known Allergies.  MEDICATIONS:  Current Outpatient Medications  Medication Sig Dispense Refill   aspirin EC 81 MG tablet Take 81 mg by mouth daily.     atorvastatin (LIPITOR) 80 MG tablet TAKE 1 TABLET BY MOUTH EVERY DAY 30 tablet 0   blood glucose meter kit and supplies Dispense based on patient and insurance preference. Use up to four times daily as directed. (FOR ICD-10 E10.9, E11.9). 1 each 0   Continuous Blood Gluc Sensor (FREESTYLE LIBRE 2 SENSOR) MISC Use every 14 days 2 each 1   empagliflozin (JARDIANCE) 10 MG TABS tablet Take 1 tablet (10 mg total) by mouth daily before breakfast. 30 tablet 3   FERROUS SULFATE PO Take 325 mg by mouth daily.     levothyroxine (SYNTHROID) 88 MCG tablet Take 88 mcg by mouth daily before breakfast.     Metoprolol Tartrate 75 MG TABS Take 1 tablet (75 mg total) by mouth 2 (two) times daily. 180 tablet 1   nitroGLYCERIN (NITROSTAT) 0.4 MG SL tablet Place 1 tablet (0.4 mg total) under the tongue every 5 (five) minutes as needed for chest pain. 25 tablet 11   pantoprazole (PROTONIX) 40 MG tablet Take 40 mg by mouth daily.     metFORMIN (GLUCOPHAGE) 500 MG tablet Take 1 tablet (500 mg total) by mouth 2 (two) times daily with a meal. 60 tablet 1   No current facility-administered medications for this visit.    REVIEW OF SYSTEMS:   Constitutional: ( - ) fevers, ( - )  chills , ( - ) night sweats Eyes: ( - ) blurriness of vision, ( - ) double vision, ( - ) watery eyes Ears, nose, mouth, throat, and face: ( - ) mucositis, ( - ) sore throat Respiratory: ( - ) cough, ( - ) dyspnea, ( - ) wheezes Cardiovascular: ( - ) palpitation, ( - ) chest discomfort, ( - ) lower extremity swelling Gastrointestinal:  ( - ) nausea, ( - ) heartburn, ( -) change in bowel habits Skin: ( - ) abnormal skin rashes Lymphatics: ( - ) new lymphadenopathy, ( - ) easy bruising Neurological: ( - ) numbness, ( - ) tingling, ( - ) new weaknesses Behavioral/Psych:  ( - ) mood change, ( - ) new changes  All other systems were reviewed with the patient and are negative.  PHYSICAL EXAMINATION: ECOG PERFORMANCE STATUS: 0 - Asymptomatic  Vitals:   12/07/22 1138  BP: 105/73  Pulse: 72  Resp: 18  Temp: 98.1 F (36.7 C)  SpO2: 98%   Filed Weights   12/07/22 1138  Weight: 178 lb 14.4 oz (81.1 kg)    GENERAL: well appearing male in NAD  SKIN: skin color, texture, turgor are normal, no rashes or significant lesions EYES: conjunctiva are pink and non-injected, sclera clear LUNGS: clear to auscultation and percussion with  normal breathing effort HEART: regular rate & rhythm and no murmurs and no lower extremity edema Musculoskeletal: no cyanosis of digits and no clubbing  PSYCH: alert & oriented x 3, fluent speech NEURO: no focal motor/sensory deficits  LABORATORY DATA:  I have reviewed the data as listed    Latest Ref Rng & Units 12/07/2022   10:56 AM 09/28/2022    9:32 AM 06/28/2022    9:52 AM  CBC  WBC 4.0 - 10.5 K/uL 6.8  8.2  7.2   Hemoglobin 13.0 - 17.0 g/dL 16.1  09.6  04.5   Hematocrit 39.0 - 52.0 % 49.4  44.7  48.0   Platelets 150 - 400 K/uL 179  226  256        Latest Ref Rng & Units 09/28/2022    9:32 AM 06/28/2022    9:52 AM 08/26/2020    9:46 AM  CMP  Glucose 70 - 99 mg/dL 409  811  914   BUN 6 - 20 mg/dL 10  17  12    Creatinine 0.61 - 1.24 mg/dL 7.82  9.56  2.13   Sodium 135 - 145 mmol/L 143  138  141   Potassium 3.5 - 5.1 mmol/L 3.9  4.1  4.2   Chloride 98 - 111 mmol/L 103  103  105   CO2 22 - 32 mmol/L 31  27  27    Calcium 8.9 - 10.3 mg/dL 8.8  8.7  8.9   Total Protein 6.5 - 8.1 g/dL 7.4  7.3  7.4   Total Bilirubin 0.3 - 1.2 mg/dL 0.7  0.8  0.5   Alkaline Phos 38 - 126 U/L 118  103  102   AST 15 - 41 U/L 21  17  17    ALT 0 - 44 U/L 20  17  15     ASSESSMENT & PLAN Shawn Landry is a 52 y.o. male who returns for a follow up for iron deficiency anemia  #Iron deficiency anemia, suspect secondary to GI bleed with  hemorrhoids --Patient is under the care of [facility]. EGD and colonoscopy were done on 08/18/2020. Findings included internal hemorrhoids, non-thrombosed external hemorrhoids.  --Patient was evaluated by general surgery (Dr.Steven Gross from [facility]) on 01/11/2021 to discuss hemorrhoidectomy. Patient has elected to hold off on surgical intervention.  --Last received IV venofer x 3 doses from 01/07/2022-01/21/2022.  --Labs today show no evidence of anemia with a hemoglobin of 16.0, MCV 85.0. Iron panel shows improvement with serum iron 90, saturation 22%, ferritin pending --No need for IV iron at this time.  --Recommend to continue to take ferrous sulfate 325 mg once daily with a source of vitamin C.  --RTC 6 months for a follow up visit.    No orders of the defined types were placed in this encounter.   All questions were answered. The patient knows to call the clinic with any problems, questions or concerns.  I have spent a total of 25 minutes minutes of face-to-face and non-face-to-face time, preparing to see the patient, performing a medically appropriate examination, counseling and educating the patient, documenting clinical information in the electronic health record, and care coordination.    Georga Kaufmann, PA-C Department of Hematology/Oncology [facility] at [facility] Phone: [phone]

## 2022-12-13 ENCOUNTER — Other Ambulatory Visit: Payer: Self-pay | Admitting: Cardiovascular Disease

## 2022-12-14 ENCOUNTER — Other Ambulatory Visit: Payer: 59

## 2022-12-14 ENCOUNTER — Encounter: Payer: Self-pay | Admitting: Physician Assistant

## 2022-12-14 ENCOUNTER — Ambulatory Visit: Payer: 59 | Admitting: Physician Assistant

## 2022-12-21 ENCOUNTER — Other Ambulatory Visit: Payer: Self-pay | Admitting: Cardiovascular Disease

## 2023-01-12 ENCOUNTER — Other Ambulatory Visit: Payer: Self-pay | Admitting: Cardiovascular Disease

## 2023-01-30 ENCOUNTER — Other Ambulatory Visit: Payer: Self-pay | Admitting: Cardiovascular Disease

## 2023-05-31 ENCOUNTER — Encounter: Payer: Self-pay | Admitting: Physician Assistant

## 2023-06-06 ENCOUNTER — Other Ambulatory Visit: Payer: Self-pay | Admitting: Physician Assistant

## 2023-06-06 DIAGNOSIS — D508 Other iron deficiency anemias: Secondary | ICD-10-CM

## 2023-06-07 ENCOUNTER — Inpatient Hospital Stay: Payer: 59 | Attending: Physician Assistant

## 2023-06-07 ENCOUNTER — Inpatient Hospital Stay (HOSPITAL_BASED_OUTPATIENT_CLINIC_OR_DEPARTMENT_OTHER): Payer: 59 | Admitting: Physician Assistant

## 2023-06-07 VITALS — BP 107/75 | HR 73 | Temp 97.7°F | Resp 17 | Wt 175.4 lb

## 2023-06-07 DIAGNOSIS — D509 Iron deficiency anemia, unspecified: Secondary | ICD-10-CM | POA: Diagnosis present

## 2023-06-07 DIAGNOSIS — D508 Other iron deficiency anemias: Secondary | ICD-10-CM

## 2023-06-07 LAB — CBC WITH DIFFERENTIAL (CANCER CENTER ONLY)
Abs Immature Granulocytes: 0.01 10*3/uL (ref 0.00–0.07)
Basophils Absolute: 0 10*3/uL (ref 0.0–0.1)
Basophils Relative: 1 %
Eosinophils Absolute: 0.2 10*3/uL (ref 0.0–0.5)
Eosinophils Relative: 3 %
HCT: 47.5 % (ref 39.0–52.0)
Hemoglobin: 15.6 g/dL (ref 13.0–17.0)
Immature Granulocytes: 0 %
Lymphocytes Relative: 23 %
Lymphs Abs: 1.6 10*3/uL (ref 0.7–4.0)
MCH: 27.8 pg (ref 26.0–34.0)
MCHC: 32.8 g/dL (ref 30.0–36.0)
MCV: 84.5 fL (ref 80.0–100.0)
Monocytes Absolute: 0.6 10*3/uL (ref 0.1–1.0)
Monocytes Relative: 9 %
Neutro Abs: 4.4 10*3/uL (ref 1.7–7.7)
Neutrophils Relative %: 64 %
Platelet Count: 202 10*3/uL (ref 150–400)
RBC: 5.62 MIL/uL (ref 4.22–5.81)
RDW: 13.6 % (ref 11.5–15.5)
WBC Count: 6.8 10*3/uL (ref 4.0–10.5)
nRBC: 0 % (ref 0.0–0.2)

## 2023-06-07 LAB — FERRITIN: Ferritin: 36 ng/mL (ref 24–336)

## 2023-06-07 LAB — IRON AND IRON BINDING CAPACITY (CC-WL,HP ONLY)
Iron: 56 ug/dL (ref 45–182)
Saturation Ratios: 13 % — ABNORMAL LOW (ref 17.9–39.5)
TIBC: 420 ug/dL (ref 250–450)
UIBC: 364 ug/dL (ref 117–376)

## 2023-06-07 NOTE — Progress Notes (Signed)
 [facility] [phone]   [phone]  PROGRESS NOTE  Patient Care Team: Rae Bugler, MD as PCP - General (Family Medicine) Loyde Rule, MD as PCP - Cardiology (Cardiology) Baldo Bonds, MD as Consulting Physician (Gastroenterology) Candyce Champagne, MD as Consulting Physician (General Surgery) Loyde Rule, MD as Consulting Physician (Cardiology)  Hematological/Oncological History 1) Labs from [facility] PA-C, [facility] -04/17/2020: WBC 11.2 (H), Hgb 9.8 (L), MCV 59.0, Plt 322. Started on iron  supplementation.  -06/02/2020: WBC 7.2, Hgb 11.2 (L), MCV 61.5 (L), Plt 247,  -06/18/2020: WBC 9.8, Hgb 11.0 (L), MCV 62.0 (L), Plt 265, Ferritin 4.8 (L), TIBC 524 (H), Iron  17 (L), Iron  saturation 3% (L), Transferrin 374 (H) -08/04/2020: WBC 7.4, Hgb 12.5 (L), MCV 66.4 (L), Plt 238, Ferritin 12.1 (L), TIBC 484 (H), Iron  20 (L), Iron  saturation 4% (L), Transferrin 346 (H)  2) 08/26/2020: Establish care with Wyline Hearing PA-C  3) 09/07/2020-8/08/222: Received IV venofer  x 3 doses.   4) 01/07/2022-01/21/2022: Received IV venofer  x 3 doses  CHIEF COMPLAINTS/PURPOSE OF CONSULTATION:  "Iron  deficiency anemia "  HISTORY OF PRESENTING ILLNESS:  Shawn Landry 53 y.o. male returns today for follow-up for iron  deficiency anemia. He was last seen on 12/07/2022. In the interim, he continues on PO iron  therapy. Patient is unaccompanied for this visit.    On exam today, Shawn Landry reports he is feeling well without any new or concerning symptoms. He is compliant with taking iron  pills without any side effects. He denies nausea, vomiting or bowel habit changes. He denies easy bruising or signs of bleeding. He stopped blood donation several months ago. He used to go for blood donations every few weeks. He denies any fevers, chills, night sweats, shortness of breath, chest pain or cough.  He has no other complaints.  Rest of the 10 point ROS is below.   MEDICAL  HISTORY:  Past Medical History:  Diagnosis Date   Anemia    Anxiety    Coronary artery disease    Diabetes mellitus without complication (HCC)    type 2   GERD (gastroesophageal reflux disease)    Heat intolerance    High cholesterol    History of heart attack    Hyperlipidemia    Neuropathy     SURGICAL HISTORY: Past Surgical History:  Procedure Laterality Date   CORONARY STENT INTERVENTION  07/09/2019   CORONARY STENT INTERVENTION N/A 07/09/2019   Procedure: CORONARY STENT INTERVENTION;  Surgeon: Arty Binning, MD;  Location: [facility];  Service: Cardiovascular;  Laterality: N/A;   LEFT HEART CATH AND CORONARY ANGIOGRAPHY N/A 07/09/2019   Procedure: LEFT HEART CATH AND CORONARY ANGIOGRAPHY;  Surgeon: Arty Binning, MD;  Location: [facility];  Service: Cardiovascular;  Laterality: N/A;   ROTATOR CUFF REPAIR Right    ROTATOR CUFF REPAIR Right 2010    SOCIAL HISTORY: Social History   Socioeconomic History   Marital status: Single    Spouse name: Not on file   Number of children: 0   Years of education: Not on file   Highest education level: Bachelor's degree (e.g., BA, AB, BS)  Occupational History    Comment: Sport and exercise psychologist  Tobacco Use   Smoking status: Former    Current packs/day: 0.00    Types: Cigarettes    Quit date: 2010    Years since quitting: 15.3   Smokeless tobacco: Never  Vaping Use   Vaping status: Never Used  Substance and  Sexual Activity   Alcohol use: Yes    Comment: rare   Drug use: Never   Sexual activity: Not on file  Other Topics Concern   Not on file  Social History Narrative   Lives alone   Caffeine 1-2 c daily   Social Drivers of Health   Financial Resource Strain: Not on file  Food Insecurity: Not on file  Transportation Needs: Not on file  Physical Activity: Not on file  Stress: Not on file  Social Connections: Not on file  Intimate Partner Violence: Not on file    FAMILY HISTORY: Family History   Problem Relation Age of Onset   Sudden Cardiac Death Brother 27   Breast cancer Mother     ALLERGIES:  has no known allergies.  MEDICATIONS:  Current Outpatient Medications  Medication Sig Dispense Refill   aspirin  EC 81 MG tablet Take 81 mg by mouth daily.     atorvastatin  (LIPITOR ) 80 MG tablet TAKE 1 TABLET BY MOUTH EVERY DAY 15 tablet 0   blood glucose meter kit and supplies Dispense based on patient and insurance preference. Use up to four times daily as directed. (FOR ICD-10 E10.9, E11.9). 1 each 0   Continuous Blood Gluc Sensor (FREESTYLE LIBRE 2 SENSOR) MISC Use every 14 days 2 each 1   empagliflozin  (JARDIANCE ) 10 MG TABS tablet Take 1 tablet (10 mg total) by mouth daily before breakfast. 30 tablet 3   FERROUS SULFATE PO Take 325 mg by mouth daily.     levothyroxine (SYNTHROID) 88 MCG tablet Take 88 mcg by mouth daily before breakfast.     Metoprolol  Tartrate 75 MG TABS TAKE 1 TABLET BY MOUTH TWICE A DAY 30 tablet 0   nitroGLYCERIN  (NITROSTAT ) 0.4 MG SL tablet Place 1 tablet (0.4 mg total) under the tongue every 5 (five) minutes as needed for chest pain. 25 tablet 11   pantoprazole  (PROTONIX ) 40 MG tablet Take 40 mg by mouth daily.     metFORMIN  (GLUCOPHAGE ) 500 MG tablet Take 1 tablet (500 mg total) by mouth 2 (two) times daily with a meal. 60 tablet 1   No current facility-administered medications for this visit.    REVIEW OF SYSTEMS:   Constitutional: ( - ) fevers, ( - )  chills , ( - ) night sweats Eyes: ( - ) blurriness of vision, ( - ) double vision, ( - ) watery eyes Ears, nose, mouth, throat, and face: ( - ) mucositis, ( - ) sore throat Respiratory: ( - ) cough, ( - ) dyspnea, ( - ) wheezes Cardiovascular: ( - ) palpitation, ( - ) chest discomfort, ( - ) lower extremity swelling Gastrointestinal:  ( - ) nausea, ( - ) heartburn, ( -) change in bowel habits Skin: ( - ) abnormal skin rashes Lymphatics: ( - ) new lymphadenopathy, ( - ) easy bruising Neurological: ( -  ) numbness, ( - ) tingling, ( - ) new weaknesses Behavioral/Psych: ( - ) mood change, ( - ) new changes  All other systems were reviewed with the patient and are negative.  PHYSICAL EXAMINATION: ECOG PERFORMANCE STATUS: 0 - Asymptomatic  Vitals:   06/07/23 0946  BP: 107/75  Pulse: 73  Resp: 17  Temp: 97.7 F (36.5 C)  SpO2: 95%   Filed Weights   06/07/23 0946  Weight: 175 lb 6.4 oz (79.6 kg)    GENERAL: well appearing male in NAD  SKIN: skin color, texture, turgor are normal, no rashes or significant lesions  EYES: conjunctiva are pink and non-injected, sclera clear LUNGS: clear to auscultation and percussion with normal breathing effort HEART: regular rate & rhythm and no murmurs and no lower extremity edema Musculoskeletal: no cyanosis of digits and no clubbing  PSYCH: alert & oriented x 3, fluent speech NEURO: no focal motor/sensory deficits  LABORATORY DATA:  I have reviewed the data as listed    Latest Ref Rng & Units 06/07/2023    8:56 AM 12/07/2022   10:56 AM 09/28/2022    9:32 AM  CBC  WBC 4.0 - 10.5 K/uL 6.8  6.8  8.2   Hemoglobin 13.0 - 17.0 g/dL 78.2  95.6  21.3   Hematocrit 39.0 - 52.0 % 47.5  49.4  44.7   Platelets 150 - 400 K/uL 202  179  226        Latest Ref Rng & Units 09/28/2022    9:32 AM 06/28/2022    9:52 AM 08/26/2020    9:46 AM  CMP  Glucose 70 - 99 mg/dL 086  578  469   BUN 6 - 20 mg/dL 10  17  12    Creatinine 0.61 - 1.24 mg/dL 6.29  5.28  4.13   Sodium 135 - 145 mmol/L 143  138  141   Potassium 3.5 - 5.1 mmol/L 3.9  4.1  4.2   Chloride 98 - 111 mmol/L 103  103  105   CO2 22 - 32 mmol/L 31  27  27    Calcium  8.9 - 10.3 mg/dL 8.8  8.7  8.9   Total Protein 6.5 - 8.1 g/dL 7.4  7.3  7.4   Total Bilirubin 0.3 - 1.2 mg/dL 0.7  0.8  0.5   Alkaline Phos 38 - 126 U/L 118  103  102   AST 15 - 41 U/L 21  17  17    ALT 0 - 44 U/L 20  17  15     ASSESSMENT & PLAN Shawn Landry is a 53 y.o. male who returns for a follow up for iron  deficiency  anemia  #Iron  deficiency anemia, suspect secondary to GI bleed with hemorrhoids --Patient is under the care of [facility]. EGD and colonoscopy were done on 08/18/2020. Findings included internal hemorrhoids, non-thrombosed external hemorrhoids.  --Patient was evaluated by general surgery (Dr.Steven Gross from [facility]) on 01/11/2021 to discuss hemorrhoidectomy. Patient has elected to hold off on surgical intervention.  --Last received IV venofer  x 3 doses from 01/07/2022-01/21/2022.  --Labs today show no evidence of anemia with a hemoglobin of 15.6, MCV 84.5 Iron  panel shows iron  56, saturation 13%, ferritin pending.  --No need for IV iron  at this time.  --Recommend to continue to take ferrous sulfate 325 mg once daily with a source of vitamin C.  --Plan to follow up with PCP moving forward to check iron  levels every 6-12 months. RTC if patient requires IV iron  in the future.    No orders of the defined types were placed in this encounter.   All questions were answered. The patient knows to call the clinic with any problems, questions or concerns.  I have spent a total of 25 minutes minutes of face-to-face and non-face-to-face time, preparing to see the patient, performing a medically appropriate examination, counseling and educating the patient, documenting clinical information in the electronic health record, and care coordination.    Wyline Hearing, PA-C Department of Hematology/Oncology [facility] Phone: [phone]

## 2023-06-08 ENCOUNTER — Encounter: Payer: Self-pay | Admitting: Physician Assistant
